# Patient Record
Sex: Male | Born: 1940 | Race: White | Hispanic: No | Marital: Married | Smoking: Former smoker
Health system: Southern US, Community
[De-identification: ages and names within clinical notes are randomized; demographics above are authoritative.]

## PROBLEM LIST (undated history)

## (undated) DIAGNOSIS — I350 Nonrheumatic aortic (valve) stenosis: Secondary | ICD-10-CM

## (undated) DIAGNOSIS — E785 Hyperlipidemia, unspecified: Secondary | ICD-10-CM

## (undated) DIAGNOSIS — Q249 Congenital malformation of heart, unspecified: Secondary | ICD-10-CM

## (undated) DIAGNOSIS — Z87442 Personal history of urinary calculi: Secondary | ICD-10-CM

## (undated) DIAGNOSIS — D039 Melanoma in situ, unspecified: Secondary | ICD-10-CM

## (undated) DIAGNOSIS — M1990 Unspecified osteoarthritis, unspecified site: Secondary | ICD-10-CM

## (undated) DIAGNOSIS — Z8546 Personal history of malignant neoplasm of prostate: Secondary | ICD-10-CM

## (undated) DIAGNOSIS — I639 Cerebral infarction, unspecified: Secondary | ICD-10-CM

## (undated) DIAGNOSIS — D229 Melanocytic nevi, unspecified: Secondary | ICD-10-CM

## (undated) DIAGNOSIS — C439 Malignant melanoma of skin, unspecified: Secondary | ICD-10-CM

## (undated) HISTORY — DX: Melanoma in situ, unspecified: D03.9

## (undated) HISTORY — DX: Hyperlipidemia, unspecified: E78.5

## (undated) HISTORY — DX: Malignant melanoma of skin, unspecified: C43.9

## (undated) HISTORY — PX: TONSILLECTOMY: SUR1361

## (undated) HISTORY — PX: JOINT REPLACEMENT: SHX530

## (undated) HISTORY — DX: Personal history of malignant neoplasm of prostate: Z85.46

## (undated) HISTORY — PX: COLONOSCOPY: SHX174

## (undated) HISTORY — DX: Melanocytic nevi, unspecified: D22.9

## (undated) HISTORY — PX: TOTAL KNEE ARTHROPLASTY: SHX125

---

## 1976-03-31 HISTORY — PX: VASECTOMY: SHX75

## 1999-04-01 DIAGNOSIS — C439 Malignant melanoma of skin, unspecified: Secondary | ICD-10-CM

## 1999-04-01 HISTORY — PX: OTHER SURGICAL HISTORY: SHX169

## 1999-04-01 HISTORY — DX: Malignant melanoma of skin, unspecified: C43.9

## 1999-04-23 DIAGNOSIS — D039 Melanoma in situ, unspecified: Secondary | ICD-10-CM

## 1999-04-23 HISTORY — DX: Melanoma in situ, unspecified: D03.9

## 1999-05-07 ENCOUNTER — Ambulatory Visit (HOSPITAL_COMMUNITY): Admission: RE | Admit: 1999-05-07 | Discharge: 1999-05-07 | Payer: Self-pay | Admitting: *Deleted

## 2000-06-09 ENCOUNTER — Ambulatory Visit (HOSPITAL_COMMUNITY): Admission: RE | Admit: 2000-06-09 | Discharge: 2000-06-09 | Payer: Self-pay | Admitting: *Deleted

## 2003-04-01 DIAGNOSIS — I639 Cerebral infarction, unspecified: Secondary | ICD-10-CM

## 2003-04-01 DIAGNOSIS — G459 Transient cerebral ischemic attack, unspecified: Secondary | ICD-10-CM

## 2003-04-01 HISTORY — DX: Cerebral infarction, unspecified: I63.9

## 2003-04-01 HISTORY — DX: Transient cerebral ischemic attack, unspecified: G45.9

## 2003-11-10 ENCOUNTER — Emergency Department (HOSPITAL_COMMUNITY): Admission: EM | Admit: 2003-11-10 | Discharge: 2003-11-11 | Payer: Self-pay | Admitting: Emergency Medicine

## 2004-01-01 ENCOUNTER — Inpatient Hospital Stay (HOSPITAL_COMMUNITY): Admission: EM | Admit: 2004-01-01 | Discharge: 2004-01-03 | Payer: Self-pay | Admitting: Emergency Medicine

## 2004-01-02 ENCOUNTER — Encounter (INDEPENDENT_AMBULATORY_CARE_PROVIDER_SITE_OTHER): Payer: Self-pay | Admitting: Cardiology

## 2004-01-30 ENCOUNTER — Ambulatory Visit (HOSPITAL_COMMUNITY): Admission: RE | Admit: 2004-01-30 | Discharge: 2004-01-30 | Payer: Self-pay | Admitting: Emergency Medicine

## 2004-02-15 ENCOUNTER — Ambulatory Visit (HOSPITAL_COMMUNITY): Admission: RE | Admit: 2004-02-15 | Discharge: 2004-02-15 | Payer: Self-pay | Admitting: Cardiovascular Disease

## 2004-07-12 ENCOUNTER — Ambulatory Visit (HOSPITAL_COMMUNITY): Admission: RE | Admit: 2004-07-12 | Discharge: 2004-07-12 | Payer: Self-pay | Admitting: Family Medicine

## 2004-07-23 ENCOUNTER — Encounter: Admission: RE | Admit: 2004-07-23 | Discharge: 2004-07-23 | Payer: Self-pay | Admitting: Family Medicine

## 2006-03-31 DIAGNOSIS — Z8546 Personal history of malignant neoplasm of prostate: Secondary | ICD-10-CM

## 2006-03-31 HISTORY — DX: Personal history of malignant neoplasm of prostate: Z85.46

## 2006-03-31 HISTORY — PX: PROSTATECTOMY: SHX69

## 2006-11-16 ENCOUNTER — Inpatient Hospital Stay (HOSPITAL_COMMUNITY): Admission: RE | Admit: 2006-11-16 | Discharge: 2006-11-17 | Payer: Self-pay | Admitting: Urology

## 2006-11-16 ENCOUNTER — Encounter: Payer: Self-pay | Admitting: Urology

## 2007-04-01 HISTORY — PX: KNEE ARTHROSCOPY: SUR90

## 2008-05-29 ENCOUNTER — Encounter: Admission: RE | Admit: 2008-05-29 | Discharge: 2008-05-29 | Payer: Self-pay | Admitting: Family Medicine

## 2010-03-31 HISTORY — PX: TOTAL KNEE ARTHROPLASTY: SHX125

## 2010-08-05 ENCOUNTER — Other Ambulatory Visit: Payer: Self-pay | Admitting: Orthopedic Surgery

## 2010-08-05 ENCOUNTER — Ambulatory Visit (HOSPITAL_COMMUNITY)
Admission: RE | Admit: 2010-08-05 | Discharge: 2010-08-05 | Disposition: A | Discharge status: Home / Self Care | Payer: Medicare Other | Source: Ambulatory Visit | Attending: Orthopedic Surgery | Admitting: Orthopedic Surgery

## 2010-08-05 ENCOUNTER — Encounter (HOSPITAL_COMMUNITY): Payer: Medicare Other

## 2010-08-05 ENCOUNTER — Other Ambulatory Visit (HOSPITAL_COMMUNITY): Payer: Self-pay | Admitting: Orthopedic Surgery

## 2010-08-05 DIAGNOSIS — Z8546 Personal history of malignant neoplasm of prostate: Secondary | ICD-10-CM | POA: Insufficient documentation

## 2010-08-05 DIAGNOSIS — M898X9 Other specified disorders of bone, unspecified site: Secondary | ICD-10-CM | POA: Insufficient documentation

## 2010-08-05 DIAGNOSIS — Z01812 Encounter for preprocedural laboratory examination: Secondary | ICD-10-CM | POA: Insufficient documentation

## 2010-08-05 DIAGNOSIS — Z87891 Personal history of nicotine dependence: Secondary | ICD-10-CM | POA: Insufficient documentation

## 2010-08-05 DIAGNOSIS — Z01818 Encounter for other preprocedural examination: Secondary | ICD-10-CM

## 2010-08-05 DIAGNOSIS — M1710 Unilateral primary osteoarthritis, unspecified knee: Secondary | ICD-10-CM | POA: Insufficient documentation

## 2010-08-05 LAB — URINALYSIS, ROUTINE W REFLEX MICROSCOPIC
Bilirubin Urine: NEGATIVE
Glucose, UA: NEGATIVE mg/dL
Hgb urine dipstick: NEGATIVE
Protein, ur: NEGATIVE mg/dL
Specific Gravity, Urine: 1.014 (ref 1.005–1.030)

## 2010-08-05 LAB — CBC
Hemoglobin: 14.3 g/dL (ref 13.0–17.0)
MCH: 34.6 pg — ABNORMAL HIGH (ref 26.0–34.0)
MCV: 100.7 fL — ABNORMAL HIGH (ref 78.0–100.0)

## 2010-08-05 LAB — COMPREHENSIVE METABOLIC PANEL
CO2: 30 mEq/L (ref 19–32)
Chloride: 102 mEq/L (ref 96–112)
GFR calc Af Amer: >60 mL/min (ref >60)
Potassium: 4.2 mEq/L (ref 3.5–5.1)
Total Bilirubin: 0.5 mg/dL (ref 0.3–1.2)

## 2010-08-05 LAB — SURGICAL PCR SCREEN: MRSA, PCR: NEGATIVE

## 2010-08-05 LAB — APTT: aPTT: 32 seconds (ref 24–37)

## 2010-08-12 ENCOUNTER — Inpatient Hospital Stay (HOSPITAL_COMMUNITY)
Admission: RE | Admit: 2010-08-12 | Discharge: 2010-08-15 | DRG: 470 | Disposition: A | Discharge status: Home Health | Payer: Medicare Other | Source: Ambulatory Visit | Attending: Orthopedic Surgery | Admitting: Orthopedic Surgery

## 2010-08-12 DIAGNOSIS — E785 Hyperlipidemia, unspecified: Secondary | ICD-10-CM | POA: Diagnosis present

## 2010-08-12 DIAGNOSIS — E871 Hypo-osmolality and hyponatremia: Secondary | ICD-10-CM | POA: Diagnosis not present

## 2010-08-12 DIAGNOSIS — Z85820 Personal history of malignant melanoma of skin: Secondary | ICD-10-CM

## 2010-08-12 DIAGNOSIS — Z01812 Encounter for preprocedural laboratory examination: Secondary | ICD-10-CM

## 2010-08-12 DIAGNOSIS — D649 Anemia, unspecified: Secondary | ICD-10-CM | POA: Diagnosis not present

## 2010-08-12 DIAGNOSIS — Z8673 Personal history of transient ischemic attack (TIA), and cerebral infarction without residual deficits: Secondary | ICD-10-CM

## 2010-08-12 DIAGNOSIS — Z8546 Personal history of malignant neoplasm of prostate: Secondary | ICD-10-CM

## 2010-08-12 DIAGNOSIS — M1710 Unilateral primary osteoarthritis, unspecified knee: Principal | ICD-10-CM | POA: Diagnosis present

## 2010-08-12 LAB — TYPE AND SCREEN: ABO/RH(D): O POS

## 2010-08-13 LAB — BASIC METABOLIC PANEL
BUN: 9 mg/dL (ref 6–23)
Calcium: 7.9 mg/dL — ABNORMAL LOW (ref 8.4–10.5)
Creatinine, Ser: 0.77 mg/dL (ref 0.4–1.5)
GFR calc Af Amer: >60 mL/min (ref >60)
GFR calc non Af Amer: >60 mL/min (ref >60)
Glucose, Bld: 133 mg/dL — ABNORMAL HIGH (ref 70–99)
Potassium: 3.8 mEq/L (ref 3.5–5.1)

## 2010-08-13 LAB — CBC
HCT: 31.4 % — ABNORMAL LOW (ref 39.0–52.0)
Hemoglobin: 10.7 g/dL — ABNORMAL LOW (ref 13.0–17.0)
MCH: 34.5 pg — ABNORMAL HIGH (ref 26.0–34.0)
MCHC: 34.1 g/dL (ref 30.0–36.0)
Platelets: 128 10*3/uL — ABNORMAL LOW (ref 150–400)
WBC: 10 10*3/uL (ref 4.0–10.5)

## 2010-08-13 NOTE — Op Note (Signed)
Jeffrey Ewing, Jeffrey Ewing               ACCOUNT NO.:  [id number]   MEDICAL RECORD NO.:  [id number]          PATIENT TYPE:  INP   LOCATION:  1430                         FACILITY:  [facility]   PHYSICIAN:  Excell Seltzer. Annabell Howells, M.D.    DATE OF BIRTH:  19-Jan-1941   DATE OF PROCEDURE:  11/16/2006  DATE OF DISCHARGE:                               OPERATIVE REPORT   PROCEDURE:  Robotic radical prostatectomy with bilateral pelvic lymph  node dissection and lysis of adhesions.   PREOPERATIVE DIAGNOSIS:  T1C Gleason 7 adenocarcinoma of prostate.   POSTOPERATIVE DIAGNOSIS:  T1C Gleason 7 adenocarcinoma of prostate.   SURGEON:  Dr. Bjorn Pippin.   ASSISTANT:  Dr. Crecencio Mc.   ANESTHESIA:  General.   SPECIMEN:  Prostate and seminal vesicle along with bilateral pelvic  lymph nodes.   DRAINS:  20-French Foley catheter and #10 Blake drain.   ESTIMATED BLOOD LOSS:  300 mL.   COMPLICATIONS:  None.   INDICATIONS:  Jeffrey Ewing is a 70 year old white male who was referred  for an elevated PSA.  He was found on biopsy and Gleason 7  adenocarcinoma of prostate involving the right and left base and the  left apex.  After discussion of the treatment options he elected radical  prostatectomy.   FINDINGS AND PROCEDURE:  The patient had undergone preoperative physical  therapy training as well as preoperative education via hospital.  He was  taken to operating room where he was given a gram of Ancef.  PAS hose  were placed.  He was placed on the operating table in the supine  position and general anesthetic was induced.  He was then moved into  lithotomy position with great care taken to pad all pressure points.  His abdomen was shaved and a red rubber rectal catheter was placed per  routine.  He was prepped with Betadine solution and then placed in steep  Trendelenburg per the routine positioning.  He was then draped in the  usual sterile fashion with an Ioban drape used to secure the remaining  drapes.   At  this point the periumbilical camera port was located by measuring 18  cm superior to the pubis just to the left side of the umbilicus a 2 cm  incision was made and the subcutaneous fat was spread with hemostats  following placement [employer] retractor to expose the anterior rectus  sheath.  This was opened with the Bovie.  The rectus muscle was parted  with a hemostat exposing the posterior sheath which was nicked with  scalpel.  A hemostat was then passed through the posterior sheath and  spread enlarging the opening in the peritoneal cavity.  A finger was  then placed to confirm adequate entry into the peritoneal cavity.  A 12  mm laparoscopy trocar was then placed and the incision was secured  around the trocar with a figure-of-eight 0 Vicryl.  The abdomen was then  insufflated at low volume fill, once insufflation was noted to proceed  well, the gas was increased to high flow.   At this point the three remaining robot port  sites and the 5 and 12 mm  working ports were then marked in the standard configuration.  After  initial placement of the camera prior to insertion of the ports  adhesions were noted in the left lower quadrant that were going to  impede placement of the left lateral robot port, so once the right ports  had been placed, the adhesions were taken down using sharp dissection.  The left ports were then inserted and at this point the robot was docked  in the routine fashion.   Once all ports were in position, and the robot was docked, the bladder  was filled with approximately 200 mL of sterile fluid and dissection was  initiated with division of the obliterated umbilical arteries and  incision in the anterior peritoneum.  The bladder was dissected off the  anterior abdominal wall entering the retropubic space.  The bladder was  then drained and the prostate was exposed. Initially the anterior  prostate was defatted.  We then turned our attention to the right   endopelvic fascia which was incised lateral to the prostate from the  base to apex.  The prostate was then dissected off the pelvic sidewall  out to the dorsal vein complex.  The puboprostatic ligaments were  divided using cautery and sharp dissection.  Once the right lateral  dissection was completed, we turned our attention to the left side of  where the endopelvic fascia was incised.  The prostate was dissected off  the lateral pelvic sidewall and the puboprostatic ligaments were  divided, allowing exposure of the dorsal vein complex.  Once appropriate  exposure of the dorsal vein complex had been achieved, the Endo-GIA was  used to divide the dorsal vein complex in a standard fashion.  Care was  taken to avoid urethral injury by manipulation of Foley throughout this  procedure.   Once the dorsal vein complex had been divided.  We turned our attention  to the bladder neck.  The Foley catheter balloon was used to aid  identification of the bladder neck which was then incised the using  cautery scissors.  Once the Foley catheter was exposed, it was brought  into the wound and used to provide anterior traction on the prostate.  The posterior bladder neck was then divided and dissection was carried  down to the ampulla of the vas with great care being taken to avoid  excessive thinness at the bladder neck posteriorly.  Once the vasa were  identified, the right vas was identified and dissected out and divided,  followed by the left vas.  These were then used to provide anterior  traction with the fourth arm.  The left seminal vesicle was dissected  out using cautery dissection.  This was followed by the light seminal  vesicles.  Once the seminal vesicles had been dissected out and grasped  with the fourth arm, Denonvilliers fascia was incised allowing  development of the space between the rectum and the prostate.  This  space was developed out to the apex and to the lateral aspects of the   prostate using the Prograsp.   Once this dissection then completed, we turned our attention to the  nerve-sparing component of the procedure.  The right nerve spare was  performed initially with incision of the periprostatic fascia allowing  development of plane between the neurovascular bundle and the prostate,  some large veins were encountered and one arterial bleeder was  controlled with bipolar but we were able to  successfully develop the  plane between the neurovascular bundle on the prostate from the apex  back to the base of the prostate.  This process was then repeated on the  left with a successful the neurovascular bundle dissection.   At this point the left prostatic pedicle was taken down using large Hem-  o-lok clips and residual apical attachments were divided down to the  urethra.  We then took down the right pedicle using Hem-o-lok clips with  care taken as on the left to avoid the neurovascular bundle.  Once again  the remaining apical attachments were taken down sharply to the apex of  the prostate.  The patient been given indigo carmine during the  procedure and blue urine was noted to efflux from the bladder neck area  as expected   At this point the residual dorsal vein complex was divided using the  cautery.  The urethra was divided anteriorly with sharp dissection, the  Foley was identified and withdrawn allowing division the posterior  urethra. Some residual rectourethralis attachments were taken down  apically and the prostate was freed from the pelvic fossa and moved out  of the field.  The pelvis was irrigated and the rectum was insufflated,  no evidence of rectal injury was identified.  Inspection of the pelvic  floor revealed no obvious significant bleeding.   At this point we turned our attention to the lymph node dissection.  The  left nodes were approached initially.  The fourth arm was used to divide  medial traction on the bladder while the  __________  dissector and  cautery scissors were used to perform the node dissection.  The iliac  vein was identified and the node package was developed off the iliac  vein out to the pelvic sidewall.  It was then swept medially allowing  identification of the obturator nerve.  Once the packet had been freed  from the sidewall, clips were placed on the proximal portion of the  lymphatic package and then dissection was carried down to the  bifurcation of the iliac artery where clips were used to control the  distal aspect of the packet.  The packet was then removed using a Super  grasper.  Inspection revealed no injury to the obturator nerve and no  active bleeding.   We then turned our attention to the right lymph node dissection.  The  peritoneum was incised more laterally allowing the more thorough  exposure of the obturator fossa.  The iliac vein was identified.  Once  again the packet was developed, the obturator nerve was identified, the  proximal portion of the packet was controlled with clips.  The  dissection was then carried down to the bifurcation of iliacs and clips  were placed on the distal portion of the packet.  The packet was then  removed once again with the Super grasper.  Inspection revealed an  intact obturator nerve and no active bleeding.   At this point we turned our attention to the urethral anastomosis.  Initially a 3-0 Vicryl was used to secure the rectourethralis muscle to  the cut edge of Denonvilliers fascia to relieve tension on the bladder  neck and reposition the proximal urethra back more superiorly within the  pelvis.  Once this stitch was secured, a second 3-0 Vicryl was placed  between the posterior urethra and the posterior bladder neck to provide  initial approximation.  Once this stitch was positioned, a 4-0 Monocryl  composed of both purple  and brown suture material was brought onto the  field.  The purple end was placed posteriorly using a double  throw to  secure the knot against the bladder neck tissue then a running  anastomosis was performed to complete the left side the anastomosis. The  remaining Monocryl strand was then run on the right side to complete the  anastomosis.  Once the stitches had been placed they were pulled up to  ensure a watertight anastomosis and a fresh 20-French coude Foley  catheter was inserted without difficulty into the bladder.  The suture  was then tied and not trimmed and the Foley balloon was filled.  Irrigation at this point revealed a watertight anastomosis.   At this point a #10 Blake drain was placed through the fourth right arm  port and port was removed.   The robot was undocked, an entrapment sac was placed through the camera  port while the camera was placed through the 12-mm working port and  grasping forceps was used to place the specimen within the entrapment  sac.  Specimen placed in the entrapment sac.  The camera port was then  reinserted alongside the entrapment sac string and the camera was  replaced in the camera port and the 12-mm working port was then removed  and closed using a suture passer and 2-0 Vicryl suture.  Once this was  secured, the remaining working ports removed under direct vision.  The  camera and camera port were then removed.  The camera port site was then  opened sufficiently to allow removal of the specimen and the anterior  rectus fascia was then closed using a running 0 Vicryl suture.  Once  this incision had been closed, all of the port sites were infiltrated  with lidocaine and closed with skin clips.  The drain was secured to the  skin using a 3-0 nylon suture.   At this point the Foley catheter was irrigated with return of clear  irrigant and no clots.  The catheter was placed to straight drainage.  The drapes were removed and dressings were applied to the port site.  The drain was placed to bulb suction.  The patient was taken down from  the  lithotomy position.  His anesthetic was reversed.  He was removed to  the recovery room in stable condition.  There were no complications  during the procedure.      Excell Seltzer. Annabell Howells, M.D.  Electronically Signed     JJW/MEDQ  D:  11/16/2006  T:  11/17/2006  Job:  [id number]

## 2010-08-13 NOTE — Op Note (Signed)
Jeffrey Ewing, Jeffrey Ewing               ACCOUNT NO.:  [id number]   MEDICAL RECORD NO.:  [id number]          PATIENT TYPE:  INP   LOCATION:  1430                         FACILITY:  [facility]   PHYSICIAN:  Excell Seltzer. Annabell Howells, M.D.    DATE OF BIRTH:  10/05/1940   DATE OF PROCEDURE:  11/16/2006  DATE OF DISCHARGE:                               OPERATIVE REPORT   PROCEDURE:  Robotic radical prostatectomy with bilateral pelvic lymph  node dissection and lysis of adhesions.   PREOPERATIVE DIAGNOSIS:  Prostate cancer.   POSTOPERATIVE DIAGNOSIS:  Prostate cancer.   SURGEON:  Dr. Bjorn Pippin.   ASSISTANT:  Dr. Crecencio Mc.   ANESTHESIA:  General.   SPECIMENS:  Prostate, seminal vesicles and bilateral pelvic lymph nodes.   DRAINS:  20-French coude Foley catheter and #10 Blake drain.   ESTIMATED BLOOD LOSS:  300 mL.   COMPLICATIONS:  Dictation ended at this point.      Excell Seltzer. Annabell Howells, M.D.     JJW/MEDQ  D:  11/16/2006  T:  11/17/2006  Job:  [id number]

## 2010-08-14 LAB — BASIC METABOLIC PANEL
BUN: 6 mg/dL (ref 6–23)
Calcium: 8 mg/dL — ABNORMAL LOW (ref 8.4–10.5)
Chloride: 101 mEq/L (ref 96–112)
Creatinine, Ser: 0.96 mg/dL (ref 0.4–1.5)
GFR calc Af Amer: >60 mL/min (ref >60)
GFR calc non Af Amer: >60 mL/min (ref >60)
Glucose, Bld: 118 mg/dL — ABNORMAL HIGH (ref 70–99)
Potassium: 3.9 mEq/L (ref 3.5–5.1)
Sodium: 134 mEq/L — ABNORMAL LOW (ref 135–145)

## 2010-08-14 LAB — CBC
Hemoglobin: 9.5 g/dL — ABNORMAL LOW (ref 13.0–17.0)
MCH: 34.9 pg — ABNORMAL HIGH (ref 26.0–34.0)
MCHC: 34.2 g/dL (ref 30.0–36.0)
MCV: 102.2 fL — ABNORMAL HIGH (ref 78.0–100.0)
RBC: 2.72 MIL/uL — ABNORMAL LOW (ref 4.22–5.81)

## 2010-08-14 NOTE — Op Note (Signed)
NAMEMOYSES, PAVEY               ACCOUNT NO.:  [id number]  MEDICAL RECORD NO.:  [id number]           PATIENT TYPE:  I  LOCATION:  0011                         FACILITY:  [facility]  PHYSICIAN:  Ollen Gross, M.D.    DATE OF BIRTH:  1941/01/20  DATE OF PROCEDURE:  08/12/2010 DATE OF DISCHARGE:                              OPERATIVE REPORT   PREOPERATIVE DIAGNOSIS:  Osteoarthritis, left knee.  POSTOPERATIVE DIAGNOSIS:  Osteoarthritis, left knee.  PROCEDURE:  Left total knee arthroplasty.  SURGEON:  Ollen Gross, M.D.  ASSISTANT:  Alexzandrew L. Perkins, P.A.C.  ANESTHESIA:  Spinal.  ESTIMATED BLOOD LOSS:  Minimal.  DRAIN:  Hemovac x1.  TOURNIQUET TIME:  39 minutes at 300 mmHg.  COMPLICATIONS:  None.  CONDITION:  Stable to recovery room.  BRIEF CLINICAL NOTE:  [name] is a 70 year old male with advanced end- stage arthritis of the left knee with progressively worsening pain and dysfunction.  He has failed nonoperative management and presents for total knee arthroplasty.  PROCEDURE IN DETAIL:  After successful administration of spinal anesthetic, a tourniquet was placed high on his left thigh and his left lower extremity was prepped and draped in the usual sterile fashion. Extremity was wrapped in Esmarch, knee flexed, tourniquet inflated to 300 mmHg.  Midline incision was made with a #10 blade through the subcutaneous tissue to the level of the extensor mechanism.  A fresh blade was used make a medial parapatellar arthrotomy.  Soft tissue on the proximal medial tibia subperiosteally elevated to the joint line with the knife and into the semimembranosus bursa with a Cobb elevator. Soft tissue laterally was elevated with attention being paid to avoid patellar tendon on tibial tubercle.  The patella was everted, knee flexed 90 degrees, and ACL and PCL removed.  Drill was used create a starting hole in the distal femur and the canal thoroughly irrigated.  A 5-degree  left valgus alignment guide was placed and distal femoral cutting block pinned to remove 11 mm off the distal femur.  Resection was made with an oscillating saw.  The tibia subluxed forward and the menisci were removed.  Extramedullary tibial alignment guide was placed referencing proximally at the medial aspect of the tibial tubercle and distally along the second metatarsal axis and tibial crest.  The block was pinned to remove 2 mm off the more deficient medial side.  Tibial resection was made with an oscillating saw.  Size 4 was the most appropriate tibial component.  The proximal tibia was prepared with a modular drill and keel punch for the size 4.  Femoral sizing guide was placed, size 4 was most appropriate on the femur.  Rotations was marked off the epicondylar axis and the rotation was confirmed by creating a rectangular flexion gap at 90 degrees.  The block was pinned in this rotation.  The anterior-posterior chamfer cuts were then made.  Intercondylar block was placed and that cut was made. The trial size 4 posterior stabilized femur was placed.  A 10-mm posterior stabilized rotating platform insert trial was placed.  There was a tiny bit of play with the 10, so went to  12.5 which allowed for full extension with excellent varus-valgus and anterior-posterior balance throughout full range of motion.  The patella was everted and thickness measured to 24 mm.  Freehand resection was taken to 14 mm, 38 template was placed, lug holes were drilled, trial patella was placed and it tracked normally.  Osteophytes were removed off the posterior femur with the trial in place.  All trials were removed and the cut bone surfaces prepared with pulsatile lavage.  Cement was mixed and once ready for implantation, size 4 mobile bearing tibial tray, size 4 posterior stabilized femur and 38 patella were cemented into place.  The patella was held with a clamp.  Trial 12.5-mm insert was placed,  knee held in full extension, all extruded cement removed.  When the cement had fully hardened, then the permanent 12.5-mm posterior stabilized rotating platform insert was placed in tibial tray.  Wound was copiously irrigated with saline solution and the arthrotomy closed over Hemovac drain with interrupted #1 PDS.  Flexion against gravity was 135 degrees, patella tracks normally.  Tourniquet was released after total time 39 minutes.  Subcu was closed with interrupted 2-0 Vicryl, subcuticular running 4-0 Monocryl.  Catheter for Marcaine pain pump was placed and pump initiated.  Incisions cleaned and dried and Steri-Strips and bulky sterile dressing were applied.  He was then placed into a knee immobilizer, awakened and transported to recovery in stable condition.     Ollen Gross, M.D.     FA/MEDQ  D:  08/12/2010  T:  08/12/2010  Job:  [id number]  Electronically Signed by Ollen Gross M.D. on 08/14/2010 10:13:46 AM

## 2010-08-14 NOTE — H&P (Addendum)
NAMEMARKEITH, [name]               ACCOUNT NO.:  [id number]  MEDICAL RECORD NO.:  [id number]           PATIENT TYPE:  I  LOCATION:  1621                         FACILITY:  [facility]  PHYSICIAN:  Ollen Gross, M.D.    DATE OF BIRTH:  [birth year]  DATE OF ADMISSION:  08/12/2010 DATE OF DISCHARGE:  04/19/2010                             HISTORY & PHYSICAL   CHIEF COMPLAINT:  Left knee pain.  BRIEF HISTORY:  Jeffrey Ewing came in to see Dr. Lequita Halt as a second opinion in February.  He states that he was then having worsening pain in the left knee for about 4 years.  He has had injections in the past including cortisone and Euflexxa and he actually has pain in both knees, the left is more symptomatic than the right.  At this point, he states the left knee is limiting what he is able to do and he now presents for a left total knee arthroplasty.  MEDICATION ALLERGIES:  No known drug allergies.  PRIMARY CARE PHYSICIAN:  Jeffrey Ewing, M.D.  UROLOGIST:  Jeffrey Ewing, M.D.  CURRENT MEDICATIONS: 1. Aggrenox which he will discontinue 5 days prior to surgery. 2. Crestor. 3. Ibuprofen. 4. Xanax.  PAST MEDICAL HISTORY: 1. End-stage arthritis of both knees, left worse than right. 2. History of TIAs in 2005. 3. Hyperlipidemia. 4. History of kidney stones in 2005 and again in 2011. 5. History of prostate cancer.  He had surgery in 2008. 6. History of melanoma. 7. Arthritis.  PAST SURGICAL HISTORY:  Prostate cancer surgery, July of 2008.  FAMILY HISTORY:  Father passed at age of 60, he had coronary occlusion. Mother passed at age of 80, she had Alzheimer's and then had complications from a fall.  SOCIAL HISTORY:  The patient is married.  He is retired.  He admits past use of tobacco products.  He does chew Nicorette gum regularly.  He has 3 children.  He lives at home with his wife.  He does plan to go home following his hospital stay and wife is lined up to be his caregiver.  REVIEW  OF SYSTEMS:  GENERAL:  Negative for fevers, chills or weight change.  HEENT/NEURO:  Negative for headache or blurred vision. DERMATOLOGIC:  Negative for rash or lesion.  RESPIRATORY:  Negative for shortness of breath at rest or with exertion.  CARDIOVASCULAR:  Negative for chest pain or palpitations.  GI:  Negative for nausea, vomiting or diarrhea.  GU:  Negative for hematuria, dysuria.  Does have history of kidney stones.  MUSCULOSKELETAL:  Positive for joint pain, muscular pain and muscle weakness.  The patient has been cleared for surgery by his primary care physician Dr. Elias Else.  PHYSICAL EXAMINATION:  VITAL SIGNS:  Pulse 80, respirations 18, blood pressure 120/78 in the left arm. GENERAL:  Jeffrey Ewing is alert and oriented x3.  He is well-developed, well-nourished, in no apparent distress.Marland Kitchen  He is a pleasant 70 year old male who is at a stated height of 5 feet 10 inches and stated weight of 196 pounds. HEENT:  Normocephalic, atraumatic.  Extraocular movements intact.  The patient is wearing  glasses. NECK:  Supple.  Full range of motion without lymphadenopathy. CHEST:  Lungs are clear to auscultation bilaterally without wheezes, rhonchi or rales. HEART:  Regular rate and rhythm without murmur. ABDOMEN:  Bowel sounds present in all 4 quadrants.  Abdomen is soft and nontender to palpation. EXTREMITIES:  Left knee negative for effusion, slight varus deformity, tender along the medial joint line.  Range is 5 to 125 degrees.  Marked crepitus is noted throughout the range of motion. SKIN:  Unremarkable. NEUROLOGIC:  Intact.  Peripheral vascular, carotid pulses 2+ bilaterally without bruit.  RADIOGRAPHS:  AP and lateral views of the left knee reveal bone-on-bone in medial compartment with patellofemoral changes, much worse in the left than the right knee.  IMPRESSION:  End-stage arthritis of both knees, worse on the left than the right.  PLAN:  Left total knee  arthroplasty.     Rozell Searing, PAC   ______________________________ Ollen Gross, M.D.    LD/MEDQ  D:  08/13/2010  T:  08/13/2010  Job:  [id number]  Electronically Signed by Ollen Gross M.D. on 08/14/2010 10:13:50 AM Electronically Signed by Rozell Searing  on 08/15/2010 10:43:48 AM

## 2010-08-15 LAB — CBC
MCHC: 34.1 g/dL (ref 30.0–36.0)
RBC: 2.77 MIL/uL — ABNORMAL LOW (ref 4.22–5.81)
RDW: 13.4 % (ref 11.5–15.5)
WBC: 12.3 10*3/uL — ABNORMAL HIGH (ref 4.0–10.5)

## 2010-08-15 LAB — BASIC METABOLIC PANEL
CO2: 27 mEq/L (ref 19–32)
Calcium: 8.1 mg/dL — ABNORMAL LOW (ref 8.4–10.5)
Chloride: 99 mEq/L (ref 96–112)
GFR calc Af Amer: >60 mL/min (ref >60)
Glucose, Bld: 125 mg/dL — ABNORMAL HIGH (ref 70–99)
Potassium: 3.7 mEq/L (ref 3.5–5.1)
Sodium: 131 mEq/L — ABNORMAL LOW (ref 135–145)

## 2010-08-16 NOTE — H&P (Signed)
[name], [name]               ACCOUNT NO.:  [id number]   MEDICAL RECORD NO.:  [id number]          PATIENT TYPE:  INP   LOCATION:  1823                         FACILITY:  [facility]   PHYSICIAN:  Pramod P. Pearlean Brownie, MD    DATE OF BIRTH:  11-17-1940   DATE OF ADMISSION:  01/01/2004  DATE OF DISCHARGE:                                HISTORY & PHYSICAL   REFERRING PHYSICIAN:  Lorre Ewing, M.D.   REASON FOR ADMISSION:  TIA.   HISTORY OF PRESENT ILLNESS:  [name] is a 70 year old Caucasian male who  woke up this morning not feeling right, and weak.  The patient himself  denies specific focal weakness.  However, his wife, who was available at the  bedside, states she noticed that he could not make a grip with his left hand  and when he was getting dressed he was unable to move his left foot to dress  himself.  He was also off-balance and needed some help to walk.  His  symptoms resolved quickly.  By the time she got her car out and brought the  patient to the hospital, he was able to walk all right without assistance  and move his left hand as well.  The patient states he has had some physical  exertion in the last few weeks as they have recently moved into a new house  and has often woke up not feeling well and feeling weak, but today was  different.  He denies any slurred speech, headache, blurred vision, vertigo,  or numbness.  He has a previous history of ocular infarct 5 years ago and he  developed some visual loss in the right eye.  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  He had some mild right eye scotoma at the 11 o'clock  position as a residue from that.  He was placed on aspirin, as well as  started on Foltx for stroke prevention.  He had been taking them regularly  but had recently stopped the Foltx because of the move.   PAST MEDICAL HISTORY:  Fairly unremarkable except for hyperlipidemia and  smoking for 30+ years.   MEDICATION ALLERGIES:  None.   HOME MEDICATIONS:  1.  Aspirin 325 mg a day.  2.  Lipitor 20 mg a day.  3.  Foltx once a day, but recently discontinued.   REVIEW OF SYSTEMS:  Not significant for any recent chest pain, shortness of  breath, cough, diarrhea, weight loss, weight gain, decreased appetite.  His  family physician is Dr. Nicholos Johns with [facility].   SOCIAL HISTORY:  The patient is married, lives with his wife in [place].  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  He smokes three packs per week for 30+ years, does not drink.   PHYSICAL EXAMINATION:  GENERAL:  Reveals a pleasant Caucasian male who is  not in distress.  VITAL SIGNS:  He is afebrile, pulse rate 78 per minute, regular respirations  rate.  Distal  pulses well felt, blood pressure 130/98, and no extremity  vascular deformity noted.  HEENT:  Head is nontraumatic.  Neck is supple without bruit.  ENT exam  unremarkable.  CARDIAC:  No murmur or gallop.  LUNGS:  Clear to auscultation.  NEUROLOGIC:  The patient is awake, alert, oriented x3, with normal speech  and language function.  There is no aphasia, apraxia, or dysarthria.  Pupils  are equal, reactive to light and accommodation.  Face is symmetric, palate  elevates normally, tongue is midline.  The patient has only a small visual  scotoma in the right eye at the 11 o'clock position but has good visual  acuity in fields.  Motor system exam reveals symmetric strength, tone,  reflexes, coordination, sensation.  Finger-to-nose coordination is accurate.  Gait was not tested.   DATA REVIEW:  Noncontrast CAT scan of the head done today is normal without  any acute abnormality.  EKG reveals normal sinus rhythm with any acute  ischemic findings.  Admission labs are pending.   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   PLAN:  The patient will be admitted to the stroke service for stroke risk  stratification evaluation.  We will discontinue aspirin and change him to  Aggrenox one capsule daily for the first 2 weeks, to be increased to twice a  day.  Use Tylenol p.r.n. for headache if necessary.  Obtain MRI scan of the  brain with MRA of the brain.  The patient is claustrophobic and hence will  sedate him with Ativan prior to the MRI.  Check carotid ultrasound,  transcranial Doppler studies, a 2-D echocardiogram, fasting lipid profile,  hemoglobin A1c, homocysteine levels.  Continue Lipitor for his  hyperlipidemia.  I had a long discussion with the patient and his wife  regarding the nature of his symptoms.  Discussed with them the plan for  evaluation, treatment, and answered questions.       PPS/MEDQ  D:  01/01/2004  T:  01/01/2004  Job:  [id number]   cc:   Molly Maduro A. Nicholos Johns, M.D.  [address]  [place]  [place] [address]  Fax: [phone]

## 2010-08-16 NOTE — Discharge Summary (Signed)
Jeffrey Ewing, Jeffrey Ewing               ACCOUNT NO.:  [id number]   MEDICAL RECORD NO.:  [id number]          PATIENT TYPE:  INP   LOCATION:  3019                         FACILITY:  [facility]   PHYSICIAN:  Annie Main, N.P.      DATE OF BIRTH:  1941-03-11   DATE OF ADMISSION:  01/01/2004  DATE OF DISCHARGE:  01/03/2004                                 DISCHARGE SUMMARY   DISCHARGE DIAGNOSES:  1.  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  2.  Smoker.  3.  Dyslipidemia.  4.  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  5.  Mild atrioseptal aneurysm.   MEDICATIONS ON DISCHARGE:  1.  Aggrenox one p.o. daily x14 days then increase to b.i.d.  2.  Lipitor 20 mg daily.  3.  Foltx one daily.   STUDIES PERFORMED:  1.  CT of the head on admission shows no acute abnormality.  2.  EKG reveals normal sinus rhythm without any acute ischemic findings.  3.  MRI shows scattered foci of acute to subacute infarction in the right      occipital region and right posterior parietal region consistent with      embolic disease.  4.  MRA of the brain is negative.  5.  Transthoracic echocardiogram shows EF of 55 to 65%.  Cannot clearly      identify any left ventricular regional wall motion abnormalities.      Possibility of PFO cannot be excluded.  No vegetation noted.  In the      parasternal short axis view, the mitral leaflet appeared somewhat      thickened, but there was not a typical vegetation.  TEE recommended if      clinically relevant.  6.  TEE performed by Dr. Dionicio Stall revealed mild atrioseptal aneurysm      positive, negative jet pad, positive bubble study, likely intracardiac      shunt, likely PFO.  MRI of the heart was recommended to further      delineate anatomy.  7.  Carotid Doppler was normal.  8.  Transcranial Doppler was performed; results pending at time of      discharge.   LABORATORY STUDIES:  Homocystine 8.04, cholesterol 184, triglycerides 144,  HDL 43, LDL 112.   Urine drug screen negative.  CBC normal, differential  normal.  Chemistry normal except for elevated glucose at 126.  Liver  functions normal.  Hemoglobin A1c normal at 6.   HISTORY OF PRESENT ILLNESS:  Mr. Servellon is a 70 year old, right-handed,  white male with a history of dyslipidemia and smoking, who woke the morning  of admission not feeling quite right and weak.  The patient denies specific  focal weakness; however, the wife, who was a nurse and at the bedside,  noticed he could not make a grip with his left hand while he was getting  dressed, and he was unable to move his left foot to dress himself.  He was  off balance and needed some help to walk.  His symptoms resolved quickly.  She put him in the car and  brought him to the hospital, and he was able to  walk in without assistance and move his left hand well.  The patient felt  that he had had some physical exertion in the past few weeks and that that  was what his symptoms were related to.  He does have a history of an ocular  infarct five years and developed some visual loss in the right eye.  He saw  a neurologist at that time and had extensive workup.  He had a mild right  eye scotoma at the 11 o'clock position as a residue from that.  He was  placed on aspirin and started on Foltx.  He has been taking those regularly  along with Lipitor until the past two weeks where he has not taken his Foltx  or Lipitor.  The patient was admitted to the hospital for further stroke  evaluation.   HOSPITAL COURSE:  MRI did reveal two acute infarcts in the right parietal  and right occipital regions.  It looked embolic.  A 2-D echocardiogram was  essentially unrevealing, and a TEE did reveal a questionable PFO with a  positive bubble study and a mild atrioseptal aneurysm.  This was performed  by Dr. Dorethea Clan, who recommended a followup cardiac MRI.  At this time, Dr.  Pearlean Brownie does not prefer to follow with an MRI, but may consider this as an   outpatient.  He will discuss closure with the patient in the future after  doing a followup TCD bubble study in his office.   Other risk factors identified were dyslipidemia for which the patient has  been on Lipitor long term.  He was recently just increased to a 20 mg dose,  but has also skipped Lipitor for the past two weeks without clear-cut  reasoning.  We will go ahead and leave Lipitor at 20 mg a day, follow up in  six to eight weeks, and if remains elevated will increase to 40, though LDL  is less than 100.  The patient is also a smoker, and smoking cessation  counseling was given.  Diet and exercise were also discussed with the  patient.  Current stroke is felt to be secondary to small vessel disease,  not embolic source, per Dr. Pearlean Brownie.   CONDITION AT DISCHARGE:  Patient alert and oriented x3.  Speech clear.  No  aphasia.  Visual fields full.  No arm drift.  Strength is normal.  He has  mild decreased left upper extremity finger tap.  Grip has improved.  He has  no satelliting.   DISCHARGE PLAN:  1.  Discharge home with wife.  No therapies needed.  2.  Aggrenox for secondary stroke prevention.  3.  Lipitor 20 mg daily.  Follow up in six to eight weeks.  If LDL not less      than 100, increase to 40 mg daily.  4.  Transcranial Doppler with bubble study scheduled for October 17th with      Dr. Delia Heady.  5.  Follow up with Dr. Pearlean Brownie in two months.  Need to discuss patent foramen      ovale, question cardiac MRI, question closure, question Coumadin.       SB/MEDQ  D:  01/04/2004  T:  01/04/2004  Job:  [id number]   cc:   Pramod P. Pearlean Brownie, MD  Fax: [phone]   Vida Roller, M.D.  Fax: [phone]   Elana Alm. Nicholos Johns, M.D.  [address]  [place] [address]  Fax: [phone]

## 2010-09-11 NOTE — Discharge Summary (Signed)
NAMEGRACIANO, Jeffrey Ewing               ACCOUNT NO.:  [id number]  MEDICAL RECORD NO.:  [id number]           PATIENT TYPE:  I  LOCATION:  1621                         FACILITY:  [facility]  PHYSICIAN:  Ollen Gross, M.D.    DATE OF BIRTH:  1940-05-30  DATE OF ADMISSION:  08/12/2010 DATE OF DISCHARGE:  08/15/2010                              DISCHARGE SUMMARY   ADMITTING DIAGNOSES: 1. End-stage osteoarthritis, bilateral knees, left worse than right. 2. History of transient ischemic attack. 3. Hyperlipidemia. 4. History of renal calculi. 5. History of prostate cancer. 6. History of melanoma. 7. Osteoarthritis.  DISCHARGE DIAGNOSES: 1. Osteoarthritis, left knee, status post left total knee replacement     arthroplasty. 2. Mild postoperative acute blood loss anemia. 3. Mild postoperative hyponatremia. 4. History of transient ischemic attack. 5. Hyperlipidemia. 6. History of renal calculi. 7. History of prostate cancer. 8. History of melanoma. 9. Osteoarthritis.  PROCEDURE:  Aug 12, 2010, left total knee.  Surgeon, Dr. Lequita Halt. Assistant, Alexzandrew L. Perkins, P.A.C.  Spinal anesthesia.  TOURNIQUET TIME:  39 minutes.  CONSULTS:  None.  BRIEF HISTORY:  The patient is a 70 year old male with advanced arthritis, end-stage, of left knee; progressive worsening pain and dysfunction; failed nonoperative management; now presents for total knee arthroplasty.  LABORATORY DATA:  Preop CBC was not scanned into the chart.  Serial hemoglobins were followed.  Hemoglobin was 10.1 on day #1, down to 9.5 where it stabilized.  Last noted H and H of 9.5 and 27.9.  Chem panel not found in the chart.  Serial BMETs were followed.  Sodium did drop from 135 to 131.  Remaining electrolytes remained within normal limits.  CONSULTS:  None.  HOSPITAL COURSE:  The patient was admitted to [facility], taken to OR, underwent above-stated procedure without complication.  The patient tolerated the  procedure well, later transferred to the recovery room on orthopedic floor, started on p.o. and IV analgesic pain control following surgery.  The Aggrenox for TIAs was on hold.  The patient was placed on Xarelto for DVT prophylaxis, started back on home meds.  Blood pressure was a little soft following surgery, so gave the patient fluids to help out with the pressures.  Pulse was good.  Hemovac drain placed on the surgery was pulled.  Started getting up out of bed, weightbearing as tolerated.  Noted on day #2, the patient had progressed, sodium was down a little bit, hemoglobin was down to 9.5 but asymptomatic.  Had a little bit of temp, encouraged incentive spirometer and pulmonary toilet.  Dressing changed.  Incision looked good.  Hemoglobin was 9.5, put him on iron supplementation.  Continued to slowly progress with therapy and by day #3, hemoglobin was stable at 9.5.  Sodium was down a little bit to 131, would recheck on outpatient basis.  Incision was healing well, progressing with therapy and discharged home. DISCHARGE/PLAN: 1. The patient was discharged home on Aug 15, 2010. 2. Discharge diagnoses please see above. 3. Discharge medications:  Nu-Iron, Robaxin, OxyIR, and Xarelto.     Continue  home meds, Crestor.  DIET:  Heart healthy diet.  ACTIVITY:  Weightbearing as tolerated.  Total knee protocol.  FOLLOWUP:  Two weeks.  DISPOSITION:  Home.  CONDITION ON DISCHARGE:  Improved.     Alexzandrew L. Julien Girt, P.A.C.   ______________________________ Ollen Gross, M.D.    ALP/MEDQ  D:  08/29/2010  T:  08/29/2010  Job:  [id number]  Electronically Signed by Patrica Duel P.A.C. on 09/05/2010 10:42:47 AM Electronically Signed by Ollen Gross M.D. on 09/11/2010 03:23:17 PM

## 2011-01-10 LAB — ABO/RH: ABO/RH(D): O POS

## 2011-01-13 LAB — BASIC METABOLIC PANEL
BUN: 12
CO2: 27
Calcium: 9.9
Chloride: 103
Creatinine, Ser: 1.08
Glucose, Bld: 99
Potassium: 3.9
Sodium: 141

## 2011-08-18 ENCOUNTER — Encounter: Payer: Self-pay | Admitting: Internal Medicine

## 2011-08-29 ENCOUNTER — Encounter: Payer: Self-pay | Admitting: Internal Medicine

## 2011-11-05 ENCOUNTER — Encounter: Payer: Self-pay | Admitting: Internal Medicine

## 2011-11-05 ENCOUNTER — Ambulatory Visit (AMBULATORY_SURGERY_CENTER): Payer: Medicare Other | Admitting: *Deleted

## 2011-11-05 VITALS — Ht 69.5 in | Wt 202.8 lb

## 2011-11-05 DIAGNOSIS — Z1211 Encounter for screening for malignant neoplasm of colon: Secondary | ICD-10-CM

## 2011-11-05 MED ORDER — MOVIPREP 100 G PO SOLR: ORAL | Status: DC

## 2011-11-19 ENCOUNTER — Ambulatory Visit (AMBULATORY_SURGERY_CENTER): Payer: Medicare Other | Admitting: Internal Medicine

## 2011-11-19 ENCOUNTER — Encounter: Payer: Self-pay | Admitting: Internal Medicine

## 2011-11-19 VITALS — BP 138/86 | HR 95 | Temp 98.9°F | Resp 19 | Ht 69.5 in | Wt 202.0 lb

## 2011-11-19 DIAGNOSIS — D126 Benign neoplasm of colon, unspecified: Secondary | ICD-10-CM

## 2011-11-19 DIAGNOSIS — Z1211 Encounter for screening for malignant neoplasm of colon: Secondary | ICD-10-CM

## 2011-11-19 MED ORDER — SODIUM CHLORIDE 0.9 % IV SOLN: 500.0000 mL | INTRAVENOUS | Status: DC

## 2011-11-19 NOTE — Progress Notes (Signed)
Patient did not experience any of the following events: a burn prior to discharge; a fall within the facility; wrong site/side/patient/procedure/implant event; or a hospital transfer or hospital admission upon discharge from the facility. (G8907)Patient did not have preoperative order for IV antibiotic SSI prophylaxis. (G 

## 2011-11-19 NOTE — Op Note (Signed)
[facility] [address] [place], [address]   COLONOSCOPY PROCEDURE REPORT  PATIENT: Jeffrey Ewing, Jeffrey Ewing  MR#: [id number] BIRTHDATE: [birth year] , 71  yrs. old GENDER: Male ENDOSCOPIST: Hart Carwin, MD REFERRED BY:  Elias Else, M.D. PROCEDURE DATE:  11/19/2011 PROCEDURE:   Colonoscopy with snare polypectomy and Colonoscopy with cold biopsy polypectomy ASA CLASS:   Class II INDICATIONS:average risk patient for colon cancer and last colon 2003 was normal. MEDICATIONS: MAC sedation, administered by CRNA and Propofol (Diprivan) 300 mg IV  DESCRIPTION OF PROCEDURE:   After the risks and benefits and of the procedure were explained, informed consent was obtained.  A digital rectal exam revealed no abnormalities of the rectum.    The LB CF-[id number]  endoscope was introduced through the anus and advanced to the cecum, which was identified by both the appendix and ileocecal valve .  The quality of the prep was good, using MoviPrep .  The instrument was then slowly withdrawn as the colon was fully examined.     COLON FINDINGS: One sessile polyps measuring 15 mm in size were found at the cecum.  A polypectomy was performed using snare cautery.  polyp removed in 2 pieces  , second polyp 5 mm removed with biopsies, at 50 cm  . Moderately severe diverticulosis of the entire colon, predominantely in the left colon, First grade hemorrhoids present  Retroflexed views revealed no abnormalities. The scope was then withdrawn from the patient and the procedure completed.  COMPLICATIONS: There were no complications. ENDOSCOPIC IMPRESSION: Two sessile polyps measuring 15 mm in size were found at the cecum; polypectomy was performed using snare cautery Moderately severe diverticulosis of the left and the right colon First grade hemorrhoids  RECOMMENDATIONS: 1.  Await pathology results 2.  High fiber diet.   REPEAT EXAM: In 5 year(s)  for  Colonoscopy.  cc:  _______________________________ eSignedHart Carwin, MD 11/19/2011 9:16 AM     PATIENT NAME:  [name], [name] MR#: [id number]

## 2011-11-19 NOTE — Patient Instructions (Addendum)

## 2011-11-20 ENCOUNTER — Telehealth: Payer: Self-pay | Admitting: *Deleted

## 2011-11-20 NOTE — Telephone Encounter (Signed)
  Follow up Call-  Call back number 11/19/2011  Post procedure Call Back phone  # [phone] hm  Permission to leave phone message Yes     Patient questions:  Do you have a fever, pain , or abdominal swelling? no Pain Score  0 *  Have you tolerated food without any problems? yes  Have you been able to return to your normal activities? yes  Do you have any questions about your discharge instructions: Diet   no Medications  no Follow up visit  no  Do you have questions or concerns about your Care? no  Actions: * If pain score is 4 or above: No action needed, pain <4.

## 2011-12-01 ENCOUNTER — Encounter: Payer: Self-pay | Admitting: Internal Medicine

## 2011-12-30 HISTORY — PX: COLONOSCOPY W/ POLYPECTOMY: SHX1380

## 2012-12-07 ENCOUNTER — Other Ambulatory Visit: Payer: Self-pay | Admitting: Orthopedic Surgery

## 2012-12-07 NOTE — Progress Notes (Signed)
Preoperative surgical orders have been place into the Epic hospital system for Jeffrey Ewing on 12/07/2012, 5:06 PM  by Patrica Duel for surgery on 12/20/2012.  Preop Total Knee orders including Experal, PO Tylenol, and IV Decadron as long as there are no contraindications to the above medications. Avel Peace, PA-C

## 2012-12-10 ENCOUNTER — Encounter (HOSPITAL_COMMUNITY): Payer: Self-pay | Admitting: Pharmacy Technician

## 2012-12-14 ENCOUNTER — Encounter (HOSPITAL_COMMUNITY): Payer: Self-pay

## 2012-12-14 ENCOUNTER — Ambulatory Visit (HOSPITAL_COMMUNITY)
Admission: RE | Admit: 2012-12-14 | Discharge: 2012-12-14 | Disposition: A | Discharge status: Home / Self Care | Payer: Medicare Other | Source: Ambulatory Visit | Attending: Orthopedic Surgery | Admitting: Orthopedic Surgery

## 2012-12-14 ENCOUNTER — Encounter (HOSPITAL_COMMUNITY)
Admission: RE | Admit: 2012-12-14 | Discharge: 2012-12-14 | Disposition: A | Discharge status: Home / Self Care | Payer: Medicare Other | Source: Ambulatory Visit | Attending: Orthopedic Surgery | Admitting: Orthopedic Surgery

## 2012-12-14 DIAGNOSIS — M1710 Unilateral primary osteoarthritis, unspecified knee: Secondary | ICD-10-CM | POA: Insufficient documentation

## 2012-12-14 DIAGNOSIS — Z01810 Encounter for preprocedural cardiovascular examination: Secondary | ICD-10-CM | POA: Insufficient documentation

## 2012-12-14 DIAGNOSIS — Z01818 Encounter for other preprocedural examination: Secondary | ICD-10-CM | POA: Insufficient documentation

## 2012-12-14 DIAGNOSIS — M8448XA Pathological fracture, other site, initial encounter for fracture: Secondary | ICD-10-CM | POA: Insufficient documentation

## 2012-12-14 DIAGNOSIS — IMO0002 Reserved for concepts with insufficient information to code with codable children: Secondary | ICD-10-CM | POA: Insufficient documentation

## 2012-12-14 DIAGNOSIS — Z01812 Encounter for preprocedural laboratory examination: Secondary | ICD-10-CM | POA: Insufficient documentation

## 2012-12-14 HISTORY — DX: Cerebral infarction, unspecified: I63.9

## 2012-12-14 HISTORY — DX: Personal history of urinary calculi: Z87.442

## 2012-12-14 HISTORY — DX: Unspecified osteoarthritis, unspecified site: M19.90

## 2012-12-14 HISTORY — DX: Congenital malformation of heart, unspecified: Q24.9

## 2012-12-14 LAB — URINALYSIS, ROUTINE W REFLEX MICROSCOPIC
Nitrite: NEGATIVE
Protein, ur: NEGATIVE mg/dL
Specific Gravity, Urine: 1.019 (ref 1.005–1.030)
Urobilinogen, UA: 0.2 mg/dL (ref 0.0–1.0)

## 2012-12-14 LAB — COMPREHENSIVE METABOLIC PANEL
Albumin: 4.1 g/dL (ref 3.5–5.2)
BUN: 20 mg/dL (ref 6–23)
Calcium: 9.6 mg/dL (ref 8.4–10.5)
Creatinine, Ser: 0.97 mg/dL (ref 0.50–1.35)
GFR calc Af Amer: >90 mL/min (ref >90)
Glucose, Bld: 102 mg/dL — ABNORMAL HIGH (ref 70–99)
Total Protein: 6.8 g/dL (ref 6.0–8.3)

## 2012-12-14 LAB — SURGICAL PCR SCREEN: MRSA, PCR: NEGATIVE

## 2012-12-14 LAB — CBC
HCT: 42.6 % (ref 39.0–52.0)
Hemoglobin: 14.3 g/dL (ref 13.0–17.0)
MCH: 34.7 pg — ABNORMAL HIGH (ref 26.0–34.0)
MCHC: 33.6 g/dL (ref 30.0–36.0)
MCV: 103.4 fL — ABNORMAL HIGH (ref 78.0–100.0)
RDW: 14 % (ref 11.5–15.5)

## 2012-12-14 LAB — PROTIME-INR
INR: 0.99 (ref 0.00–1.49)
Prothrombin Time: 12.9 seconds (ref 11.6–15.2)

## 2012-12-14 LAB — APTT: aPTT: 30 seconds (ref 24–37)

## 2012-12-14 NOTE — Progress Notes (Signed)
Surgery clearance note Dr. Nicholos Johns 08/05/12 on chart

## 2012-12-14 NOTE — Patient Instructions (Addendum)
[address] [facility]  12/14/2012   Your procedure is scheduled on: 12/20/12  Report to [facility] at 9:40 AM.  Call this number if you have problems the morning of surgery [phone]: [phone]   Remember:   Do not eat food or drink liquids After Midnight.     Take these medicines the morning of surgery with A SIP OF WATER: crestor   Do not wear jewelry, make-up or nail polish.  Do not wear lotions, powders, or perfumes. You may wear deodorant.  Do not shave 48 hours prior to surgery. Men may shave face and neck.  Do not bring valuables to the hospital.  Contacts, dentures or bridgework may not be worn into surgery.  Leave suitcase in the car. After surgery it may be brought to your room.  For patients admitted to the hospital, checkout time is 11:00 AM the day of discharge.    Please read over the following fact sheets that you were given: MRSA Information, incentive spirometry fact sheet, blood fact sheet Birdie Sons, RN  pre op nurse call if needed [phone]    FAILURE TO FOLLOW THESE INSTRUCTIONS MAY RESULT IN CANCELLATION OF YOUR SURGERY   Patient Signature: ___________________________________________

## 2012-12-19 ENCOUNTER — Other Ambulatory Visit: Payer: Self-pay | Admitting: Orthopedic Surgery

## 2012-12-19 NOTE — H&P (Signed)
Jeffrey Ewing  DOB: 09-19-1940 Married / Language: English / Race: White Male  Date of Admission:  12/20/2012  Chief Complaint:  Right Knee Pain  History of Present Illness The patient is a 72 year old male who comes in for a preoperative History and Physical. The patient is scheduled for a right total knee arthroplasty to be performed by Dr. Gus Rankin. Aluisio, MD at [facility] on 12/20/2012. Jeffrey Ewing is a 72 year old male in for check of his right knee and discussion of knee replacement surgery. He is about two years out from his left total knee and doing great with that. He is not having any problems at all with the left knee. He was seen here in July for his right knee. He was having considerable pain and dysfunction and also had bone on bone change. Discussed treatment options and he wanted to proceed with total knee arthroplasty. He is scheduled in September and here today to discuss it. The knee is hurting all the time. It is limiting what he can and cannot do. He has a lot of problems with functional activities, even sometimes with activities of daily living now. He can do things, but it is getting harder to do it and more painful to do it. He is ready to get the knee fixed. They have been treated conservatively in the past for the above stated problem and despite conservative measures, they continue to have progressive pain and severe functional limitations and dysfunction. They have failed non-operative management including home exercise, medications. It is felt that they would benefit from undergoing total joint replacement. Risks and benefits of the procedure have been discussed with the patient and they elect to proceed with surgery. There are no active contraindications to surgery such as ongoing infection or rapidly progressive neurological disease.   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 01/28/2011   Family History Hypertension. father Heart Disease. father   Social History Marital status. married Living situation. live with spouse Pain Contract. no Number of flights of stairs before winded. 2-3 Drug/Alcohol Rehab (Previously). no Drug/Alcohol Rehab (Currently). no Illicit drug use. no Exercise. Exercises weekly; does running / walking and gym / weights Tobacco use. Never smoker. smoke(d) 1 pack(s) per day former smoker; smoke(d) 3/4 pack(s) per day Tobacco / smoke exposure. no Current work status. retired Copywriter, advertising. 3 Alcohol use. Occasional alcohol use. current drinker; drinks beer and wine; only occasionally per week   Medication History Aggrenox (25-200MG  Capsule ER 12HR, Oral) Active. Crestor (20MG  Tablet, Oral) Active. Ibuprofen (200MG  Capsule, 1 (one) Oral) Active.   Past Surgical History Vasectomy Total Knee Replacement. left Arthroscopy of Knee. left Colon Polyp Removal - Colonoscopy Prostatectomy; Abdominal. Date: 2008.   Medical History Cerebrovascular Accident. 2005 Hypercholesterolemia Kidney Stone Prostate Disease Prostate Cancer Skin Cancer Tear, medial meniscus, knee, current (836.0). 07/03/1998 Osteoarthrosis NOS, lower leg (715.96)    Review of Systems General:Not Present- Chills, Fever, Night Sweats, Fatigue, Weight Gain, Weight Loss and Memory Loss. Skin:Not Present- Hives, Itching, Rash, Eczema and Lesions. HEENT:Not Present- Tinnitus, Headache, Double Vision, Visual Loss, Hearing Loss and Dentures. Respiratory:Not Present- Shortness of breath with exertion, Shortness of breath at rest, Allergies, Coughing up blood and Chronic Cough. Cardiovascular:Not Present- Chest Pain, Racing/skipping heartbeats, Difficulty Breathing Lying Down, Murmur, Swelling and Palpitations. Gastrointestinal:Not Present- Bloody Stool, Heartburn, Abdominal Pain,  Vomiting, Nausea, Constipation, Diarrhea, Difficulty Swallowing, Jaundice and Loss  of appetitie. Musculoskeletal:Present- Muscle Weakness and Joint Pain. Not Present- Muscle Pain, Joint Swelling, Back Pain, Morning Stiffness and Spasms. Neurological:Not Present- Tremor, Dizziness, Blackout spells, Paralysis, Difficulty with balance and Weakness. Psychiatric:Not Present- Insomnia.   Vitals Weight: 187 lb Height: 70 in Weight was reported by patient. Height was reported by patient. Body Surface Area: 2.05 m Body Mass Index: 26.83 kg/m Pulse: 64 (Regular) Resp.: 14 (Unlabored) BP: 128/72 (Sitting, Right Arm, Standard)    Physical Exam The physical exam findings are as follows:   General Mental Status - Alert, cooperative and good historian. General Appearance- pleasant. Not in acute distress. Orientation- Oriented X3. Build & Nutrition- Well nourished and Well developed.   Head and Neck Head- normocephalic, atraumatic . Neck Global Assessment- supple. no bruit auscultated on the right and no bruit auscultated on the left.   Eye Pupil- Bilateral- Regular and Round. Motion- Bilateral- EOMI.   Chest and Lung Exam Auscultation: Breath sounds:- clear at anterior chest wall and - clear at posterior chest wall. Adventitious sounds:- No Adventitious sounds.   Cardiovascular Auscultation:Rhythm- Regular rate and rhythm. Heart Sounds- S1 WNL and S2 WNL. Murmurs & Other Heart Sounds:Auscultation of the heart reveals - No Murmurs.   Abdomen Palpation/Percussion:Tenderness- Abdomen is non-tender to palpation. Rigidity (guarding)- Abdomen is soft. Auscultation:Auscultation of the abdomen reveals - Bowel sounds normal.   Musculoskeletal On exam well developed male, alert, and oriented in no apparent distress. His left knee looks great. There is no swelling. Range about 5 to 125 with no instability. Right knee no effusion. Marked  crepitus on range of motion. Range about 5 to 125. Tender medial greater than lateral with no instability.  RADIOGRAPHS: The left knee replacement is in excellent position with no abnormalities. The right knee is bone on bone medial and patellofemoral.  Assessment & Plan Primary osteoarthritis of one knee (715.16) Impression: Right Knee  Note: Plan is for a Right Total Knee Replacement by Dr. Lequita Halt.  Plan is to go home.  PCP - Dr. Nicholos Johns - Patient has been seen preoperatively and felt to be stable for surgery.  The patient will not receive TXA (tranexamic acid) due to: Stroke  Signed electronically by Lauraine Rinne, III PA-C

## 2012-12-20 ENCOUNTER — Inpatient Hospital Stay (HOSPITAL_COMMUNITY)
Admission: RE | Admit: 2012-12-20 | Discharge: 2012-12-22 | DRG: 470 | Disposition: A | Discharge status: Home Health | Payer: Medicare Other | Source: Ambulatory Visit | Attending: Orthopedic Surgery | Admitting: Orthopedic Surgery

## 2012-12-20 ENCOUNTER — Encounter (HOSPITAL_COMMUNITY)
Admission: RE | Disposition: A | Discharge status: Home Health | Payer: Self-pay | Source: Ambulatory Visit | Attending: Orthopedic Surgery

## 2012-12-20 ENCOUNTER — Inpatient Hospital Stay (HOSPITAL_COMMUNITY): Payer: Medicare Other | Admitting: Anesthesiology

## 2012-12-20 ENCOUNTER — Encounter (HOSPITAL_COMMUNITY): Payer: Self-pay | Admitting: Anesthesiology

## 2012-12-20 ENCOUNTER — Encounter (HOSPITAL_COMMUNITY): Payer: Self-pay | Admitting: *Deleted

## 2012-12-20 DIAGNOSIS — Z87442 Personal history of urinary calculi: Secondary | ICD-10-CM

## 2012-12-20 DIAGNOSIS — Z8673 Personal history of transient ischemic attack (TIA), and cerebral infarction without residual deficits: Secondary | ICD-10-CM

## 2012-12-20 DIAGNOSIS — Z8546 Personal history of malignant neoplasm of prostate: Secondary | ICD-10-CM

## 2012-12-20 DIAGNOSIS — Z79899 Other long term (current) drug therapy: Secondary | ICD-10-CM

## 2012-12-20 DIAGNOSIS — E785 Hyperlipidemia, unspecified: Secondary | ICD-10-CM | POA: Diagnosis present

## 2012-12-20 DIAGNOSIS — D62 Acute posthemorrhagic anemia: Secondary | ICD-10-CM | POA: Diagnosis not present

## 2012-12-20 DIAGNOSIS — Z791 Long term (current) use of non-steroidal anti-inflammatories (NSAID): Secondary | ICD-10-CM

## 2012-12-20 DIAGNOSIS — Z96651 Presence of right artificial knee joint: Secondary | ICD-10-CM

## 2012-12-20 DIAGNOSIS — M179 Osteoarthritis of knee, unspecified: Secondary | ICD-10-CM | POA: Diagnosis present

## 2012-12-20 DIAGNOSIS — M1710 Unilateral primary osteoarthritis, unspecified knee: Principal | ICD-10-CM | POA: Diagnosis present

## 2012-12-20 DIAGNOSIS — E7800 Pure hypercholesterolemia, unspecified: Secondary | ICD-10-CM | POA: Diagnosis present

## 2012-12-20 DIAGNOSIS — Z85820 Personal history of malignant melanoma of skin: Secondary | ICD-10-CM

## 2012-12-20 HISTORY — PX: TOTAL KNEE ARTHROPLASTY: SHX125

## 2012-12-20 LAB — TYPE AND SCREEN
ABO/RH(D): O POS
Antibody Screen: NEGATIVE

## 2012-12-20 SURGERY — ARTHROPLASTY, KNEE, TOTAL
Anesthesia: Spinal | Site: Knee | Laterality: Right | Wound class: Clean

## 2012-12-20 MED ORDER — LACTATED RINGERS IV SOLN
INTRAVENOUS | Status: DC
  Administered 2012-12-20: 1000 mL via INTRAVENOUS

## 2012-12-20 MED ORDER — CEFAZOLIN SODIUM-DEXTROSE 2-3 GM-% IV SOLR
2.0000 g | INTRAVENOUS | Status: AC
  Administered 2012-12-20: 2 g via INTRAVENOUS

## 2012-12-20 MED ORDER — BISACODYL 10 MG RE SUPP: 10.0000 mg | Freq: Every day | RECTAL | Status: DC | PRN

## 2012-12-20 MED ORDER — ACETAMINOPHEN 500 MG PO TABS
1000.0000 mg | ORAL_TABLET | Freq: Four times a day (QID) | ORAL | Status: AC
  Administered 2012-12-20 – 2012-12-21 (×3): 1000 mg via ORAL
  Filled 2012-12-20 (×4): qty 2

## 2012-12-20 MED ORDER — OXYCODONE HCL 5 MG PO TABS
5.0000 mg | ORAL_TABLET | ORAL | Status: DC | PRN
  Administered 2012-12-20 – 2012-12-22 (×11): 10 mg via ORAL
  Administered 2012-12-22: 5 mg via ORAL
  Administered 2012-12-22: 10 mg via ORAL
  Filled 2012-12-20 (×13): qty 2

## 2012-12-20 MED ORDER — BUPIVACAINE LIPOSOME 1.3 % IJ SUSP
20.0000 mL | Freq: Once | INTRAMUSCULAR | Status: DC
  Filled 2012-12-20: qty 20

## 2012-12-20 MED ORDER — METHOCARBAMOL 500 MG PO TABS
500.0000 mg | ORAL_TABLET | Freq: Four times a day (QID) | ORAL | Status: DC | PRN
  Administered 2012-12-20 – 2012-12-22 (×5): 500 mg via ORAL
  Filled 2012-12-20 (×5): qty 1

## 2012-12-20 MED ORDER — ASPIRIN-DIPYRIDAMOLE ER 25-200 MG PO CP12
1.0000 | ORAL_CAPSULE | Freq: Two times a day (BID) | ORAL | Status: DC
  Administered 2012-12-21 – 2012-12-22 (×3): 1 via ORAL
  Filled 2012-12-20 (×4): qty 1

## 2012-12-20 MED ORDER — DEXTROSE-NACL 5-0.9 % IV SOLN
INTRAVENOUS | Status: DC
  Administered 2012-12-20: 16:00:00 via INTRAVENOUS
  Administered 2012-12-21: 20 mL/h via INTRAVENOUS

## 2012-12-20 MED ORDER — POLYETHYLENE GLYCOL 3350 17 G PO PACK: 17.0000 g | PACK | Freq: Every day | ORAL | Status: DC | PRN

## 2012-12-20 MED ORDER — LIDOCAINE HCL (CARDIAC) 20 MG/ML IV SOLN
INTRAVENOUS | Status: DC | PRN
  Administered 2012-12-20: 100 mg via INTRAVENOUS

## 2012-12-20 MED ORDER — ONDANSETRON HCL 4 MG/2ML IJ SOLN
INTRAMUSCULAR | Status: DC | PRN
  Administered 2012-12-20: 4 mg via INTRAVENOUS

## 2012-12-20 MED ORDER — SODIUM CHLORIDE 0.9 % IV SOLN: INTRAVENOUS | Status: DC

## 2012-12-20 MED ORDER — RIVAROXABAN 10 MG PO TABS
10.0000 mg | ORAL_TABLET | Freq: Every day | ORAL | Status: DC
  Administered 2012-12-21 – 2012-12-22 (×2): 10 mg via ORAL
  Filled 2012-12-20 (×3): qty 1

## 2012-12-20 MED ORDER — PHENOL 1.4 % MT LIQD
1.0000 | OROMUCOSAL | Status: DC | PRN
  Filled 2012-12-20: qty 177

## 2012-12-20 MED ORDER — BUPIVACAINE IN DEXTROSE 0.75-8.25 % IT SOLN
INTRATHECAL | Status: DC | PRN
  Administered 2012-12-20: 1.8 mL via INTRATHECAL

## 2012-12-20 MED ORDER — SODIUM CHLORIDE 0.9 % IR SOLN
Status: DC | PRN
  Administered 2012-12-20: 1000 mL

## 2012-12-20 MED ORDER — FENTANYL CITRATE 0.05 MG/ML IJ SOLN: 25.0000 ug | INTRAMUSCULAR | Status: DC | PRN

## 2012-12-20 MED ORDER — CEFAZOLIN SODIUM-DEXTROSE 2-3 GM-% IV SOLR
INTRAVENOUS | Status: AC
  Filled 2012-12-20: qty 50

## 2012-12-20 MED ORDER — DIPHENHYDRAMINE HCL 12.5 MG/5ML PO ELIX: 12.5000 mg | ORAL_SOLUTION | ORAL | Status: DC | PRN

## 2012-12-20 MED ORDER — SODIUM CHLORIDE 0.9 % IJ SOLN
INTRAMUSCULAR | Status: AC
  Filled 2012-12-20: qty 50

## 2012-12-20 MED ORDER — DEXAMETHASONE SODIUM PHOSPHATE 10 MG/ML IJ SOLN
10.0000 mg | Freq: Every day | INTRAMUSCULAR | Status: AC
  Filled 2012-12-20: qty 1

## 2012-12-20 MED ORDER — PHENYLEPHRINE HCL 10 MG/ML IJ SOLN
INTRAMUSCULAR | Status: DC | PRN
  Administered 2012-12-20: 80 ug via INTRAVENOUS

## 2012-12-20 MED ORDER — ONDANSETRON HCL 4 MG/2ML IJ SOLN: 4.0000 mg | Freq: Four times a day (QID) | INTRAMUSCULAR | Status: DC | PRN

## 2012-12-20 MED ORDER — FENTANYL CITRATE 0.05 MG/ML IJ SOLN
INTRAMUSCULAR | Status: DC | PRN
  Administered 2012-12-20: 100 ug via INTRAVENOUS

## 2012-12-20 MED ORDER — STERILE WATER FOR IRRIGATION IR SOLN
Status: DC | PRN
  Administered 2012-12-20: 1500 mL

## 2012-12-20 MED ORDER — PROPOFOL INFUSION 10 MG/ML OPTIME
INTRAVENOUS | Status: DC | PRN
  Administered 2012-12-20: 160 ug/kg/min via INTRAVENOUS

## 2012-12-20 MED ORDER — ONDANSETRON HCL 4 MG PO TABS
4.0000 mg | ORAL_TABLET | Freq: Four times a day (QID) | ORAL | Status: DC | PRN
  Administered 2012-12-21: 14:00:00 4 mg via ORAL
  Filled 2012-12-20: qty 1

## 2012-12-20 MED ORDER — DEXAMETHASONE 6 MG PO TABS
10.0000 mg | ORAL_TABLET | Freq: Every day | ORAL | Status: AC
  Administered 2012-12-21: 10 mg via ORAL
  Filled 2012-12-20: qty 1

## 2012-12-20 MED ORDER — SODIUM CHLORIDE 0.9 % IJ SOLN
INTRAMUSCULAR | Status: DC | PRN
  Administered 2012-12-20: 30 mL

## 2012-12-20 MED ORDER — DEXTROSE 5 % IV SOLN
500.0000 mg | Freq: Four times a day (QID) | INTRAVENOUS | Status: DC | PRN
  Administered 2012-12-20: 500 mg via INTRAVENOUS
  Filled 2012-12-20 (×2): qty 5

## 2012-12-20 MED ORDER — MENTHOL 3 MG MT LOZG
1.0000 | LOZENGE | OROMUCOSAL | Status: DC | PRN
  Filled 2012-12-20: qty 9

## 2012-12-20 MED ORDER — CEFAZOLIN SODIUM 1-5 GM-% IV SOLN
1.0000 g | Freq: Four times a day (QID) | INTRAVENOUS | Status: AC
  Administered 2012-12-20 – 2012-12-21 (×2): 1 g via INTRAVENOUS
  Filled 2012-12-20 (×2): qty 50

## 2012-12-20 MED ORDER — BUPIVACAINE LIPOSOME 1.3 % IJ SUSP
INTRAMUSCULAR | Status: DC | PRN
  Administered 2012-12-20 (×2): 20 mL

## 2012-12-20 MED ORDER — DEXAMETHASONE SODIUM PHOSPHATE 10 MG/ML IJ SOLN
10.0000 mg | Freq: Once | INTRAMUSCULAR | Status: AC
  Administered 2012-12-20: 10 mg via INTRAVENOUS

## 2012-12-20 MED ORDER — METOCLOPRAMIDE HCL 5 MG PO TABS
5.0000 mg | ORAL_TABLET | Freq: Three times a day (TID) | ORAL | Status: DC | PRN
  Filled 2012-12-20: qty 2

## 2012-12-20 MED ORDER — MIDAZOLAM HCL 5 MG/5ML IJ SOLN
INTRAMUSCULAR | Status: DC | PRN
  Administered 2012-12-20: 2 mg via INTRAVENOUS

## 2012-12-20 MED ORDER — TRAMADOL HCL 50 MG PO TABS
50.0000 mg | ORAL_TABLET | Freq: Four times a day (QID) | ORAL | Status: DC | PRN
  Administered 2012-12-21: 100 mg via ORAL
  Filled 2012-12-20: qty 2

## 2012-12-20 MED ORDER — PROMETHAZINE HCL 25 MG/ML IJ SOLN: 6.2500 mg | INTRAMUSCULAR | Status: DC | PRN

## 2012-12-20 MED ORDER — METOCLOPRAMIDE HCL 5 MG/ML IJ SOLN: 5.0000 mg | Freq: Three times a day (TID) | INTRAMUSCULAR | Status: DC | PRN

## 2012-12-20 MED ORDER — EPHEDRINE SULFATE 50 MG/ML IJ SOLN
INTRAMUSCULAR | Status: DC | PRN
  Administered 2012-12-20: 5 mg via INTRAVENOUS

## 2012-12-20 MED ORDER — BUPIVACAINE HCL 0.25 % IJ SOLN
INTRAMUSCULAR | Status: DC | PRN
  Administered 2012-12-20: 20 mL

## 2012-12-20 MED ORDER — ACETAMINOPHEN 500 MG PO TABS
1000.0000 mg | ORAL_TABLET | Freq: Once | ORAL | Status: AC
  Administered 2012-12-20: 1000 mg via ORAL
  Filled 2012-12-20: qty 2

## 2012-12-20 MED ORDER — FLEET ENEMA 7-19 GM/118ML RE ENEM: 1.0000 | ENEMA | Freq: Once | RECTAL | Status: AC | PRN

## 2012-12-20 MED ORDER — MORPHINE SULFATE 10 MG/ML IJ SOLN: 1.0000 mg | INTRAMUSCULAR | Status: DC | PRN

## 2012-12-20 MED ORDER — 0.9 % SODIUM CHLORIDE (POUR BTL) OPTIME
TOPICAL | Status: DC | PRN
  Administered 2012-12-20: 1000 mL

## 2012-12-20 MED ORDER — BUPIVACAINE HCL (PF) 0.25 % IJ SOLN
INTRAMUSCULAR | Status: AC
  Filled 2012-12-20: qty 30

## 2012-12-20 MED ORDER — DOCUSATE SODIUM 100 MG PO CAPS
100.0000 mg | ORAL_CAPSULE | Freq: Two times a day (BID) | ORAL | Status: DC
  Administered 2012-12-20 – 2012-12-22 (×5): 100 mg via ORAL

## 2012-12-20 SURGICAL SUPPLY — 57 items
BAG SPEC THK2 15X12 ZIP CLS (MISCELLANEOUS) ×1
BAG ZIPLOCK 12X15 (MISCELLANEOUS) ×2 IMPLANT
BANDAGE ELASTIC 6 VELCRO ST LF (GAUZE/BANDAGES/DRESSINGS) ×2 IMPLANT
BANDAGE ESMARK 6X9 LF (GAUZE/BANDAGES/DRESSINGS) ×1 IMPLANT
BLADE SAG 18X100X1.27 (BLADE) ×2 IMPLANT
BLADE SAW SGTL 11.0X1.19X90.0M (BLADE) ×2 IMPLANT
BNDG CMPR 9X6 STRL LF SNTH (GAUZE/BANDAGES/DRESSINGS) ×1
BNDG ESMARK 6X9 LF (GAUZE/BANDAGES/DRESSINGS) ×2
BOWL SMART MIX CTS (DISPOSABLE) ×2 IMPLANT
CAPT RP KNEE ×1 IMPLANT
CEMENT HV SMART SET (Cement) ×4 IMPLANT
CLOTH BEACON ORANGE TIMEOUT ST (SAFETY) ×2 IMPLANT
CUFF TOURN SGL QUICK 34 (TOURNIQUET CUFF) ×2
CUFF TRNQT CYL 34X4X40X1 (TOURNIQUET CUFF) ×1 IMPLANT
DECANTER SPIKE VIAL GLASS SM (MISCELLANEOUS) ×2 IMPLANT
DRAPE EXTREMITY T 121X128X90 (DRAPE) ×2 IMPLANT
DRAPE POUCH INSTRU U-SHP 10X18 (DRAPES) ×2 IMPLANT
DRAPE U-SHAPE 47X51 STRL (DRAPES) ×2 IMPLANT
DRSG ADAPTIC 3X8 NADH LF (GAUZE/BANDAGES/DRESSINGS) ×2 IMPLANT
DRSG PAD ABDOMINAL 8X10 ST (GAUZE/BANDAGES/DRESSINGS) ×2 IMPLANT
DURAPREP 26ML APPLICATOR (WOUND CARE) ×2 IMPLANT
ELECT REM PT RETURN 9FT ADLT (ELECTROSURGICAL) ×2
ELECTRODE REM PT RTRN 9FT ADLT (ELECTROSURGICAL) ×1 IMPLANT
EVACUATOR 1/8 PVC DRAIN (DRAIN) ×2 IMPLANT
FACESHIELD LNG OPTICON STERILE (SAFETY) ×10 IMPLANT
GLOVE BIO SURGEON STRL SZ7.5 (GLOVE) IMPLANT
GLOVE BIO SURGEON STRL SZ8 (GLOVE) ×2 IMPLANT
GLOVE BIOGEL PI IND STRL 8 (GLOVE) ×2 IMPLANT
GLOVE BIOGEL PI INDICATOR 8 (GLOVE) ×2
GLOVE SURG SS PI 6.5 STRL IVOR (GLOVE) IMPLANT
GOWN PREVENTION PLUS LG XLONG (DISPOSABLE) ×2 IMPLANT
GOWN STRL REIN XL XLG (GOWN DISPOSABLE) IMPLANT
HANDPIECE INTERPULSE COAX TIP (DISPOSABLE) ×2
IMMOBILIZER KNEE 20 (SOFTGOODS) ×2
IMMOBILIZER KNEE 20 THIGH 36 (SOFTGOODS) ×1 IMPLANT
KIT BASIN OR (CUSTOM PROCEDURE TRAY) ×2 IMPLANT
MANIFOLD NEPTUNE II (INSTRUMENTS) ×2 IMPLANT
NDL SAFETY ECLIPSE 18X1.5 (NEEDLE) ×2 IMPLANT
NEEDLE HYPO 18GX1.5 SHARP (NEEDLE) ×4
NS IRRIG 1000ML POUR BTL (IV SOLUTION) ×2 IMPLANT
PACK TOTAL JOINT (CUSTOM PROCEDURE TRAY) ×2 IMPLANT
PADDING CAST COTTON 6X4 STRL (CAST SUPPLIES) ×5 IMPLANT
POSITIONER SURGICAL ARM (MISCELLANEOUS) ×2 IMPLANT
SET HNDPC FAN SPRY TIP SCT (DISPOSABLE) ×1 IMPLANT
SPONGE GAUZE 4X4 12PLY (GAUZE/BANDAGES/DRESSINGS) ×2 IMPLANT
STRIP CLOSURE SKIN 1/2X4 (GAUZE/BANDAGES/DRESSINGS) ×4 IMPLANT
SUCTION FRAZIER 12FR DISP (SUCTIONS) ×2 IMPLANT
SUT MNCRL AB 4-0 PS2 18 (SUTURE) ×2 IMPLANT
SUT VIC AB 2-0 CT1 27 (SUTURE) ×6
SUT VIC AB 2-0 CT1 TAPERPNT 27 (SUTURE) ×3 IMPLANT
SUT VLOC 180 0 24IN GS25 (SUTURE) ×2 IMPLANT
SYR 20CC LL (SYRINGE) ×2 IMPLANT
SYR 50ML LL SCALE MARK (SYRINGE) ×2 IMPLANT
TOWEL OR 17X26 10 PK STRL BLUE (TOWEL DISPOSABLE) ×4 IMPLANT
TRAY FOLEY CATH 14FRSI W/METER (CATHETERS) ×2 IMPLANT
WATER STERILE IRR 1500ML POUR (IV SOLUTION) ×2 IMPLANT
WRAP KNEE MAXI GEL POST OP (GAUZE/BANDAGES/DRESSINGS) ×2 IMPLANT

## 2012-12-20 NOTE — Anesthesia Preprocedure Evaluation (Addendum)
Anesthesia Evaluation  Patient identified by MRN, date of birth, ID band Patient awake    Reviewed: Allergy & Precautions, H&P , NPO status , Patient's Chart, lab work & pertinent test results  Airway Mallampati: II TM Distance: >3 FB Neck ROM: Full    Dental no notable dental hx.    Pulmonary neg pulmonary ROS,  breath sounds clear to auscultation  Pulmonary exam normal       Cardiovascular negative cardio ROS  Rhythm:Regular Rate:Normal     Neuro/Psych TIAnegative psych ROS   GI/Hepatic negative GI ROS, Neg liver ROS,   Endo/Other  negative endocrine ROS  Renal/GU negative Renal ROS  negative genitourinary   Musculoskeletal negative musculoskeletal ROS (+)   Abdominal   Peds negative pediatric ROS (+)  Hematology negative hematology ROS (+)   Anesthesia Other Findings   Reproductive/Obstetrics negative OB ROS                           Anesthesia Physical Anesthesia Plan  ASA: II  Anesthesia Plan: Spinal   Post-op Pain Management:    Induction: Intravenous  Airway Management Planned: Simple Face Mask  Additional Equipment:   Intra-op Plan:   Post-operative Plan:   Informed Consent: I have reviewed the patients History and Physical, chart, labs and discussed the procedure including the risks, benefits and alternatives for the proposed anesthesia with the patient or authorized representative who has indicated his/her understanding and acceptance.     Plan Discussed with: CRNA and Surgeon  Anesthesia Plan Comments:         Anesthesia Quick Evaluation

## 2012-12-20 NOTE — H&P (View-Only) (Signed)
Jeffrey Ewing  DOB: 07/27/1940 Married / Language: English / Race: White Male  Date of Admission:  12/20/2012  Chief Complaint:  Right Knee Pain  History of Present Illness The patient is a 72 year old male who comes in for a preoperative History and Physical. The patient is scheduled for a right total knee arthroplasty to be performed by Dr. Frank V. Aluisio, MD at [facility] on 12/20/2012. Jeffrey Ewing is a 72 year old male in for check of his right knee and discussion of knee replacement surgery. He is about two years out from his left total knee and doing great with that. He is not having any problems at all with the left knee. He was seen here in July for his right knee. He was having considerable pain and dysfunction and also had bone on bone change. Discussed treatment options and he wanted to proceed with total knee arthroplasty. He is scheduled in September and here today to discuss it. The knee is hurting all the time. It is limiting what he can and cannot do. He has a lot of problems with functional activities, even sometimes with activities of daily living now. He can do things, but it is getting harder to do it and more painful to do it. He is ready to get the knee fixed. They have been treated conservatively in the past for the above stated problem and despite conservative measures, they continue to have progressive pain and severe functional limitations and dysfunction. They have failed non-operative management including home exercise, medications. It is felt that they would benefit from undergoing total joint replacement. Risks and benefits of the procedure have been discussed with the patient and they elect to proceed with surgery. There are no active contraindications to surgery such as ongoing infection or rapidly progressive neurological disease.   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 01/28/2011   Family History Hypertension. father Heart Disease. father   Social History Marital status. married Living situation. live with spouse Pain Contract. no Number of flights of stairs before winded. 2-3 Drug/Alcohol Rehab (Previously). no Drug/Alcohol Rehab (Currently). no Illicit drug use. no Exercise. Exercises weekly; does running / walking and gym / weights Tobacco use. Never smoker. smoke(d) 1 pack(s) per day former smoker; smoke(d) 3/4 pack(s) per day Tobacco / smoke exposure. no Current work status. retired Children. 3 Alcohol use. Occasional alcohol use. current drinker; drinks beer and wine; only occasionally per week   Medication History Aggrenox (25-200MG Capsule ER 12HR, Oral) Active. Crestor (20MG Tablet, Oral) Active. Ibuprofen (200MG Capsule, 1 (one) Oral) Active.   Past Surgical History Vasectomy Total Knee Replacement. left Arthroscopy of Knee. left Colon Polyp Removal - Colonoscopy Prostatectomy; Abdominal. Date: 2008.   Medical History Cerebrovascular Accident. 2005 Hypercholesterolemia Kidney Stone Prostate Disease Prostate Cancer Skin Cancer Tear, medial meniscus, knee, current (836.0). 07/03/1998 Osteoarthrosis NOS, lower leg (715.96)    Review of Systems General:Not Present- Chills, Fever, Night Sweats, Fatigue, Weight Gain, Weight Loss and Memory Loss. Skin:Not Present- Hives, Itching, Rash, Eczema and Lesions. HEENT:Not Present- Tinnitus, Headache, Double Vision, Visual Loss, Hearing Loss and Dentures. Respiratory:Not Present- Shortness of breath with exertion, Shortness of breath at rest, Allergies, Coughing up blood and Chronic Cough. Cardiovascular:Not Present- Chest Pain, Racing/skipping heartbeats, Difficulty Breathing Lying Down, Murmur, Swelling and Palpitations. Gastrointestinal:Not Present- Bloody Stool, Heartburn, Abdominal Pain,  Vomiting, Nausea, Constipation, Diarrhea, Difficulty Swallowing, Jaundice and Loss   of appetitie. Musculoskeletal:Present- Muscle Weakness and Joint Pain. Not Present- Muscle Pain, Joint Swelling, Back Pain, Morning Stiffness and Spasms. Neurological:Not Present- Tremor, Dizziness, Blackout spells, Paralysis, Difficulty with balance and Weakness. Psychiatric:Not Present- Insomnia.   Vitals Weight: 187 lb Height: 70 in Weight was reported by patient. Height was reported by patient. Body Surface Area: 2.05 m Body Mass Index: 26.83 kg/m Pulse: 64 (Regular) Resp.: 14 (Unlabored) BP: 128/72 (Sitting, Right Arm, Standard)    Physical Exam The physical exam findings are as follows:   General Mental Status - Alert, cooperative and good historian. General Appearance- pleasant. Not in acute distress. Orientation- Oriented X3. Build & Nutrition- Well nourished and Well developed.   Head and Neck Head- normocephalic, atraumatic . Neck Global Assessment- supple. no bruit auscultated on the right and no bruit auscultated on the left.   Eye Pupil- Bilateral- Regular and Round. Motion- Bilateral- EOMI.   Chest and Lung Exam Auscultation: Breath sounds:- clear at anterior chest wall and - clear at posterior chest wall. Adventitious sounds:- No Adventitious sounds.   Cardiovascular Auscultation:Rhythm- Regular rate and rhythm. Heart Sounds- S1 WNL and S2 WNL. Murmurs & Other Heart Sounds:Auscultation of the heart reveals - No Murmurs.   Abdomen Palpation/Percussion:Tenderness- Abdomen is non-tender to palpation. Rigidity (guarding)- Abdomen is soft. Auscultation:Auscultation of the abdomen reveals - Bowel sounds normal.   Musculoskeletal On exam well developed male, alert, and oriented in no apparent distress. His left knee looks great. There is no swelling. Range about 5 to 125 with no instability. Right knee no effusion. Marked  crepitus on range of motion. Range about 5 to 125. Tender medial greater than lateral with no instability.  RADIOGRAPHS: The left knee replacement is in excellent position with no abnormalities. The right knee is bone on bone medial and patellofemoral.  Assessment & Plan Primary osteoarthritis of one knee (715.16) Impression: Right Knee  Note: Plan is for a Right Total Knee Replacement by Dr. Aluisio.  Plan is to go home.  PCP - Dr. Reade - Patient has been seen preoperatively and felt to be stable for surgery.  The patient will not receive TXA (tranexamic acid) due to: Stroke  Signed electronically by Alexzandrew L Perkins, III PA-C 

## 2012-12-20 NOTE — Transfer of Care (Signed)
Immediate Anesthesia Transfer of Care Note  Patient: Jeffrey Ewing  Procedure(s) Performed: Procedure(s) (LRB): RIGHT TOTAL KNEE ARTHROPLASTY (Right)  Patient Location: PACU  Anesthesia Type: Spinal  Level of Consciousness: sedated, patient cooperative and responds to stimulation  Airway & Oxygen Therapy: Patient Spontanous Breathing and Patient connected to face mask oxgen  Post-op Assessment: Report given to PACU RN and Post -op Vital signs reviewed and stable  Post vital signs: Reviewed and stable  Complications: No apparent anesthesia complications

## 2012-12-20 NOTE — Progress Notes (Signed)
UR COMPLETED  

## 2012-12-20 NOTE — Interval H&P Note (Signed)
History and Physical Interval Note:  12/20/2012 11:51 AM  Jeffrey Ewing  has presented today for surgery, with the diagnosis of oa right knee   The various methods of treatment have been discussed with the patient and family. After consideration of risks, benefits and other options for treatment, the patient has consented to  Procedure(s): RIGHT TOTAL KNEE ARTHROPLASTY (Right) as a surgical intervention .  The patient's history has been reviewed, patient examined, no change in status, stable for surgery.  I have reviewed the patient's chart and labs.  Questions were answered to the patient's satisfaction.     Loanne Drilling

## 2012-12-20 NOTE — Op Note (Signed)
Pre-operative diagnosis- Osteoarthritis  Right knee(s)  Post-operative diagnosis- Osteoarthritis Right knee(s)  Procedure-  Right  Total Knee Arthroplasty  Surgeon- Jeffrey Rankin. Orabelle Rylee, MD  Assistant- Avel Peace, PA-C   Anesthesia-  Spinal EBL-* No blood loss amount entered *  Drains Hemovac  Tourniquet time-  Total Tourniquet Time Documented: Thigh (Right) - 39 minutes Total: Thigh (Right) - 39 minutes    Complications- None  Condition-PACU - hemodynamically stable.   Brief Clinical Note  Jeffrey Ewing is a 72 y.o. year old male with end stage OA of his right knee with progressively worsening pain and dysfunction. He has constant pain, with activity and at rest and significant functional deficits with difficulties even with ADLs. He has had extensive non-op management including analgesics, injections of cortisone, and home exercise program, but remains in significant pain with significant dysfunction. Radiographs show medial and patellofemoral bone on bone with varus deformity.He presents now for right Total Knee Arthroplasty.    Procedure in detail---   The patient is brought into the operating room and positioned supine on the operating table. After successful administration of  Spinal,   a tourniquet is placed high on the  Right thigh(s) and the lower extremity is prepped and draped in the usual sterile fashion. Time out is performed by the operating team and then the  Right lower extremity is wrapped in Esmarch, knee flexed and the tourniquet inflated to 300 mmHg.       A midline incision is made with a ten blade through the subcutaneous tissue to the level of the extensor mechanism. A fresh blade is used to make a medial parapatellar arthrotomy. Soft tissue over the proximal medial tibia is subperiosteally elevated to the joint line with a knife and into the semimembranosus bursa with a Cobb elevator. Soft tissue over the proximal lateral tibia is elevated with attention being paid  to avoiding the patellar tendon on the tibial tubercle. The patella is everted, knee flexed 90 degrees and the ACL and PCL are removed. Findings are bone on bone medial and patellofemoral with large medial osteophytes.        The drill is used to create a starting hole in the distal femur and the canal is thoroughly irrigated with sterile saline to remove the fatty contents. The 5 degree Right  valgus alignment guide is placed into the femoral canal and the distal femoral cutting block is pinned to remove 10 mm off the distal femur. Resection is made with an oscillating saw.      The tibia is subluxed forward and the menisci are removed. The extramedullary alignment guide is placed referencing proximally at the medial aspect of the tibial tubercle and distally along the second metatarsal axis and tibial crest. The block is pinned to remove 2mm off the more deficient medial  side. Resection is made with an oscillating saw. Size 3is the most appropriate size for the tibia and the proximal tibia is prepared with the modular drill and keel punch for that size.      The femoral sizing guide is placed and size 4 is most appropriate. Rotation is marked off the epicondylar axis and confirmed by creating a rectangular flexion gap at 90 degrees. The size 4 cutting block is pinned in this rotation and the anterior, posterior and chamfer cuts are made with the oscillating saw. The intercondylar block is then placed and that cut is made.      Trial size 3 tibial component, trial size 4 posterior  stabilized femur and a 12.5  mm posterior stabilized rotating platform insert trial is placed. Full extension is achieved with excellent varus/valgus and anterior/posterior balance throughout full range of motion. The patella is everted and thickness measured to be 24  mm. Free hand resection is taken to 14 mm, a 38 template is placed, lug holes are drilled, trial patella is placed, and it tracks normally. Osteophytes are removed off  the posterior femur with the trial in place. All trials are removed and the cut bone surfaces prepared with pulsatile lavage. Cement is mixed and once ready for implantation, the size 3 tibial implant, size  4 posterior stabilized femoral component, and the size 38 patella are cemented in place and the patella is held with the clamp. The trial insert is placed and the knee held in full extension. The Exparel (20 ml mixed with 30 ml saline) and .25% Bupivicaine, are injected into the extensor mechanism, posterior capsule, medial and lateral gutters and subcutaneous tissues.  All extruded cement is removed and once the cement is hard the permanent 12.5 mm posterior stabilized rotating platform insert is placed into the tibial tray.      The wound is copiously irrigated with saline solution and the extensor mechanism closed over a hemovac drain with #1 PDS suture. The tourniquet is released for a total tourniquet time of 39  minutes. Flexion against gravity is 140 degrees and the patella tracks normally. Subcutaneous tissue is closed with 2.0 vicryl and subcuticular with running 4.0 Monocryl. The incision is cleaned and dried and steri-strips and a bulky sterile dressing are applied. The limb is placed into a knee immobilizer and the patient is awakened and transported to recovery in stable condition.      Please note that a surgical assistant was a medical necessity for this procedure in order to perform it in a safe and expeditious manner. Surgical assistant was necessary to retract the ligaments and vital neurovascular structures to prevent injury to them and also necessary for proper positioning of the limb to allow for anatomic placement of the prosthesis.   Jeffrey Rankin Perris Conwell, MD    12/20/2012, 1:49 PM

## 2012-12-20 NOTE — Anesthesia Procedure Notes (Signed)

## 2012-12-20 NOTE — Progress Notes (Signed)
12/20/12 1600  PT Visit Information  Last PT Received On 12/20/12  Reason Eval/Treat Not Completed Medical issues which prohibited therapy (per RN, wait until 9/23 )

## 2012-12-20 NOTE — Preoperative (Signed)
Beta Blockers   Reason not to administer Beta Blockers:Not Applicable 

## 2012-12-21 ENCOUNTER — Encounter (HOSPITAL_COMMUNITY): Payer: Self-pay | Admitting: Orthopedic Surgery

## 2012-12-21 DIAGNOSIS — D62 Acute posthemorrhagic anemia: Secondary | ICD-10-CM | POA: Diagnosis not present

## 2012-12-21 LAB — CBC
Hemoglobin: 11.7 g/dL — ABNORMAL LOW (ref 13.0–17.0)
MCH: 35 pg — ABNORMAL HIGH (ref 26.0–34.0)
MCV: 104.5 fL — ABNORMAL HIGH (ref 78.0–100.0)
Platelets: 123 10*3/uL — ABNORMAL LOW (ref 150–400)
RBC: 3.34 MIL/uL — ABNORMAL LOW (ref 4.22–5.81)
RDW: 14.2 % (ref 11.5–15.5)
WBC: 18 10*3/uL — ABNORMAL HIGH (ref 4.0–10.5)

## 2012-12-21 LAB — BASIC METABOLIC PANEL
BUN: 13 mg/dL (ref 6–23)
CO2: 28 mEq/L (ref 19–32)
Calcium: 8.7 mg/dL (ref 8.4–10.5)
Chloride: 103 mEq/L (ref 96–112)
Glucose, Bld: 191 mg/dL — ABNORMAL HIGH (ref 70–99)
Sodium: 137 mEq/L (ref 135–145)

## 2012-12-21 NOTE — Progress Notes (Signed)
OT Cancellation Note  Patient Details Name: Jeffrey Ewing MRN: [id number] DOB: Jul 16, 1940   Cancelled Treatment:    Reason Eval/Treat Not Completed: OT screened, no needs identified, will sign off. Pt states he has all DME and is familiar with self care tasks from previous surgery. Wife present and verbalizes no concerns. Will sign off per pt request.  Lennox Laity [phone] 12/21/2012, 11:38 AM

## 2012-12-21 NOTE — Evaluation (Signed)
Physical Therapy Evaluation Patient Details Name: Jeffrey Ewing MRN: [id number] DOB: [birth year] Today's Date: 12/21/2012 Time: 7829-5621 PT Time Calculation (min): 27 min  PT Assessment / Plan / Recommendation History of Present Illness  72 y.o. male admitted for R TKA. h/o L TKA 2 years ago.  Clinical Impression  *Pt is s/p TKA resulting in the deficits listed below (see PT Problem List).  Pt will benefit from skilled PT to increase their independence and safety with mobility to allow discharge to the venue listed below.  **    PT Assessment  Patient needs continued PT services    Follow Up Recommendations  Home health PT    Does the patient have the potential to tolerate intense rehabilitation      Barriers to Discharge        Equipment Recommendations  None recommended by PT    Recommendations for Other Services     Frequency 7X/week    Precautions / Restrictions Precautions Precautions: Knee Restrictions Weight Bearing Restrictions: No Other Position/Activity Restrictions: WBAT   Pertinent Vitals/Pain *4/10 R knee Premedicated, ice applied**      Mobility  Bed Mobility Bed Mobility: Supine to Sit Supine to Sit: 6: Modified independent (Device/Increase time);HOB elevated;With rails Transfers Transfers: Sit to Stand;Stand to Sit Sit to Stand: 5: Supervision;From bed Stand to Sit: 5: Supervision;To chair/3-in-1 Details for Transfer Assistance: VCs hand placement Ambulation/Gait Ambulation/Gait Assistance: 4: Min guard Ambulation Distance (Feet): 60 Feet Assistive device: Rolling walker Gait Pattern: Step-to pattern;Antalgic General Gait Details: VCs sequencing    Exercises Total Joint Exercises Ankle Circles/Pumps: AROM;Both;10 reps Quad Sets: AROM;Both;5 reps Heel Slides: AAROM;Right;10 reps   PT Diagnosis: Difficulty walking;Acute pain  PT Problem List: Decreased strength;Decreased range of motion;Decreased mobility;Decreased activity  tolerance;Pain PT Treatment Interventions: Gait training;DME instruction;Stair training;Therapeutic exercise;Therapeutic activities     PT Goals(Current goals can be found in the care plan section) Acute Rehab PT Goals Patient Stated Goal: to ride his bike PT Goal Formulation: With patient/family Time For Goal Achievement: 01/04/13 Potential to Achieve Goals: Good  Visit Information  Last PT Received On: 12/21/12 Assistance Needed: +1 History of Present Illness: 72 y.o. male admitted for R TKA. h/o L TKA 2 years ago.       Prior Functioning  Home Living Family/patient expects to be discharged to:: Private residence Living Arrangements: Spouse/significant other Available Help at Discharge: Family;Available 24 hours/day Type of Home: House Home Access: Stairs to enter [employer] of Steps: 3 Entrance Stairs-Rails: None Home Layout: Able to live on main level with bedroom/bathroom Home Equipment: Walker - 2 wheels;Shower seat;Bedside commode Prior Function Level of Independence: Independent Communication Communication: No difficulties Dominant Hand: Right    Cognition  Cognition Arousal/Alertness: Awake/alert Behavior During Therapy: WFL for tasks assessed/performed Overall Cognitive Status: Within Functional Limits for tasks assessed    Extremity/Trunk Assessment Upper Extremity Assessment Upper Extremity Assessment: Overall WFL for tasks assessed Lower Extremity Assessment Lower Extremity Assessment: RLE deficits/detail RLE Deficits / Details: knee flexion 45* AAROM, ankle WNL, SLR 3/5 Cervical / Trunk Assessment Cervical / Trunk Assessment: Normal   Balance    End of Session PT - End of Session Equipment Utilized During Treatment: Gait belt Activity Tolerance: Patient tolerated treatment well Patient left: in chair;with call bell/phone within reach;with family/visitor present Nurse Communication: Mobility status CPM Right Knee CPM Right Knee: Off   GP     Ralene Bathe Kistler 12/21/2012, 9:48 AM [phone]

## 2012-12-21 NOTE — Progress Notes (Signed)
Physical Therapy Treatment Patient Details Name: Jeffrey Ewing MRN: [id number] DOB: [birth year] Today's Date: 12/21/2012 Time: 4098-1191 PT Time Calculation (min): 15 min  PT Assessment / Plan / Recommendation  History of Present Illness 72 y.o. male admitted for R TKA. h/o L TKA 2 years ago.   PT Comments   *Pt is progressing well with mobility, increased gait distance this afternoon.**  Follow Up Recommendations  Home health PT     Does the patient have the potential to tolerate intense rehabilitation     Barriers to Discharge        Equipment Recommendations  None recommended by PT    Recommendations for Other Services    Frequency 7X/week   Progress towards PT Goals Progress towards PT goals: Progressing toward goals  Plan Current plan remains appropriate    Precautions / Restrictions Precautions Precautions: Knee Restrictions Weight Bearing Restrictions: No Other Position/Activity Restrictions: WBAT   Pertinent Vitals/Pain *7/10 R thigh "where the tourniquet was" Premedicated, ice applied**    Mobility  Bed Mobility Bed Mobility: Supine to Sit;Sit to Supine Supine to Sit: 6: Modified independent (Device/Increase time);HOB elevated;With rails Sit to Supine: 6: Modified independent (Device/Increase time) Details for Bed Mobility Assistance: pt self assisted RLE with LLE Transfers Transfers: Sit to Stand;Stand to Sit Sit to Stand: 5: Supervision;From bed Stand to Sit: 5: Supervision;To bed Details for Transfer Assistance: VCs hand placement Ambulation/Gait Ambulation/Gait Assistance: 5: Supervision Ambulation Distance (Feet): 110 Feet Assistive device: Rolling walker Gait Pattern: Step-to pattern;Antalgic;Step-through pattern General Gait Details: VCs sequencing    Exercises Total Joint Exercises Ankle Circles/Pumps: AROM;Both;10 reps Quad Sets: AROM;Both;5 reps Heel Slides: AAROM;Right;10 reps Long Arc Quad: AAROM;Right;5 reps;Seated Knee Flexion:  AAROM;Right;Seated;5 reps Goniometric ROM: R knee flexion AAROM 50*   PT Diagnosis: Difficulty walking;Acute pain  PT Problem List: Decreased strength;Decreased range of motion;Decreased mobility;Decreased activity tolerance;Pain PT Treatment Interventions: Gait training;DME instruction;Stair training;Therapeutic exercise;Therapeutic activities   PT Goals (current goals can now be found in the care plan section) Acute Rehab PT Goals Patient Stated Goal: to ride his bike PT Goal Formulation: With patient/family Time For Goal Achievement: 01/04/13 Potential to Achieve Goals: Good  Visit Information  Last PT Received On: 12/21/12 Assistance Needed: +1 History of Present Illness: 72 y.o. male admitted for R TKA. h/o L TKA 2 years ago.    Subjective Data  Patient Stated Goal: to ride his bike   Cognition  Cognition Arousal/Alertness: Awake/alert Behavior During Therapy: WFL for tasks assessed/performed Overall Cognitive Status: Within Functional Limits for tasks assessed    Balance     End of Session PT - End of Session Equipment Utilized During Treatment: Gait belt Activity Tolerance: Patient tolerated treatment well Patient left: with call bell/phone within reach;with family/visitor present;in bed Nurse Communication: Mobility status   GP     Ralene Bathe Kistler 12/21/2012, 12:50 PM [phone]

## 2012-12-21 NOTE — Care Management Note (Signed)
    Page 1 of 2   12/22/2012     3:34:17 PM   CARE MANAGEMENT NOTE 12/22/2012  Patient:  Jeffrey Ewing, Jeffrey Ewing   Account Number:  [id number]  Date Initiated:  12/21/2012  Documentation initiated by:  Colleen Can  Subjective/Objective Assessment:   DX: OA rt knee; total knee replacemnt    Referral to [facility] per MD office.     Action/Plan:   CM spoke with patient and spouse. Plans are for patient to return to his home in [place], [place] where spouse will be caregiver. He already has RW, commode seat and shower chair. Wants [facility] for [facility] pt services.   Anticipated DC Date:  12/21/2012   Anticipated DC Plan:  HOME W HOME HEALTH SERVICES      DC Planning Services  CM consult      [facility] Choice  HOME HEALTH   Choice offered to / List presented to:  C-1 Patient        HH arranged  HH-2 PT      [facility] agency  [facility]   Status of service:  Completed, signed off Medicare Important Message given?  NA - LOS <3 / Initial given by admissions (If response is "NO", the following Medicare IM given date fields will be blank) Date Medicare IM given:   Date Additional Medicare IM given:    Discharge Disposition:  HOME W HOME HEALTH SERVICES  Per UR Regulation:    If discussed at Long Length of Stay Meetings, dates discussed:    Comments:  12/22/2012 Colleen Can BSN RN CCM [phone] Pt is requesting that hhpt visits start after noon when he is discharged to home. [facility] notified & states they can arrange to come afternoon and that they will be calling him to set up appointment. Pt advised of the above. Pt discharging to home today.   12/21/2012 Colleen Can BSN RN CCM [phone] [facility] will provide [facility] services day after patient is discharged.

## 2012-12-21 NOTE — Progress Notes (Signed)
   Subjective: 1 Day Post-Op Procedure(s) (LRB): RIGHT TOTAL KNEE ARTHROPLASTY (Right) Patient reports pain as mild.   Patient seen in rounds with Dr. Lequita Halt.  He is doing well this morning. Patient is well, and has had no acute complaints or problems We will start therapy today.  Plan is to go Home after hospital stay.  Objective: Vital signs in last 24 hours: Temp:  [97.1 F (36.2 C)-98.6 F (37 C)] 97.6 F (36.4 C) (09/23 0548) Pulse Rate:  [65-88] 88 (09/23 0548) Resp:  [9-20] 18 (09/23 0723) BP: (104-132)/(57-79) 123/65 mmHg (09/23 0548) SpO2:  [95 %-100 %] 95 % (09/23 0723) Weight:  [86.183 kg (190 lb)] 86.183 kg (190 lb) (09/22 1530)  Intake/Output from previous day:  Intake/Output Summary (Last 24 hours) at 12/21/12 0743 Last data filed at 12/21/12 0600  Gross per 24 hour  Intake 3926.66 ml  Output   2045 ml  Net 1881.66 ml    Intake/Output this shift:    Labs:  Recent Labs  12/21/12 0353  HGB 11.7*    Recent Labs  12/21/12 0353  WBC 18.0*  RBC 3.34*  HCT 34.9*  PLT 123*    Recent Labs  12/21/12 0353  NA 137  K 4.2  CL 103  CO2 28  BUN 13  CREATININE 0.79  GLUCOSE 191*  CALCIUM 8.7   No results found for this basename: LABPT, INR,  in the last 72 hours  EXAM General - Patient is Alert, Appropriate and Oriented Extremity - Neurovascular intact Sensation intact distally Dorsiflexion/Plantar flexion intact Dressing - dressing C/D/I Motor Function - intact, moving foot and toes well on exam.  Hemovac pulled without difficulty.  Past Medical History  Diagnosis Date  . History of prostate cancer 2008  . Hyperlipidemia   . Osteoarthritis   . 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 History of kidney stones   . 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   Weight as of this encounter: 86.183 kg (190 lb). Advance diet Up with therapy Plan for discharge tomorrow Discharge home with home health  DVT Prophylaxis - Xarelto Weight-Bearing as tolerated to right leg No vaccines. D/C O2 and Pulse OX and try on Room [address]  Patrica Duel 12/21/2012, 7:43 AM

## 2012-12-22 LAB — CBC
HCT: 30.9 % — ABNORMAL LOW (ref 39.0–52.0)
MCH: 34.8 pg — ABNORMAL HIGH (ref 26.0–34.0)
Platelets: 112 10*3/uL — ABNORMAL LOW (ref 150–400)
RDW: 14.4 % (ref 11.5–15.5)
WBC: 20.7 10*3/uL — ABNORMAL HIGH (ref 4.0–10.5)

## 2012-12-22 LAB — BASIC METABOLIC PANEL
Chloride: 101 mEq/L (ref 96–112)
GFR calc Af Amer: >90 mL/min (ref >90)
Glucose, Bld: 157 mg/dL — ABNORMAL HIGH (ref 70–99)
Potassium: 4.8 mEq/L (ref 3.5–5.1)
Sodium: 134 mEq/L — ABNORMAL LOW (ref 135–145)

## 2012-12-22 MED ORDER — TRAMADOL HCL 50 MG PO TABS: 50.0000 mg | ORAL_TABLET | Freq: Four times a day (QID) | ORAL | Status: DC | PRN

## 2012-12-22 MED ORDER — RIVAROXABAN 10 MG PO TABS: 10.0000 mg | ORAL_TABLET | Freq: Every day | ORAL | Status: DC

## 2012-12-22 MED ORDER — METHOCARBAMOL 500 MG PO TABS: 500.0000 mg | ORAL_TABLET | Freq: Four times a day (QID) | ORAL | Status: DC | PRN

## 2012-12-22 MED ORDER — OXYCODONE HCL 5 MG PO TABS: 5.0000 mg | ORAL_TABLET | ORAL | Status: DC | PRN

## 2012-12-22 NOTE — Progress Notes (Signed)
   Subjective: 2 Days Post-Op Procedure(s) (LRB): RIGHT TOTAL KNEE ARTHROPLASTY (Right) Patient reports pain as mild.   Patient seen in rounds for Dr. Lequita Halt. Patient is well, and has had no acute complaints or problems Patient is ready to go home  Objective: Vital signs in last 24 hours: Temp:  [97.6 F (36.4 C)-98 F (36.7 C)] 98 F (36.7 C) (09/24 0526) Pulse Rate:  [70-84] 84 (09/24 0526) Resp:  [16-18] 16 (09/24 0526) BP: (105-119)/(62-72) 119/71 mmHg (09/24 0526) SpO2:  [94 %-97 %] 94 % (09/24 0526)  Intake/Output from previous day:  Intake/Output Summary (Last 24 hours) at 12/22/12 0954 Last data filed at 12/22/12 0000  Gross per 24 hour  Intake 819.33 ml  Output   1875 ml  Net -1055.67 ml    Intake/Output this shift:    Labs:  Recent Labs  12/21/12 0353 12/22/12 0408  HGB 11.7* 10.4*    Recent Labs  12/21/12 0353 12/22/12 0408  WBC 18.0* 20.7*  RBC 3.34* 2.99*  HCT 34.9* 30.9*  PLT 123* 112*    Recent Labs  12/21/12 0353 12/22/12 0408  NA 137 134*  K 4.2 4.8  CL 103 101  CO2 28 28  BUN 13 13  CREATININE 0.79 0.88  GLUCOSE 191* 157*  CALCIUM 8.7 9.0   No results found for this basename: LABPT, INR,  in the last 72 hours  EXAM: General - Patient is Alert, Appropriate and Oriented Extremity - Neurovascular intact Sensation intact distally Dorsiflexion/Plantar flexion intact No cellulitis present Incision - clean, dry, no drainage, healing Motor Function - intact, moving foot and toes well on exam.   Assessment/Plan: 2 Days Post-Op Procedure(s) (LRB): RIGHT TOTAL KNEE ARTHROPLASTY (Right) Procedure(s) (LRB): RIGHT TOTAL KNEE ARTHROPLASTY (Right) Past Medical History  Diagnosis Date  . History of prostate cancer 2008  . Hyperlipidemia   . Osteoarthritis   . 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 History of kidney stones   . 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   Weight as of this encounter: 86.183 kg (190 lb). Up with therapy Discharge home with home health Diet - Cardiac diet Follow up - in 2 weeks Activity - WBAT Disposition - Home Condition Upon Discharge - Good D/C Meds - See DC Summary DVT Prophylaxis - Xarelto  PERKINS, ALEXZANDREW 12/22/2012, 9:54 AM

## 2012-12-22 NOTE — Progress Notes (Signed)
Physical Therapy Treatment Patient Details Name: Jeffrey Ewing MRN: [id number] DOB: 1941-01-24 Today's Date: 12/22/2012 Time: 4098-1191 PT Time Calculation (min): 31 min  PT Assessment / Plan / Recommendation  History of Present Illness     PT Comments     Follow Up Recommendations  Home health PT     Does the patient have the potential to tolerate intense rehabilitation     Barriers to Discharge        Equipment Recommendations  None recommended by PT    Recommendations for Other Services OT consult  Frequency 7X/week   Progress towards PT Goals Progress towards PT goals: Progressing toward goals  Plan Current plan remains appropriate    Precautions / Restrictions Precautions Precautions: Knee Required Braces or Orthoses: Knee Immobilizer - Right Knee Immobilizer - Right: Discontinue once straight leg raise with < 10 degree lag Restrictions Weight Bearing Restrictions: No Other Position/Activity Restrictions: WBAT   Pertinent Vitals/Pain 7/10 with activity; RN aware, premed, ice packs provided    Mobility  Bed Mobility Bed Mobility: Supine to Sit Supine to Sit: 5: Supervision;HOB flat Details for Bed Mobility Assistance: pt self assisted RLE with LLE Transfers Transfers: Sit to Stand;Stand to Sit Sit to Stand: 5: Supervision;From bed Stand to Sit: 5: Supervision;To chair/3-in-1;With armrests Details for Transfer Assistance: VCs hand placement Ambulation/Gait Ambulation/Gait Assistance: 5: Supervision Ambulation Distance (Feet): 159 Feet Assistive device: Rolling walker Ambulation/Gait Assistance Details: cues for posture and position from RW Gait Pattern: Step-to pattern;Step-through pattern;Antalgic;Trunk flexed    Exercises Total Joint Exercises Ankle Circles/Pumps: AROM;Both;10 reps Quad Sets: AROM;Both;10 reps Heel Slides: AAROM;Right;10 reps Hip ABduction/ADduction: Right;10 reps;Supine;AAROM Straight Leg Raises: AAROM;Right;10 reps;Supine   PT  Diagnosis:    PT Problem List:   PT Treatment Interventions:     PT Goals (current goals can now be found in the care plan section) Acute Rehab PT Goals Patient Stated Goal: to ride his bike PT Goal Formulation: With patient/family Time For Goal Achievement: 01/04/13 Potential to Achieve Goals: Good  Visit Information  Last PT Received On: 12/22/12 Assistance Needed: +1    Subjective Data  Subjective: I might be going home today Patient Stated Goal: to ride his bike   Cognition  Cognition Arousal/Alertness: Awake/alert Behavior During Therapy: WFL for tasks assessed/performed Overall Cognitive Status: Within Functional Limits for tasks assessed    Balance     End of Session PT - End of Session Equipment Utilized During Treatment: Gait belt Activity Tolerance: Patient tolerated treatment well Patient left: with call bell/phone within reach;with family/visitor present;in bed Nurse Communication: Mobility status CPM Right Knee CPM Right Knee: Off   GP     Jeffrey Ewing 12/22/2012, 10:15 AM

## 2012-12-22 NOTE — Discharge Summary (Signed)
Physician Discharge Summary   Patient ID: Jeffrey Ewing MRN: [id number] DOB/AGE: 06/01/1940 72 y.o.  Admit date: 12/20/2012 Discharge date: 12/22/2012  Primary Diagnosis:  Osteoarthritis Right knee  Admission Diagnoses:  Past Medical History  Diagnosis Date  . History of prostate cancer 2008  . Hyperlipidemia   . Osteoarthritis   . 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 History of kidney stones   . Melanoma 2001    left cheek   Discharge Diagnoses:   Principal Problem:   OA (osteoarthritis) of knee Active Problems:   Postoperative anemia due to acute blood loss  Estimated body mass index is 27.26 kg/(m^2) as calculated from the following:   Height as of this encounter: 5\' 10"  (1.778 m).   Weight as of this encounter: 86.183 kg (190 lb).  Procedure:  Procedure(s) (LRB): RIGHT TOTAL KNEE ARTHROPLASTY (Right)   Consults: None  HPI: Jeffrey Ewing is a 72 y.o. year old male with end stage OA of his right knee with progressively worsening pain and dysfunction. He has constant pain, with activity and at rest and significant functional deficits with difficulties even with ADLs. He has had extensive non-op management including analgesics, injections of cortisone, and home exercise program, but remains in significant pain with significant dysfunction. Radiographs show medial and patellofemoral bone on bone with varus deformity.He presents now for right Total Knee Arthroplasty.   Laboratory Data: Admission on 12/20/2012  Component Date Value Range Status  . WBC 12/21/2012 18.0* 4.0 - 10.5 K/uL Final  . RBC 12/21/2012 3.34* 4.22 - 5.81 MIL/uL Final  . Hemoglobin 12/21/2012 11.7* 13.0 - 17.0 g/dL Final  . HCT 40/98/1191 34.9* 39.0 - 52.0 % Final  . MCV 12/21/2012 104.5* 78.0 - 100.0 fL Final  . MCH 12/21/2012 35.0* 26.0 - 34.0 pg Final  . MCHC 12/21/2012 33.5  30.0 - 36.0 g/dL Final  . RDW 47/82/9562 14.2  11.5 - 15.5 % Final  .  Platelets 12/21/2012 123* 150 - 400 K/uL Final  . Sodium 12/21/2012 137  135 - 145 mEq/L Final  . Potassium 12/21/2012 4.2  3.5 - 5.1 mEq/L Final  . Chloride 12/21/2012 103  96 - 112 mEq/L Final  . CO2 12/21/2012 28  19 - 32 mEq/L Final  . Glucose, Bld 12/21/2012 191* 70 - 99 mg/dL Final  . BUN 13/10/6576 13  6 - 23 mg/dL Final  . Creatinine, Ser 12/21/2012 0.79  0.50 - 1.35 mg/dL Final  . Calcium 46/96/2952 8.7  8.4 - 10.5 mg/dL Final  . GFR calc non Af Amer 12/21/2012 88* >90 mL/min Final  . GFR calc Af Amer 12/21/2012 >90  >90 mL/min Final   Comment: (NOTE)                          The eGFR has been calculated using the CKD EPI equation.                          This calculation has not been validated in all clinical situations.                          eGFR's persistently <90 mL/min signify possible Chronic Kidney  Disease.  . WBC 12/22/2012 20.7* 4.0 - 10.5 K/uL Final  . RBC 12/22/2012 2.99* 4.22 - 5.81 MIL/uL Final  . Hemoglobin 12/22/2012 10.4* 13.0 - 17.0 g/dL Final  . HCT 16/12/9602 30.9* 39.0 - 52.0 % Final  . MCV 12/22/2012 103.3* 78.0 - 100.0 fL Final  . MCH 12/22/2012 34.8* 26.0 - 34.0 pg Final  . MCHC 12/22/2012 33.7  30.0 - 36.0 g/dL Final  . RDW 54/11/8117 14.4  11.5 - 15.5 % Final  . Platelets 12/22/2012 112* 150 - 400 K/uL Final   CONSISTENT WITH PREVIOUS RESULT  . Sodium 12/22/2012 134* 135 - 145 mEq/L Final  . Potassium 12/22/2012 4.8  3.5 - 5.1 mEq/L Final  . Chloride 12/22/2012 101  96 - 112 mEq/L Final  . CO2 12/22/2012 28  19 - 32 mEq/L Final  . Glucose, Bld 12/22/2012 157* 70 - 99 mg/dL Final  . BUN 14/78/2956 13  6 - 23 mg/dL Final  . Creatinine, Ser 12/22/2012 0.88  0.50 - 1.35 mg/dL Final  . Calcium 21/30/8657 9.0  8.4 - 10.5 mg/dL Final  . GFR calc non Af Amer 12/22/2012 84* >90 mL/min Final  . GFR calc Af Amer 12/22/2012 >90  >90 mL/min Final   Comment: (NOTE)                          The eGFR has been calculated using the CKD  EPI equation.                          This calculation has not been validated in all clinical situations.                          eGFR's persistently <90 mL/min signify possible Chronic Kidney                          Disease.  Hospital Outpatient Visit on 12/14/2012  Component Date Value Range Status  . MRSA, PCR 12/14/2012 NEGATIVE  NEGATIVE Final  . Staphylococcus aureus 12/14/2012 NEGATIVE  NEGATIVE Final   Comment:                                 The Xpert SA Assay (FDA                          approved for NASAL specimens                          in patients over 27 years of age),                          is one component of                          a comprehensive surveillance                          program.  Test performance has                          been validated by [employer]  [facility] for patients greater                          than or equal to 47 year old.                          It is not intended                          to diagnose infection nor to                          guide or monitor treatment.  Marland Kitchen aPTT 12/14/2012 30  24 - 37 seconds Final  . WBC 12/14/2012 11.9* 4.0 - 10.5 K/uL Final  . RBC 12/14/2012 4.12* 4.22 - 5.81 MIL/uL Final  . Hemoglobin 12/14/2012 14.3  13.0 - 17.0 g/dL Final  . HCT 16/12/9602 42.6  39.0 - 52.0 % Final  . MCV 12/14/2012 103.4* 78.0 - 100.0 fL Final  . MCH 12/14/2012 34.7* 26.0 - 34.0 pg Final  . MCHC 12/14/2012 33.6  30.0 - 36.0 g/dL Final  . RDW 54/11/8117 14.0  11.5 - 15.5 % Final  . Platelets 12/14/2012 159  150 - 400 K/uL Final  . Sodium 12/14/2012 139  135 - 145 mEq/L Final  . Potassium 12/14/2012 4.5  3.5 - 5.1 mEq/L Final  . Chloride 12/14/2012 103  96 - 112 mEq/L Final  . CO2 12/14/2012 27  19 - 32 mEq/L Final  . Glucose, Bld 12/14/2012 102* 70 - 99 mg/dL Final  . BUN 14/78/2956 20  6 - 23 mg/dL Final  . Creatinine, Ser 12/14/2012 0.97  0.50 - 1.35 mg/dL Final  . Calcium 21/30/8657 9.6  8.4 - 10.5  mg/dL Final  . Total Protein 12/14/2012 6.8  6.0 - 8.3 g/dL Final  . Albumin 84/69/6295 4.1  3.5 - 5.2 g/dL Final  . AST 28/41/3244 19  0 - 37 U/L Final  . ALT 12/14/2012 17  0 - 53 U/L Final  . Alkaline Phosphatase 12/14/2012 85  39 - 117 U/L Final  . Total Bilirubin 12/14/2012 0.6  0.3 - 1.2 mg/dL Final  . GFR calc non Af Amer 12/14/2012 81* >90 mL/min Final  . GFR calc Af Amer 12/14/2012 >90  >90 mL/min Final   Comment: (NOTE)                          The eGFR has been calculated using the CKD EPI equation.                          This calculation has not been validated in all clinical situations.                          eGFR's persistently <90 mL/min signify possible Chronic Kidney                          Disease.  Marland Kitchen Prothrombin Time 12/14/2012 12.9  11.6 - 15.2 seconds Final  . INR 12/14/2012 0.99  0.00 - 1.49 Final  . Color, Urine 12/14/2012 YELLOW  YELLOW Final  . APPearance 12/14/2012 CLEAR  CLEAR Final  . Specific Gravity, Urine 12/14/2012 1.019  1.005 - 1.030 Final  . pH  12/14/2012 6.0  5.0 - 8.0 Final  . Glucose, UA 12/14/2012 NEGATIVE  NEGATIVE mg/dL Final  . Hgb urine dipstick 12/14/2012 NEGATIVE  NEGATIVE Final  . Bilirubin Urine 12/14/2012 NEGATIVE  NEGATIVE Final  . Ketones, ur 12/14/2012 NEGATIVE  NEGATIVE mg/dL Final  . Protein, ur 47/42/5956 NEGATIVE  NEGATIVE mg/dL Final  . Urobilinogen, UA 12/14/2012 0.2  0.0 - 1.0 mg/dL Final  . Nitrite 38/75/6433 NEGATIVE  NEGATIVE Final  . Leukocytes, UA 12/14/2012 NEGATIVE  NEGATIVE Final   MICROSCOPIC NOT DONE ON URINES WITH NEGATIVE PROTEIN, BLOOD, LEUKOCYTES, NITRITE, OR GLUCOSE <1000 mg/dL.  . ABO/RH(D) 12/14/2012 O POS   Final  . Antibody Screen 12/14/2012 NEG   Final  . Sample Expiration 12/14/2012 12/23/2012   Final     X-Rays:Dg Chest 2 View  12/14/2012   CLINICAL DATA:  preop knee surgery  EXAM: CHEST  2 VIEW  COMPARISON:  08/05/2010  FINDINGS: Cardiac and mediastinal contours are normal. The lungs are clear  without infiltrate or effusion. Negative for heart failure mass.  Compression fracture approximately L1 is unchanged from the prior study. Thoracic disc degeneration and spurring also unchanged.  IMPRESSION: No active cardiopulmonary disease.   Electronically Signed   By: Marlan Palau M.D.   On: 12/14/2012 14:39    EKG: Orders placed during the hospital encounter of 12/14/12  . EKG 12-LEAD  . EKG 12-LEAD     Hospital Course: Jeffrey Ewing is a 72 y.o. who was admitted to [facility]. They were brought to the operating room on 12/20/2012 and underwent Procedure(s): RIGHT TOTAL KNEE ARTHROPLASTY.  Patient tolerated the procedure well and was later transferred to the recovery room and then to the orthopaedic floor for postoperative care.  They were given PO and IV analgesics for pain control following their surgery.  They were given 24 hours of postoperative antibiotics of  Anti-infectives   Start     Dose/Rate Route Frequency Ordered Stop   12/20/12 1900  ceFAZolin (ANCEF) IVPB 1 g/50 mL premix     1 g 100 mL/hr over 30 Minutes Intravenous Every 6 hours 12/20/12 1539 12/21/12 0135   12/20/12 0945  ceFAZolin (ANCEF) IVPB 2 g/50 mL premix     2 g 100 mL/hr over 30 Minutes Intravenous On call to O.R. 12/20/12 2951 12/20/12 1240     and started on DVT prophylaxis in the form of Xarelto.   PT and OT were ordered for total joint protocol.  Discharge planning consulted to help with postop disposition and equipment needs.  Patient had a decent night on the evening of surgery.  They started to get up OOB with therapy on day one. Hemovac drain was pulled without difficulty.  Continued to work with therapy into day two.  Dressing was changed on day two and the incision was healing well. Patient was seen in rounds and was ready to go home later on day two.   Discharge Medications: Prior to Admission medications   Medication Sig Start Date End Date Taking? Authorizing Provider  CRESTOR 20 MG  tablet Take 20 mg by mouth every morning.  10/03/11  Yes Historical Provider, MD  acetaminophen (TYLENOL) 500 MG tablet Take 500 mg by mouth every 6 (six) hours as needed for pain.     Historical Provider, MD  dipyridamole-aspirin (AGGRENOX) 200-25 MG per 12 hr capsule Take 1 capsule by mouth 2 (two) times daily.    Historical Provider, MD  methocarbamol (ROBAXIN) 500 MG tablet Take 1 tablet (  500 mg total) by mouth every 6 (six) hours as needed. 12/22/12   Alexzandrew Perkins, PA-C  oxyCODONE (OXY IR/ROXICODONE) 5 MG immediate release tablet Take 1-2 tablets (5-10 mg total) by mouth every 3 (three) hours as needed. 12/22/12   Alexzandrew Julien Girt, PA-C  rivaroxaban (XARELTO) 10 MG TABS tablet Take 1 tablet (10 mg total) by mouth daily with breakfast. Take Xarelto for two and a half more weeks, then discontinue Xarelto. 12/22/12   Alexzandrew Perkins, PA-C  traMADol (ULTRAM) 50 MG tablet Take 1-2 tablets (50-100 mg total) by mouth every 6 (six) hours as needed (mild pain). 12/22/12   Alexzandrew Julien Girt, PA-C    Diet: Cardiac diet Activity:WBAT Follow-up:in 2 weeks Disposition - Home Discharged Condition: good   Discharge Orders   Future Orders Complete By Expires   Call MD / Call 911  As directed    Comments:     If you experience chest pain or shortness of breath, CALL 911 and be transported to the hospital emergency room.  If you develope a fever above 101 F, pus (white drainage) or increased drainage or redness at the wound, or calf pain, call your surgeon's office.   Change dressing  As directed    Comments:     Change dressing daily with sterile 4 x 4 inch gauze dressing and apply TED hose. Do not submerge the incision under water.   Constipation Prevention  As directed    Comments:     Drink plenty of fluids.  Prune juice may be helpful.  You may use a stool softener, such as Colace (over the counter) 100 mg twice a day.  Use MiraLax (over the counter) for constipation as needed.   Diet -  low sodium heart healthy  As directed    Discharge instructions  As directed    Comments:     Pick up stool softner and laxative for home. Do not submerge incision under water. May shower. Continue to use ice for pain and swelling from surgery. Take Xarelto for two and a half more weeks, then discontinue Xarelto.   Do not put a pillow under the knee. Place it under the heel.  As directed    Do not sit on low chairs, stoools or toilet seats, as it may be difficult to get up from low surfaces  As directed    Driving restrictions  As directed    Comments:     No driving until released by the physician.   Increase activity slowly as tolerated  As directed    Lifting restrictions  As directed    Comments:     No lifting until released by the physician.   Patient may shower  As directed    Comments:     You may shower without a dressing once there is no drainage.  Do not wash over the wound.  If drainage remains, do not shower until drainage stops.   TED hose  As directed    Comments:     Use stockings (TED hose) for 3 weeks on both leg(s).  You may remove them at night for sleeping.   Weight bearing as tolerated  As directed        Medication List    STOP taking these medications       ibuprofen 200 MG tablet  Commonly known as:  ADVIL,MOTRIN     naproxen sodium 220 MG tablet  Commonly known as:  ANAPROX      TAKE these medications  acetaminophen 500 MG tablet  Commonly known as:  TYLENOL  Take 500 mg by mouth every 6 (six) hours as needed for pain.     CRESTOR 20 MG tablet  Generic drug:  rosuvastatin  Take 20 mg by mouth every morning.     dipyridamole-aspirin 200-25 MG per 12 hr capsule  Commonly known as:  AGGRENOX  Take 1 capsule by mouth 2 (two) times daily.     methocarbamol 500 MG tablet  Commonly known as:  ROBAXIN  Take 1 tablet (500 mg total) by mouth every 6 (six) hours as needed.     oxyCODONE 5 MG immediate release tablet  Commonly known as:  Oxy  IR/ROXICODONE  Take 1-2 tablets (5-10 mg total) by mouth every 3 (three) hours as needed.     rivaroxaban 10 MG Tabs tablet  Commonly known as:  XARELTO  Take 1 tablet (10 mg total) by mouth daily with breakfast. Take Xarelto for two and a half more weeks, then discontinue Xarelto.     traMADol 50 MG tablet  Commonly known as:  ULTRAM  Take 1-2 tablets (50-100 mg total) by mouth every 6 (six) hours as needed (mild pain).           Follow-up Information   Follow up with Loanne Drilling, MD. Schedule an appointment as soon as possible for a visit in 2 weeks.   Specialty:  Orthopedic Surgery   Contact information:   [address] [place] [address] [phone]       Signed: Patrica Duel 12/22/2012, 9:58 AM

## 2012-12-22 NOTE — Progress Notes (Signed)
Physical Therapy Treatment Patient Details Name: PEREGRINE NOLT MRN: [id number] DOB: [birth year] Today's Date: 12/22/2012 Time: 4098-1191 PT Time Calculation (min): 24 min  PT Assessment / Plan / Recommendation  History of Present Illness     PT Comments   Pt and spouse educated on don/doff KI and stairs with hand out provided  Follow Up Recommendations  Home health PT     Does the patient have the potential to tolerate intense rehabilitation     Barriers to Discharge        Equipment Recommendations  None recommended by PT    Recommendations for Other Services OT consult  Frequency 7X/week   Progress towards PT Goals Progress towards PT goals: Progressing toward goals  Plan Current plan remains appropriate    Precautions / Restrictions Precautions Precautions: Knee Required Braces or Orthoses: Knee Immobilizer - Right Knee Immobilizer - Right: Discontinue once straight leg raise with < 10 degree lag Restrictions Weight Bearing Restrictions: No Other Position/Activity Restrictions: WBAT   Pertinent Vitals/Pain 4/10; premed, ice packs provided    Mobility  Bed Mobility Bed Mobility: Supine to Sit;Sit to Supine Supine to Sit: HOB flat;6: Modified independent (Device/Increase time) Sit to Supine: 6: Modified independent (Device/Increase time) Details for Bed Mobility Assistance: pt self assisted RLE with LLE Transfers Transfers: Sit to Stand;Stand to Sit Sit to Stand: 5: Supervision;From bed Stand to Sit: 5: Supervision;To bed Details for Transfer Assistance: VCs hand placement Ambulation/Gait Ambulation/Gait Assistance: 5: Supervision Ambulation Distance (Feet): 200 Feet Assistive device: Rolling walker Ambulation/Gait Assistance Details: cues for posture, stride length and position from RW Gait Pattern: Step-to pattern;Step-through pattern;Antalgic;Trunk flexed Stairs: Yes Stairs Assistance: 4: Min assist Stairs Assistance Details (indicate cue type and  reason): cues for sequence and foot/RW placement Stair Management Technique: No rails;Backwards;With walker Number of Stairs: 4    Exercises     PT Diagnosis:    PT Problem List:   PT Treatment Interventions:     PT Goals (current goals can now be found in the care plan section) Acute Rehab PT Goals Patient Stated Goal: to ride his bike PT Goal Formulation: With patient/family Time For Goal Achievement: 01/04/13 Potential to Achieve Goals: Good  Visit Information  Last PT Received On: 12/22/12 Assistance Needed: +1    Subjective Data  Patient Stated Goal: to ride his bike   Cognition  Cognition Arousal/Alertness: Awake/alert Behavior During Therapy: WFL for tasks assessed/performed Overall Cognitive Status: Within Functional Limits for tasks assessed    Balance     End of Session PT - End of Session Equipment Utilized During Treatment: Gait belt Activity Tolerance: Patient tolerated treatment well Patient left: with call bell/phone within reach;with family/visitor present;in bed Nurse Communication: Mobility status   GP     Mahkayla Preece 12/22/2012, 2:37 PM

## 2012-12-31 NOTE — Anesthesia Postprocedure Evaluation (Signed)
  Anesthesia Post-op Note  Patient: Jeffrey Ewing  Procedure(s) Performed: Procedure(s) (LRB): RIGHT TOTAL KNEE ARTHROPLASTY (Right)  Patient Location: PACU  Anesthesia Type: Spinal  Level of Consciousness: awake and alert   Airway and Oxygen Therapy: Patient Spontanous Breathing  Post-op Pain: mild  Post-op Assessment: Post-op Vital signs reviewed, Patient's Cardiovascular Status Stable, Respiratory Function Stable, Patent Airway and No signs of Nausea or vomiting  Last Vitals:  Filed Vitals:   12/22/12 0959  BP: 135/59  Pulse: 74  Temp: 36.6 C  Resp: 16    Post-op Vital Signs: stable   Complications: No apparent anesthesia complications

## 2013-05-04 ENCOUNTER — Other Ambulatory Visit: Payer: Self-pay | Admitting: Physician Assistant

## 2013-05-04 DIAGNOSIS — D229 Melanocytic nevi, unspecified: Secondary | ICD-10-CM

## 2013-05-04 HISTORY — DX: Melanocytic nevi, unspecified: D22.9

## 2013-05-19 ENCOUNTER — Other Ambulatory Visit: Payer: Self-pay | Admitting: Physician Assistant

## 2014-01-31 ENCOUNTER — Other Ambulatory Visit: Payer: Self-pay | Admitting: Family Medicine

## 2014-01-31 DIAGNOSIS — Z87891 Personal history of nicotine dependence: Secondary | ICD-10-CM

## 2014-02-07 ENCOUNTER — Other Ambulatory Visit: Payer: Self-pay | Admitting: Family Medicine

## 2014-02-07 ENCOUNTER — Ambulatory Visit
Admission: RE | Admit: 2014-02-07 | Discharge: 2014-02-07 | Disposition: A | Discharge status: Home / Self Care | Payer: Medicare Other | Source: Ambulatory Visit | Attending: Family Medicine | Admitting: Family Medicine

## 2014-02-07 DIAGNOSIS — Z87891 Personal history of nicotine dependence: Secondary | ICD-10-CM

## 2016-02-27 DIAGNOSIS — M6740 Ganglion, unspecified site: Secondary | ICD-10-CM | POA: Insufficient documentation

## 2016-02-27 DIAGNOSIS — M19041 Primary osteoarthritis, right hand: Secondary | ICD-10-CM | POA: Insufficient documentation

## 2016-02-28 ENCOUNTER — Other Ambulatory Visit: Payer: Self-pay | Admitting: Orthopedic Surgery

## 2016-03-28 ENCOUNTER — Encounter (HOSPITAL_BASED_OUTPATIENT_CLINIC_OR_DEPARTMENT_OTHER): Payer: Self-pay | Admitting: *Deleted

## 2016-03-28 NOTE — Progress Notes (Signed)
Surgical clearance note from Dr Maury Dus on chart, to continue aggrenox.

## 2016-04-03 ENCOUNTER — Ambulatory Visit (HOSPITAL_BASED_OUTPATIENT_CLINIC_OR_DEPARTMENT_OTHER): Payer: Medicare Other | Admitting: Anesthesiology

## 2016-04-03 ENCOUNTER — Ambulatory Visit (HOSPITAL_BASED_OUTPATIENT_CLINIC_OR_DEPARTMENT_OTHER)
Admission: RE | Admit: 2016-04-03 | Discharge: 2016-04-03 | Disposition: A | Discharge status: Home / Self Care | Payer: Medicare Other | Source: Ambulatory Visit | Attending: Orthopedic Surgery | Admitting: Orthopedic Surgery

## 2016-04-03 ENCOUNTER — Encounter (HOSPITAL_BASED_OUTPATIENT_CLINIC_OR_DEPARTMENT_OTHER)
Admission: RE | Disposition: A | Discharge status: Home / Self Care | Payer: Self-pay | Source: Ambulatory Visit | Attending: Orthopedic Surgery

## 2016-04-03 ENCOUNTER — Encounter (HOSPITAL_BASED_OUTPATIENT_CLINIC_OR_DEPARTMENT_OTHER): Payer: Self-pay | Admitting: Anesthesiology

## 2016-04-03 DIAGNOSIS — D367 Benign neoplasm of other specified sites: Secondary | ICD-10-CM | POA: Diagnosis present

## 2016-04-03 DIAGNOSIS — Z85820 Personal history of malignant melanoma of skin: Secondary | ICD-10-CM | POA: Diagnosis not present

## 2016-04-03 DIAGNOSIS — E785 Hyperlipidemia, unspecified: Secondary | ICD-10-CM | POA: Diagnosis not present

## 2016-04-03 DIAGNOSIS — Z8673 Personal history of transient ischemic attack (TIA), and cerebral infarction without residual deficits: Secondary | ICD-10-CM | POA: Insufficient documentation

## 2016-04-03 DIAGNOSIS — Z87891 Personal history of nicotine dependence: Secondary | ICD-10-CM | POA: Insufficient documentation

## 2016-04-03 DIAGNOSIS — M67441 Ganglion, right hand: Secondary | ICD-10-CM | POA: Diagnosis not present

## 2016-04-03 DIAGNOSIS — Z96651 Presence of right artificial knee joint: Secondary | ICD-10-CM | POA: Insufficient documentation

## 2016-04-03 DIAGNOSIS — Z8546 Personal history of malignant neoplasm of prostate: Secondary | ICD-10-CM | POA: Diagnosis not present

## 2016-04-03 DIAGNOSIS — M1990 Unspecified osteoarthritis, unspecified site: Secondary | ICD-10-CM | POA: Diagnosis not present

## 2016-04-03 HISTORY — PX: MASS EXCISION: SHX2000

## 2016-04-03 SURGERY — EXCISION MASS
Anesthesia: Monitor Anesthesia Care | Site: Finger | Laterality: Right

## 2016-04-03 MED ORDER — FENTANYL CITRATE (PF) 100 MCG/2ML IJ SOLN
INTRAMUSCULAR | Status: DC | PRN
  Administered 2016-04-03 (×2): 50 ug via INTRAVENOUS

## 2016-04-03 MED ORDER — FENTANYL CITRATE (PF) 100 MCG/2ML IJ SOLN
INTRAMUSCULAR | Status: AC
  Filled 2016-04-03: qty 2

## 2016-04-03 MED ORDER — PROPOFOL 10 MG/ML IV BOLUS
INTRAVENOUS | Status: AC
  Filled 2016-04-03: qty 20

## 2016-04-03 MED ORDER — BUPIVACAINE HCL (PF) 0.25 % IJ SOLN
INTRAMUSCULAR | Status: DC | PRN
  Administered 2016-04-03: 10 mL

## 2016-04-03 MED ORDER — EPHEDRINE 5 MG/ML INJ
INTRAVENOUS | Status: AC
  Filled 2016-04-03: qty 10

## 2016-04-03 MED ORDER — PROMETHAZINE HCL 25 MG/ML IJ SOLN: 6.2500 mg | INTRAMUSCULAR | Status: DC | PRN

## 2016-04-03 MED ORDER — CEFAZOLIN SODIUM-DEXTROSE 2-4 GM/100ML-% IV SOLN
INTRAVENOUS | Status: AC
  Filled 2016-04-03: qty 100

## 2016-04-03 MED ORDER — MIDAZOLAM HCL 2 MG/2ML IJ SOLN: 1.0000 mg | INTRAMUSCULAR | Status: DC | PRN

## 2016-04-03 MED ORDER — HYDROMORPHONE HCL 1 MG/ML IJ SOLN: 0.2500 mg | INTRAMUSCULAR | Status: DC | PRN

## 2016-04-03 MED ORDER — HYDROCODONE-ACETAMINOPHEN 5-325 MG PO TABS: 1.0000 | ORAL_TABLET | Freq: Four times a day (QID) | ORAL | 0 refills | Status: DC | PRN

## 2016-04-03 MED ORDER — LIDOCAINE 2% (20 MG/ML) 5 ML SYRINGE
INTRAMUSCULAR | Status: AC
  Filled 2016-04-03: qty 5

## 2016-04-03 MED ORDER — PROPOFOL 500 MG/50ML IV EMUL
INTRAVENOUS | Status: AC
  Filled 2016-04-03: qty 50

## 2016-04-03 MED ORDER — LACTATED RINGERS IV SOLN
INTRAVENOUS | Status: DC
  Administered 2016-04-03: 10:00:00 via INTRAVENOUS

## 2016-04-03 MED ORDER — ONDANSETRON HCL 4 MG/2ML IJ SOLN
INTRAMUSCULAR | Status: DC | PRN
  Administered 2016-04-03: 4 mg via INTRAVENOUS

## 2016-04-03 MED ORDER — CHLORHEXIDINE GLUCONATE 4 % EX LIQD: 60.0000 mL | Freq: Once | CUTANEOUS | Status: DC

## 2016-04-03 MED ORDER — FENTANYL CITRATE (PF) 100 MCG/2ML IJ SOLN: 50.0000 ug | INTRAMUSCULAR | Status: DC | PRN

## 2016-04-03 MED ORDER — SCOPOLAMINE 1 MG/3DAYS TD PT72: 1.0000 | MEDICATED_PATCH | Freq: Once | TRANSDERMAL | Status: DC | PRN

## 2016-04-03 MED ORDER — ONDANSETRON HCL 4 MG/2ML IJ SOLN
INTRAMUSCULAR | Status: AC
  Filled 2016-04-03: qty 2

## 2016-04-03 MED ORDER — LIDOCAINE HCL (PF) 0.5 % IJ SOLN
INTRAMUSCULAR | Status: DC | PRN
  Administered 2016-04-03: 30 mL via INTRAVENOUS

## 2016-04-03 MED ORDER — CEFAZOLIN SODIUM-DEXTROSE 2-4 GM/100ML-% IV SOLN
2.0000 g | INTRAVENOUS | Status: AC
  Administered 2016-04-03: 2 g via INTRAVENOUS

## 2016-04-03 SURGICAL SUPPLY — 50 items
BANDAGE COBAN STERILE 2 (GAUZE/BANDAGES/DRESSINGS) IMPLANT
BLADE SURG 15 STRL LF DISP TIS (BLADE) ×1 IMPLANT
BLADE SURG 15 STRL SS (BLADE) ×3
BNDG CMPR 9X4 STRL LF SNTH (GAUZE/BANDAGES/DRESSINGS) ×1
BNDG COHESIVE 1X5 TAN STRL LF (GAUZE/BANDAGES/DRESSINGS) IMPLANT
BNDG COHESIVE 3X5 TAN STRL LF (GAUZE/BANDAGES/DRESSINGS) IMPLANT
BNDG ESMARK 4X9 LF (GAUZE/BANDAGES/DRESSINGS) ×2 IMPLANT
BNDG GAUZE ELAST 4 BULKY (GAUZE/BANDAGES/DRESSINGS) ×2 IMPLANT
CHLORAPREP W/TINT 26ML (MISCELLANEOUS) ×3 IMPLANT
CORDS BIPOLAR (ELECTRODE) ×3 IMPLANT
COVER BACK TABLE 60X90IN (DRAPES) ×3 IMPLANT
COVER MAYO STAND STRL (DRAPES) ×3 IMPLANT
CUFF TOURNIQUET SINGLE 18IN (TOURNIQUET CUFF) ×2 IMPLANT
DECANTER SPIKE VIAL GLASS SM (MISCELLANEOUS) IMPLANT
DRAIN PENROSE 1/2X12 LTX STRL (WOUND CARE) IMPLANT
DRAPE EXTREMITY T 121X128X90 (DRAPE) ×3 IMPLANT
DRAPE SURG 17X23 STRL (DRAPES) ×3 IMPLANT
GAUZE SPONGE 4X4 12PLY STRL (GAUZE/BANDAGES/DRESSINGS) ×3 IMPLANT
GAUZE XEROFORM 1X8 LF (GAUZE/BANDAGES/DRESSINGS) ×3 IMPLANT
GLOVE BIOGEL PI IND STRL 7.0 (GLOVE) IMPLANT
GLOVE BIOGEL PI IND STRL 8.5 (GLOVE) ×1 IMPLANT
GLOVE BIOGEL PI INDICATOR 7.0 (GLOVE) ×2
GLOVE BIOGEL PI INDICATOR 8.5 (GLOVE) ×2
GLOVE SURG ORTHO 8.0 STRL STRW (GLOVE) ×3 IMPLANT
GLOVE SURG SYN 7.5  E (GLOVE) ×2
GLOVE SURG SYN 7.5 E (GLOVE) ×1 IMPLANT
GLOVE SURG SYN 7.5 PF PI (GLOVE) IMPLANT
GOWN STRL REUS W/ TWL LRG LVL3 (GOWN DISPOSABLE) ×1 IMPLANT
GOWN STRL REUS W/TWL LRG LVL3 (GOWN DISPOSABLE) ×3
GOWN STRL REUS W/TWL XL LVL3 (GOWN DISPOSABLE) ×3 IMPLANT
NDL BLD DRAW 23GX1  MC LAB (NEEDLE) IMPLANT
NDL PRECISIONGLIDE 27X1.5 (NEEDLE) ×1 IMPLANT
NDL SAFETY ECLIPSE 18X1.5 (NEEDLE) IMPLANT
NEEDLE BLD DRAW 23GX1   MC LAB (NEEDLE) ×2
NEEDLE BLD DRAW 23GX1  MC LAB (NEEDLE) ×1 IMPLANT
NEEDLE HYPO 18GX1.5 SHARP (NEEDLE)
NEEDLE PRECISIONGLIDE 27X1.5 (NEEDLE) ×3 IMPLANT
NS IRRIG 1000ML POUR BTL (IV SOLUTION) ×3 IMPLANT
PACK BASIN DAY SURGERY FS (CUSTOM PROCEDURE TRAY) ×3 IMPLANT
PAD CAST 3X4 CTTN HI CHSV (CAST SUPPLIES) IMPLANT
PADDING CAST COTTON 3X4 STRL (CAST SUPPLIES)
SPLINT PLASTER CAST XFAST 3X15 (CAST SUPPLIES) IMPLANT
SPLINT PLASTER XTRA FASTSET 3X (CAST SUPPLIES)
STOCKINETTE 4X48 STRL (DRAPES) ×3 IMPLANT
SUT ETHILON 4 0 PS 2 18 (SUTURE) ×3 IMPLANT
SUT VIC AB 4-0 P2 18 (SUTURE) IMPLANT
SYR BULB 3OZ (MISCELLANEOUS) ×3 IMPLANT
SYR CONTROL 10ML LL (SYRINGE) ×3 IMPLANT
TOWEL OR 17X24 6PK STRL BLUE (TOWEL DISPOSABLE) ×3 IMPLANT
UNDERPAD 30X30 (UNDERPADS AND DIAPERS) ×3 IMPLANT

## 2016-04-03 NOTE — H&P (Signed)
Jeffrey Ewing is an 76 y.o. male.   Chief Complaint: mass right index finger HPI: Jeffrey Ewing is a 76 year old right-hand-dominant male who is seen for a mass on the PIP joint of his right index finger. States this been present for several months. He complains of mild discomfort with a VAS score 3/10 if he hits the area only. He has no history of injury. He complains of a sharp pain when it occurs. He takes Motrin for arthritis elsewhere. He is on Aggrenox for anticoagulated this is prescribed by Dr. Mariea Clonts. He has no history diabetes thyroid problems or gout. Family history is negative for diabetes thyroid problems arthritis and gout. He has a history of mini stroke melanoma. History of prostate cancer.      Past Medical History:  Diagnosis Date  . 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 History of kidney stones   . History of prostate cancer 2008  . Hyperlipidemia   . Melanoma ([place]) 2001   left cheek  . Osteoarthritis    shoulders, knees  . Stroke [facility]) 2005   "TIA from hole in heart", no deficits    Past Surgical History:  Procedure Laterality Date  . COLONOSCOPY W/ POLYPECTOMY  Oct. 2013  . JOINT REPLACEMENT Bilateral   . KNEE ARTHROSCOPY  2009   left  . PROSTATECTOMY  2008  . removal melanoma  2001   left cheek  . TONSILLECTOMY  as child  . TOTAL KNEE ARTHROPLASTY  2012   left  . TOTAL KNEE ARTHROPLASTY Right 12/20/2012   Procedure: RIGHT TOTAL KNEE ARTHROPLASTY;  Surgeon: Gearlean Alf, MD;  Location: [facility] ORS;  Service: Orthopedics;  Laterality: Right;  Marland Kitchen VASECTOMY  1978    Family History  Problem Relation Age of Onset  . Alzheimer's disease Mother   . Heart attack Father   . Colon cancer Neg Hx   . Stomach cancer Neg Hx   . Esophageal cancer Neg Hx   . Rectal cancer Neg Hx    Social History:  reports that he quit smoking about 12 years ago. His smoking use included Cigarettes. He has a 20.00 pack-year smoking history. He has never used smokeless tobacco. He reports that  he does not drink alcohol or use drugs.  Allergies: No Known Allergies  No prescriptions prior to admission.    No results found for this or any previous visit (from the past 48 hour(s)).  No results found.   Pertinent items are noted in HPI.  Height 5\' 10"  (1.778 m), weight 88.5 kg (195 lb).  General appearance: alert, cooperative and appears stated age Head: Normocephalic, without obvious abnormality Neck: no JVD Resp: clear to auscultation bilaterally Cardio: regular rate and rhythm, S1, S2 normal, no murmur, click, rub or gallop GI: soft, non-tender; bowel sounds normal; no masses,  no organomegaly Extremities: mass right index finger Pulses: 2+ and symmetric Skin: Skin color, texture, turgor normal. No rashes or lesions Neurologic: Grossly normal Incision/Wound: na  Assessment/Plan Assessment:  1. Osteoarthritis of finger of right hand 2. Mucoid cyst, joint Plan: He would like to have this removed. Pre-peri-and postoperative course are discussed along with risks and complications. He is aware that there is no guarantee to the surgery the possibility of infection recurrence injury to arteries nerves tendons incomplete relief of symptoms and dystrophy. Scheduled for excision cyst with debridement of the PIP joint. He is aware that there is a possibility of stiffness in the PIP joint greater than what he  has. Questions are encouraged and answered to his satisfaction. He is scheduled for excision cyst with debridement PIP joint right index finger as an outpatient under regional anesthesia.      Palmer Fahrner R 04/03/2016, 4:42 AM

## 2016-04-03 NOTE — Op Note (Signed)
NAMEKAIPO, BEHNING NO.:  [id number]  MEDICAL RECORD NO.:  [id number]  LOCATION:                                 FACILITY:  PHYSICIAN:  Daryll Brod, M.D.            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  OPERATION:  Excision of mucoid tumor with debridement of osteophytes and dorsal synovectomy of proximal interphalangeal joint, right index finger.  SURGEON:  Daryll Brod, MD.  ANESTHESIA:  Forearm-based IV regional with metacarpal block.  PLACE OF SURGERY:  [facility].  ANESTHESIOLOGIST:  Nelda Severe. Tobias Alexander, M.D.  HISTORY:  The patient is a 76 year old male with a history of a mass over the PIP joint of his right index finger which does transilluminate. X-rays revealed degenerative changes in both PIP and DIP joints.  He is desirous of having this removed.  Pre, peri, and postoperative course have been discussed along with risks and complications.  He is aware that there is no guarantee to the surgery; the possibility of infection; recurrence of injury to arteries, nerves, tendons; incomplete relief of symptoms and dystrophy.  Preoperative area, the patient is seen, the extremity marked by both patient and surgeon.  Antibiotic given.  PROCEDURE IN DETAIL:  The patient was brought to the operating room, where a forearm-based IV regional anesthetic was carried out without difficulty under the direction of the Anesthesia Department.  He was prepped using ChloraPrep in a supine position with the right arm free. A 3-minute dry time was allowed and a time-out taken, confirming the patient and procedure.  A metacarpal block was given  using 0.25% bupivacaine without epinephrine, approximately 9 mL was used.  A curvilinear incision was made over the proximal interphalangeal joint, dorsal radial aspect directly over the area of cystic change.  Bleeders were electrocauterized.  The dissection was carried down to the wall of the cystic mass which was excised in total.  The joint was then opened making an incision with a Beaver blade, through the intersection between the lateral band and central slip.  Degenerative arthritis was immediately apparent to the joint.  A dorsal synovectomy was performed along with removal of osteophytes.  This was done with a hemostatic rongeur.  The specimen was sent to Pathology.  The wound was copiously irrigated with saline.  The incision in the extensor tendon was then repaired with figure-of-eight 5-0 M[place]ilene sutures.  The skin was closed with interrupted 4-0 nylon sutures.  This was done after irrigation of the wound copiously with saline.  A sterile compressive dressing, dorsal splint to the PIP joint was applied.  On deflation of the tourniquet, all fingers immediately pinked.  He was taken to the recovery room for observation in satisfactory condition. He will be discharged to home to return to the [place] in 1 week on Norco.  ______________________________ Daryll Brod, M.D.     GK/MEDQ  D:  04/03/2016  T:  04/03/2016  Job:  [id number]

## 2016-04-03 NOTE — Anesthesia Procedure Notes (Signed)
Procedure Name: MAC Date/Time: 04/03/2016 10:05 AM Performed by: Marrianne Mood Pre-anesthesia Checklist: Patient identified, Timeout performed, Emergency Drugs available, Suction available and Patient being monitored Patient Re-evaluated:Patient Re-evaluated prior to inductionOxygen Delivery Method: Simple face mask

## 2016-04-03 NOTE — Brief Op Note (Signed)
04/03/2016  10:34 AM  PATIENT:  Jeffrey Ewing  77 y.o. 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   POST-OPERATIVE DIAGNOSIS:  mucoid cyst proximal interphalangeal right index finge  PROCEDURE:  Procedure(s) with comments: EXCISION cyst right index finger abd debridement proximal interphalangeal (Right) - FAB  SURGEON:  Surgeon(s) and Role:    * Daryll Brod, MD - Primary  PHYSICIAN ASSISTANT:   ASSISTANTS: none   ANESTHESIA:   local and regional  EBL:  Total I/O In: -  Out: 1 [Blood:1]  BLOOD ADMINISTERED:none  DRAINS: none   LOCAL MEDICATIONS USED:  BUPIVICAINE   SPECIMEN:  Excision  DISPOSITION OF SPECIMEN:  PATHOLOGY  COUNTS:  YES  TOURNIQUET:   Total Tourniquet Time Documented: Upper Arm (Right) - 21 minutes Total: Upper Arm (Right) - 21 minutes   DICTATION: .Other Dictation: Dictation Number [id number]  PLAN OF CARE: Discharge to home after PACU  PATIENT DISPOSITION:  PACU - hemodynamically stable.

## 2016-04-03 NOTE — Anesthesia Preprocedure Evaluation (Addendum)
Anesthesia Evaluation  Patient identified by MRN, date of birth, ID band Patient awake    Reviewed: Allergy & Precautions, H&P , NPO status , Patient's Chart, lab work & pertinent test results  Airway Mallampati: II  TM Distance: >3 FB Neck ROM: Full    Dental no notable dental hx.    Pulmonary neg pulmonary ROS, former smoker,    Pulmonary exam normal breath sounds clear to auscultation       Cardiovascular Normal cardiovascular exam Rhythm:Regular Rate:Normal  Hole in heart?   Neuro/Psych TIAnegative psych ROS   GI/Hepatic negative GI ROS, Neg liver ROS,   Endo/Other  negative endocrine ROS  Renal/GU negative Renal ROS  negative genitourinary   Musculoskeletal negative musculoskeletal ROS (+)   Abdominal   Peds negative pediatric ROS (+)  Hematology negative hematology ROS (+)   Anesthesia Other Findings   Reproductive/Obstetrics negative OB ROS                             Anesthesia Physical  Anesthesia Plan  ASA: II  Anesthesia Plan: MAC and Bier Block   Post-op Pain Management:    Induction: Intravenous  Airway Management Planned: Simple Face Mask and Natural Airway  Additional Equipment:   Intra-op Plan:   Post-operative Plan:   Informed Consent: I have reviewed the patients History and Physical, chart, labs and discussed the procedure including the risks, benefits and alternatives for the proposed anesthesia with the patient or authorized representative who has indicated his/her understanding and acceptance.   Dental advisory given  Plan Discussed with: CRNA and Surgeon  Anesthesia Plan Comments:        Anesthesia Quick Evaluation

## 2016-04-03 NOTE — Transfer of Care (Signed)
Immediate Anesthesia Transfer of Care Note  Patient: Jeffrey Ewing  Procedure(s) Performed: Procedure(s) with comments: EXCISION cyst right index finger abd debridement proximal interphalangeal (Right) - FAB  Patient Location: PACU  Anesthesia Type:Bier block  Level of Consciousness: awake and patient cooperative  Airway & Oxygen Therapy: Patient Spontanous Breathing  Post-op Assessment: Report given to RN and Post -op Vital signs reviewed and stable  Post vital signs: Reviewed and stable  Last Vitals:  Vitals:   04/03/16 0922 04/03/16 1037  BP: (!) 113/58 139/75  Pulse: 76 71  Resp: 18 18  Temp: 37 C     Last Pain:  Vitals:   04/03/16 0922  TempSrc: Oral         Complications: No apparent anesthesia complications

## 2016-04-03 NOTE — Anesthesia Postprocedure Evaluation (Addendum)
Anesthesia Post Note  Patient: Jeffrey Ewing  Procedure(s) Performed: Procedure(s) (LRB): EXCISION cyst right index finger abd debridement proximal interphalangeal (Right)  Patient location during evaluation: PACU Anesthesia Type: MAC Level of consciousness: awake and alert Pain management: pain level controlled Vital Signs Assessment: post-procedure vital signs reviewed and stable Respiratory status: spontaneous breathing and respiratory function stable Cardiovascular status: stable Anesthetic complications: no       Last Vitals:  Vitals:   04/03/16 1059 04/03/16 1130  BP: 118/65 118/63  Pulse: 66 68  Resp: 15 16  Temp: 36.3 C 36.4 C    Last Pain:  Vitals:   04/03/16 1130  TempSrc:   PainSc: 0-No pain                 Jaquell Seddon DANIEL

## 2016-04-03 NOTE — Discharge Instructions (Addendum)
[facility] Instructions °Hand Surgery ° °Wound Care: °Keep your hand elevated above the level of your heart.  Do not allow it to dangle by your side.  Keep the dressing dry and do not remove it unless your doctor advises you to do so.  He will usually change it at the time of your post-op visit.  Moving your fingers is advised to stimulate circulation but will depend on the site of your surgery.  If you have a splint applied, your doctor will advise you regarding movement. ° °Activity: °Do not drive or operate machinery today.  Rest today and then you may return to your normal activity and work as indicated by your physician. ° °Diet:  °Drink liquids today or eat a light diet.  You may resume a regular diet tomorrow.   ° °General expectations: °Pain for two to three days. °Fingers may become slightly swollen. ° °Call your doctor if any of the following occur: °Severe pain not relieved by pain medication. °Elevated temperature. °Dressing soaked with blood. °Inability to move fingers. °White or bluish color to fingers. ° °Regional Anesthesia Blocks ° °1. Numbness or the inability to move the "blocked" extremity may last from 3-48 hours after placement. The length of time depends on the medication injected and your individual response to the medication. If the numbness is not going away after 48 hours, call your surgeon. ° °2. The extremity that is blocked will need to be protected until the numbness is gone and the  Strength has returned. Because you cannot feel it, you will need to take extra care to avoid injury. Because it may be weak, you may have difficulty moving it or using it. You may not know what position it is in without looking at it while the block is in effect. ° °3. For blocks in the legs and feet, returning to weight bearing and walking needs to be done carefully. You will need to wait until the numbness is entirely gone and the strength has returned. You should be able to move your leg and foot  normally before you try and bear weight or walk. You will need someone to be with you when you first try to ensure you do not fall and possibly risk injury. ° °4. Bruising and tenderness at the needle site are common side effects and will resolve in a few days. ° °5. Persistent numbness or new problems with movement should be communicated to the surgeon or the [facility] ([phone])/ [facility] ([phone]). ° °

## 2016-04-03 NOTE — Op Note (Signed)
Dictation Number [id number]

## 2016-04-04 ENCOUNTER — Encounter (HOSPITAL_BASED_OUTPATIENT_CLINIC_OR_DEPARTMENT_OTHER): Payer: Self-pay | Admitting: Orthopedic Surgery

## 2016-08-30 ENCOUNTER — Encounter (HOSPITAL_BASED_OUTPATIENT_CLINIC_OR_DEPARTMENT_OTHER): Payer: Self-pay | Admitting: Orthopedic Surgery

## 2016-08-30 NOTE — Addendum Note (Signed)
Addendum  created 08/30/16 1003 by Duane Boston, MD   Sign clinical note

## 2016-08-30 NOTE — Addendum Note (Signed)
Addendum  created 08/30/16 0752 by Duane Boston, MD   Sign clinical note

## 2016-09-18 ENCOUNTER — Encounter: Payer: Self-pay | Admitting: Gastroenterology

## 2016-10-23 ENCOUNTER — Other Ambulatory Visit: Payer: Self-pay | Admitting: Family Medicine

## 2016-10-23 DIAGNOSIS — R1032 Left lower quadrant pain: Secondary | ICD-10-CM

## 2016-10-25 ENCOUNTER — Ambulatory Visit
Admission: RE | Admit: 2016-10-25 | Discharge: 2016-10-25 | Disposition: A | Discharge status: Home / Self Care | Payer: Medicare Other | Source: Ambulatory Visit | Attending: Family Medicine | Admitting: Family Medicine

## 2016-10-25 DIAGNOSIS — R1032 Left lower quadrant pain: Secondary | ICD-10-CM

## 2017-02-13 ENCOUNTER — Encounter: Payer: Self-pay | Admitting: Gastroenterology

## 2017-03-26 ENCOUNTER — Other Ambulatory Visit: Payer: Self-pay | Admitting: Acute Care

## 2017-03-26 DIAGNOSIS — Z122 Encounter for screening for malignant neoplasm of respiratory organs: Secondary | ICD-10-CM

## 2017-03-26 DIAGNOSIS — Z87891 Personal history of nicotine dependence: Secondary | ICD-10-CM

## 2017-03-26 NOTE — Progress Notes (Signed)
Chest  

## 2017-04-08 ENCOUNTER — Encounter: Payer: Self-pay | Admitting: Acute Care

## 2017-04-08 ENCOUNTER — Ambulatory Visit (INDEPENDENT_AMBULATORY_CARE_PROVIDER_SITE_OTHER): Payer: Medicare Other | Admitting: Acute Care

## 2017-04-08 ENCOUNTER — Ambulatory Visit (INDEPENDENT_AMBULATORY_CARE_PROVIDER_SITE_OTHER)
Admission: RE | Admit: 2017-04-08 | Discharge: 2017-04-08 | Disposition: A | Discharge status: Home / Self Care | Payer: Medicare Other | Source: Ambulatory Visit | Attending: Acute Care | Admitting: Acute Care

## 2017-04-08 DIAGNOSIS — Z87891 Personal history of nicotine dependence: Secondary | ICD-10-CM

## 2017-04-08 DIAGNOSIS — Z122 Encounter for screening for malignant neoplasm of respiratory organs: Secondary | ICD-10-CM

## 2017-04-08 NOTE — Progress Notes (Signed)
Shared Decision Making Visit Lung Cancer Screening Program [phone])   Eligibility:  Age 77 y.o.  Pack Years Smoking History Calculation 41 pack year smoking history (# packs/per year x # years smoked)  Recent History of coughing up blood  no  Unexplained weight loss? no ( >Than 15 pounds within the last 6 months )  Prior History Lung / other cancer no (Diagnosis within the last 5 years already requiring surveillance chest CT Scans).  Smoking Status Former Smoker  Former Smokers: Years since quit: 14 years  Quit Date: 11/05/2003  Visit Components:  Discussion included one or more decision making aids. yes  Discussion included risk/benefits of screening. yes  Discussion included potential follow up diagnostic testing for abnormal scans. yes  Discussion included meaning and risk of over diagnosis. yes  Discussion included meaning and risk of False Positives. yes  Discussion included meaning of total radiation exposure. yes  Counseling Included:  Importance of adherence to annual lung cancer LDCT screening. yes  Impact of comorbidities on ability to participate in the program. yes  Ability and willingness to under diagnostic treatment. yes  Smoking Cessation Counseling:  Current Smokers:   Discussed importance of smoking cessation.NA  Information about tobacco cessation classes and interventions provided to patient. yes  Patient provided with "ticket" for LDCT Scan. yes  Symptomatic Patient. no  Counseling  Diagnosis Code: Tobacco Use Z72.0  Asymptomatic Patient yes  Counseling (Intermediate counseling: > three minutes counseling) X9147  Former Smokers:   Discussed the importance of maintaining cigarette abstinence. yes  Diagnosis Code: Personal History of Nicotine Dependence. W29.562  Information about tobacco cessation classes and interventions provided to patient. Yes  Patient provided with "ticket" for LDCT Scan. yes  Written Order for Lung  Cancer Screening with LDCT placed in Epic. Yes (CT Chest Lung Cancer Screening Low Dose W/O CM) [id number] Z12.2-Screening of respiratory organs Z87.891-Personal history of nicotine dependence  I spent 25 minutes of face to face time with Jeffrey Ewing discussing the risks and benefits of lung cancer screening. We viewed a power point together that explained in detail the above noted topics. We took the time to pause the power point at intervals to allow for questions to be asked and answered to ensure understanding. We discussed that he had taken the single most powerful action possible to decrease his risk of developing lung cancer when he quit smoking. I counseled him to remain smoke free, and to contact me if he ever had the desire to smoke again so that I can provide resources and tools to help support the effort to remain smoke free. We discussed the time and location of the scan, and that either  [name] RN or I will call with the results within  24-48 hours of receiving them. He  has my card and contact information in the event he needs to speak with me, in addition to a copy of the power point we reviewed as a resource. He verbalized understanding of all of the above and had no further questions upon leaving the office.     I explained to the patient that there has been a high incidence of coronary artery disease noted on these exams. I explained that this is a non-gated exam therefore degree or severity cannot be determined. This patient is currently on statin therapy. I have asked the patient to follow-up with their PCP regarding any incidental finding of coronary artery disease and management with diet or medication as they feel is clinically  indicated. The patient verbalized understanding of the above and had no further questions.     Magdalen Spatz, NP 04/08/2017

## 2017-04-10 ENCOUNTER — Ambulatory Visit: Payer: Medicare Other | Admitting: Gastroenterology

## 2017-04-10 ENCOUNTER — Encounter: Payer: Self-pay | Admitting: Gastroenterology

## 2017-04-10 VITALS — BP 136/74 | HR 80 | Ht 70.0 in | Wt 202.0 lb

## 2017-04-10 DIAGNOSIS — Z86010 Personal history of colonic polyps: Secondary | ICD-10-CM | POA: Diagnosis not present

## 2017-04-10 MED ORDER — PEG-KCL-NACL-NASULF-NA ASC-C 140 G PO SOLR: 140.0000 g | ORAL | 0 refills | Status: DC

## 2017-04-10 NOTE — Progress Notes (Signed)
[facility] Consult Note:  History: Jeffrey Ewing 04/10/2017  Referring physician: Maury Dus, MD  Reason for consult/chief complaint: Colon Polyps (Discuss recall colon, pt has TA polyps in 2013. Pt is on aggrenox.Pt denies any GI sx.)   Subjective  HPI:  This is a 77 year old man last seen for colonoscopy in October 2013 by Dr. Maurene Capes, 2 subcentimeter adenomatous polyps were removed. He is feeling well, and denies abdominal pain, altered bowel habits or rectal bleeding.  He denies dysphagia, nausea, vomiting or anorexia. He has been maintained on daily Aggrenox since a TIA in 2005.  ROS:  Review of Systems  He denies chest pain or dyspnea.    Past Medical History: Past Medical History:  Diagnosis Date  . 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 History of kidney stones   . History of prostate cancer 2008  . Hyperlipidemia   . Melanoma ([place]) 2001   left cheek  . Osteoarthritis    shoulders, knees  . 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 COLONOSCOPY W/ POLYPECTOMY  Oct. 2013  . JOINT REPLACEMENT Bilateral   . KNEE ARTHROSCOPY  2009   left  . MASS EXCISION Right 04/03/2016   Procedure: EXCISION cyst right index finger abd debridement proximal interphalangeal;  Surgeon: Daryll Brod, MD;  Location: [facility];  Service: Orthopedics;  Laterality: Right;  FAB  . PROSTATECTOMY  2008  . removal melanoma  2001   left cheek  . TONSILLECTOMY  as child  . TOTAL KNEE ARTHROPLASTY  2012   left  . TOTAL KNEE ARTHROPLASTY Right 12/20/2012   Procedure: RIGHT TOTAL KNEE ARTHROPLASTY;  Surgeon: Gearlean Alf, MD;  Location: [facility];  Service: Orthopedics;  Laterality: Right;  Marland Kitchen VASECTOMY  1978     Family History: Family History  Problem Relation Age of Onset  . Alzheimer's disease Mother   . Heart attack Father   . Colon cancer Neg Hx     . Stomach cancer Neg Hx   . Esophageal cancer Neg Hx   . Rectal cancer Neg Hx     Social History: Social History   Socioeconomic History  . Marital status: Married    Spouse name: None  . Number of children: None  . Years of education: None  . Highest education level: None  Social Needs  . Financial resource strain: None  . Food insecurity - worry: None  . Food insecurity - inability: None  . Transportation needs - medical: None  . Transportation needs - non-medical: None  Occupational History  . None  Tobacco Use  . Smoking status: Former Smoker    Packs/day: 1.00    Years: 41.00    Pack years: 41.00    Types: Cigarettes    Last attempt to quit: 11/05/2003    Years since quitting: 13.4  . Smokeless tobacco: Never Used  Substance and Sexual Activity  . Alcohol use: No    Alcohol/week: 0.0 oz  . Drug use: No  . Sexual activity: None  Other Topics Concern  . None  Social History Narrative  . None    Allergies: No Known Allergies  Outpatient Meds: Current Outpatient Medications  Medication Sig Dispense Refill  . CRESTOR 20 MG tablet Take 20 mg by mouth every morning.     . dipyridamole-aspirin (AGGRENOX) 200-25 MG per 12 hr  capsule Take 1 capsule by mouth 2 (two) times daily.    Marland Kitchen ibuprofen (ADVIL,MOTRIN) 200 MG tablet Take 200 mg by mouth every 6 (six) hours as needed.    Marland Kitchen PEG-KCl-NaCl-NaSulf-Na Asc-C (PLENVU) 140 g SOLR Take 140 g by mouth as directed. 1 each 0   No current facility-administered medications for this visit.       ___________________________________________________________________ Objective   Exam:  BP 136/74   Pulse 80   Ht 5\' 10"  (1.778 m)   Wt 202 lb (91.6 kg)   BMI 28.98 kg/m    General: this is a(n) well-appearing man  Eyes: sclera anicteric, no redness  ENT: oral mucosa moist without lesions, no cervical or supraclavicular lymphadenopathy, good dentition  CV: RRR without murmur, S1/S2, no JVD, no peripheral  edema  Resp: clear to auscultation bilaterally, normal RR and effort noted  GI: soft, no tenderness, with active bowel sounds. No guarding or palpable organomegaly noted.  Skin; warm and dry, no rash or jaundice noted  Neuro: awake, alert and oriented x 3. Normal gross motor function and fluent speech   Assessment: Encounter Diagnosis  Name Primary?  . History of colonic polyps Yes    Surveillance colonoscopy scheduled.  No Aggrenox 5 days prior, I think the risk of TIA or CVA during that time is low.  20-minute visit, over half spent in counseling coordinating care.  Thank you for the courtesy of this consult.  Please call me with any questions or concerns.  Nelida Meuse III  CC: Maury Dus, MD

## 2017-04-10 NOTE — Patient Instructions (Signed)
If you are age 77 or older, your body mass index should be between 23-30. Your Body mass index is 28.98 kg/m. If this is out of the aforementioned range listed, please consider follow up with your Primary Care Provider.  If you are age 63 or younger, your body mass index should be between 19-25. Your Body mass index is 28.98 kg/m. If this is out of the aformentioned range listed, please consider follow up with your Primary Care Provider.   You have been scheduled for a colonoscopy. Please follow written instructions given to you at your visit today.  Please pick up your prep supplies at the pharmacy within the next 1-3 days. If you use inhalers (even only as needed), please bring them with you on the day of your procedure. Your physician has requested that you go to www.startemmi.com and enter the access code given to you at your visit today. This web site gives a general overview about your procedure. However, you should still follow specific instructions given to you by our office regarding your preparation for the procedure.  You will be contacted by our office prior to your procedure for directions on holding your Aggrenox. If you do not hear from our office 1 week prior to your scheduled procedure, please call [phone] to discuss.   Thank you for choosing [facility]  Dr Wilfrid Lund III

## 2017-04-13 ENCOUNTER — Other Ambulatory Visit: Payer: Self-pay

## 2017-04-14 ENCOUNTER — Other Ambulatory Visit: Payer: Self-pay | Admitting: Acute Care

## 2017-04-14 DIAGNOSIS — Z122 Encounter for screening for malignant neoplasm of respiratory organs: Secondary | ICD-10-CM

## 2017-04-14 DIAGNOSIS — Z87891 Personal history of nicotine dependence: Secondary | ICD-10-CM

## 2017-04-23 ENCOUNTER — Encounter: Payer: Self-pay | Admitting: Gastroenterology

## 2017-05-07 ENCOUNTER — Ambulatory Visit (AMBULATORY_SURGERY_CENTER): Payer: Medicare Other | Admitting: Gastroenterology

## 2017-05-07 ENCOUNTER — Other Ambulatory Visit: Payer: Self-pay

## 2017-05-07 ENCOUNTER — Encounter: Payer: Self-pay | Admitting: Gastroenterology

## 2017-05-07 ENCOUNTER — Telehealth: Payer: Self-pay | Admitting: Internal Medicine

## 2017-05-07 VITALS — BP 101/72 | HR 91 | Temp 98.9°F | Resp 18 | Ht 70.0 in | Wt 202.0 lb

## 2017-05-07 DIAGNOSIS — Z860100 Personal history of colon polyps, unspecified: Secondary | ICD-10-CM

## 2017-05-07 DIAGNOSIS — D123 Benign neoplasm of transverse colon: Secondary | ICD-10-CM

## 2017-05-07 DIAGNOSIS — D122 Benign neoplasm of ascending colon: Secondary | ICD-10-CM

## 2017-05-07 DIAGNOSIS — D120 Benign neoplasm of cecum: Secondary | ICD-10-CM | POA: Diagnosis not present

## 2017-05-07 MED ORDER — SODIUM CHLORIDE 0.9 % IV SOLN: 500.0000 mL | Freq: Once | INTRAVENOUS | Status: DC

## 2017-05-07 NOTE — Telephone Encounter (Signed)
Colonoscopy with cold and hot snare polypectomy today. Had chills, rigors. Resolved. Then similar sxs and Temp 102 F. Absolutely no other problems like abdominal pain, nausea, vomiting. Mild cough, no dyspnea and feels ok otherwise.  Has taken 2 Tylenol.  Advised that she/he monitor things and if worse, abd pain, other sxs or persistent fever let us know - if any extreme problems head to ED. She is aware that [place] will call again in AM

## 2017-05-07 NOTE — Progress Notes (Signed)
Report given to PACU, vss 

## 2017-05-07 NOTE — Patient Instructions (Addendum)
YOU HAD AN ENDOSCOPIC PROCEDURE TODAY AT [facility]:   Refer to the procedure report that was given to you for any specific questions about what was found during the examination.  If the procedure report does not answer your questions, please call your gastroenterologist to clarify.  If you requested that your care partner not be given the details of your procedure findings, then the procedure report has been included in a sealed envelope for you to review at your convenience later.  YOU SHOULD EXPECT: Some feelings of bloating in the abdomen. Passage of more gas than usual.  Walking can help get rid of the air that was put into your GI tract during the procedure and reduce the bloating. If you had a lower endoscopy (such as a colonoscopy or flexible sigmoidoscopy) you may notice spotting of blood in your stool or on the toilet paper. If you underwent a bowel prep for your procedure, you may not have a normal bowel movement for a few days.  Please Note:  You might notice some irritation and congestion in your nose or some drainage.  This is from the oxygen used during your procedure.  There is no need for concern and it should clear up in a day or so.  SYMPTOMS TO REPORT IMMEDIATELY:   Following lower endoscopy (colonoscopy or flexible sigmoidoscopy):  Excessive amounts of blood in the stool  Significant tenderness or worsening of abdominal pains  Swelling of the abdomen that is new, acute  Fever of 100F or higher   For urgent or emergent issues, a gastroenterologist can be reached at any hour by calling [phone].   DIET:  We do recommend a small meal at first, but then you may proceed to your regular diet.  Drink plenty of fluids but you should avoid alcoholic beverages for 24 hours.  ACTIVITY:  You should plan to take it easy for the rest of today and you should NOT DRIVE or use heavy machinery until tomorrow (because of the sedation medicines used during the test).     FOLLOW UP: Our staff will call the number listed on your records the next business day following your procedure to check on you and address any questions or concerns that you may have regarding the information given to you following your procedure. If we do not reach you, we will leave a message.  However, if you are feeling well and you are not experiencing any problems, there is no need to return our call.  We will assume that you have returned to your regular daily activities without incident. NO NSAIDS : ASPIRIN, ALEVE AND IBUPROFEN FOR 1 WEEK TO REDUCE THE CHANCES OF BLEEDING.  YOU MAY RESUME YOUR AGGRENOX TOMORROW.  If any biopsies were taken you will be contacted by phone or by letter within the next 1-3 weeks.  Please call us at [phone] if you have not heard about the biopsies in 3 weeks.    SIGNATURES/CONFIDENTIALITY: You and/or your care partner have signed paperwork which will be entered into your electronic medical record.  These signatures attest to the fact that that the information above on your After Visit Summary has been reviewed and is understood.  Full responsibility of the confidentiality of this discharge information lies with you and/or your care-partner.  Read all of the handouts given to you by your recovery room nurse.

## 2017-05-07 NOTE — Progress Notes (Signed)
Called to room to assist during endoscopic procedure.  Patient ID and intended procedure confirmed with present staff. Received instructions for my participation in the procedure from the performing physician.  

## 2017-05-07 NOTE — Op Note (Signed)
Jeffrey Ewing Procedure Date: 05/07/2017 3:27 PM MRN: [id number] Endoscopist: Mallie Mussel L. [name] , MD Age: 77 Referring MD:  Date of Birth: July 25, 1940 Gender: Male Account #: [id number] Procedure:                Colonoscopy Indications:              Surveillance: Personal history of adenomatous                            polyps on last colonoscopy 5 years ago (TA x 2 <                            6mm each; 12/2011) Medicines:                Monitored Anesthesia Care Procedure:                Pre-Anesthesia Assessment:                           - Prior to the procedure, a History and Physical                            was performed, and patient medications and                            allergies were reviewed. The patient's tolerance of                            previous anesthesia was also reviewed. The risks                            and benefits of the procedure and the sedation                            options and risks were discussed with the patient.                            All questions were answered, and informed consent                            was obtained. Prior Anticoagulants: The patient has                            taken aspirin/dipyridimole, last dose was 5 days                            prior to procedure. ASA Grade Assessment: III - A                            patient with severe systemic disease. After                            reviewing the risks and benefits, the patient was  deemed in satisfactory condition to undergo the                            procedure.                           After obtaining informed consent, the colonoscope                            was passed under direct vision. Throughout the                            procedure, the patient's blood pressure, pulse, and                            oxygen saturations were monitored continuously. The   Colonoscope was introduced through the anus and                            advanced to the the cecum, identified by                            appendiceal orifice and ileocecal valve. The                            colonoscopy was performed with difficulty due to                            multiple diverticula in the colon, significant                            looping and a tortuous colon. Successful completion                            of the procedure was aided by using manual                            pressure. The patient tolerated the procedure well.                            The quality of the bowel preparation was good. The                            ileocecal valve, appendiceal orifice, and rectum                            were photographed. The quality of the bowel                            preparation was evaluated using the BBPS [facility]                            Bowel Preparation Scale) with scores of: Right  Colon = 2, Transverse Colon = 2 and Left Colon = 2.                            The total BBPS score equals 6. Scope In: 3:57:03 PM Scope Out: 4:22:13 PM Scope Withdrawal Time: 0 hours 20 minutes 34 seconds  Total Procedure Duration: 0 hours 25 minutes 10 seconds  Findings:                 The perianal and digital rectal examinations were                            normal.                           A 2 mm polyp was found in the cecum. The polyp was                            sessile. The polyp was removed with a cold biopsy                            forceps. Resection and retrieval were complete.                           A 4 mm polyp was found in the cecum. The polyp was                            sessile. The polyp was removed with a cold snare.                            Resection and retrieval were complete.( Both cecal                            polyps in Jar 1)                           A 4 mm polyp was found in the proximal  ascending                            colon. The polyp was sessile. The polyp was removed                            with a cold snare. Resection and retrieval were                            complete.                           Two sessile polyps were found in the transverse                            colon. The polyps were 8 to 10 mm in size. These  polyps were removed with a hot snare. Resection and                            retrieval were complete. (A.C. and both T.C. polyps                            in Jar 2)                           Many diverticula were found in the left colon and a                            few in the right colon.                           The sigmoid colon was moderately redundant and                            tortuous.                           Internal hemorrhoids were found. The hemorrhoids                            were large and Grade I (internal hemorrhoids that                            do not prolapse).                           The exam was otherwise without abnormality on                            direct and retroflexion views. Complications:            No immediate complications. Estimated Blood Loss:     Estimated blood loss was minimal. Impression:               - One 2 mm polyp in the cecum, removed with a cold                            biopsy forceps. Resected and retrieved.                           - One 4 mm polyp in the cecum, removed with a cold                            snare. Resected and retrieved.                           - One 4 mm polyp in the proximal ascending colon,                            removed with a cold snare. Resected and retrieved.                           -  Two 8 to 10 mm polyps in the transverse colon,                            removed with a hot snare. Resected and retrieved.                           - Diverticulosis in the left colon and in the right                             colon.                           - Redundant colon.                           - Internal hemorrhoids.                           - The examination was otherwise normal on direct                            and retroflexion views. Recommendation:           - Patient has a contact number available for                            emergencies. The signs and symptoms of potential                            delayed complications were discussed with the                            patient. Return to normal activities tomorrow.                            Written discharge instructions were provided to the                            patient.                           - Resume previous diet.                           - Resume aspirin/dipyridimole (Aggrenox) at prior                            dose tomorrow.                           - Await pathology results.                           - Repeat colonoscopy is recommended for                            surveillance. The colonoscopy date will be  determined after pathology results from today's                            exam become available for review. Jeffrey L. Loletha Carrow, MD 05/07/2017 4:34:43 PM This report has been signed electronically.

## 2017-05-08 ENCOUNTER — Telehealth: Payer: Self-pay

## 2017-05-08 NOTE — Telephone Encounter (Signed)
  Follow up Call-  Call back number 05/07/2017  Post procedure Call Back phone  # [phone]  Permission to leave phone message Yes  Some recent data might be hidden     Patient questions:  Do you have a fever, pain , or abdominal swelling? Yes.   Pain Score  0 * Chills and fever of 102 last night.  Called provider number and was advised via telephone to take Tylenol.  Temp this am is 98.1  Wife, Izora Gala is very concerned that he is going to spike a fever again.  No chills at the present time and pt has voided this am (he was originally concerned about dehydration).  Advised wife to call us again if symptoms worsen or new ones arise.  Have you tolerated food without any problems? Yes.    Have you been able to return to your normal activities? Yes.    Do you have any questions about your discharge instructions: Diet   No. Medications  No. Follow up visit  No.  Do you have questions or concerns about your Care? Yes.    Actions: * If pain score is 4 or above: No action needed, pain <4.

## 2017-05-08 NOTE — Telephone Encounter (Signed)
Occasional late side effect of the propofol.  I expect it will pass.  Tylenol as needed

## 2017-05-08 NOTE — Telephone Encounter (Signed)
Got it, thanks.  [facility] has reached out to him.

## 2017-05-15 ENCOUNTER — Encounter: Payer: Self-pay | Admitting: Gastroenterology

## 2017-05-26 DIAGNOSIS — M19019 Primary osteoarthritis, unspecified shoulder: Secondary | ICD-10-CM | POA: Insufficient documentation

## 2017-06-10 DIAGNOSIS — D126 Benign neoplasm of colon, unspecified: Secondary | ICD-10-CM | POA: Insufficient documentation

## 2017-06-10 DIAGNOSIS — E785 Hyperlipidemia, unspecified: Secondary | ICD-10-CM | POA: Insufficient documentation

## 2017-06-10 DIAGNOSIS — Z0000 Encounter for general adult medical examination without abnormal findings: Secondary | ICD-10-CM | POA: Insufficient documentation

## 2017-06-10 DIAGNOSIS — G459 Transient cerebral ischemic attack, unspecified: Secondary | ICD-10-CM | POA: Insufficient documentation

## 2017-06-10 DIAGNOSIS — M79646 Pain in unspecified finger(s): Secondary | ICD-10-CM | POA: Insufficient documentation

## 2017-06-24 DIAGNOSIS — M19049 Primary osteoarthritis, unspecified hand: Secondary | ICD-10-CM | POA: Insufficient documentation

## 2017-12-17 DIAGNOSIS — M25519 Pain in unspecified shoulder: Secondary | ICD-10-CM | POA: Insufficient documentation

## 2018-04-13 ENCOUNTER — Ambulatory Visit (INDEPENDENT_AMBULATORY_CARE_PROVIDER_SITE_OTHER)
Admission: RE | Admit: 2018-04-13 | Discharge: 2018-04-13 | Disposition: A | Discharge status: Home / Self Care | Payer: Medicare Other | Source: Ambulatory Visit | Attending: Acute Care | Admitting: Acute Care

## 2018-04-13 DIAGNOSIS — Z122 Encounter for screening for malignant neoplasm of respiratory organs: Secondary | ICD-10-CM

## 2018-04-13 DIAGNOSIS — Z87891 Personal history of nicotine dependence: Secondary | ICD-10-CM

## 2018-04-16 DIAGNOSIS — I700 Atherosclerosis of aorta: Secondary | ICD-10-CM | POA: Insufficient documentation

## 2018-06-14 DIAGNOSIS — D696 Thrombocytopenia, unspecified: Secondary | ICD-10-CM | POA: Insufficient documentation

## 2018-06-14 DIAGNOSIS — R3121 Asymptomatic microscopic hematuria: Secondary | ICD-10-CM | POA: Insufficient documentation

## 2018-06-14 DIAGNOSIS — R030 Elevated blood-pressure reading, without diagnosis of hypertension: Secondary | ICD-10-CM | POA: Insufficient documentation

## 2018-06-24 DIAGNOSIS — K5730 Diverticulosis of large intestine without perforation or abscess without bleeding: Secondary | ICD-10-CM | POA: Insufficient documentation

## 2018-06-24 DIAGNOSIS — N200 Calculus of kidney: Secondary | ICD-10-CM | POA: Insufficient documentation

## 2018-10-04 DIAGNOSIS — H60509 Unspecified acute noninfective otitis externa, unspecified ear: Secondary | ICD-10-CM | POA: Insufficient documentation

## 2018-10-15 DIAGNOSIS — C4330 Malignant melanoma of unspecified part of face: Secondary | ICD-10-CM | POA: Insufficient documentation

## 2018-10-15 DIAGNOSIS — R42 Dizziness and giddiness: Secondary | ICD-10-CM | POA: Insufficient documentation

## 2018-10-18 ENCOUNTER — Other Ambulatory Visit: Payer: Self-pay | Admitting: Otolaryngology

## 2018-10-18 DIAGNOSIS — C4330 Malignant melanoma of unspecified part of face: Secondary | ICD-10-CM

## 2018-10-18 DIAGNOSIS — R42 Dizziness and giddiness: Secondary | ICD-10-CM

## 2018-11-09 ENCOUNTER — Encounter (INDEPENDENT_AMBULATORY_CARE_PROVIDER_SITE_OTHER): Payer: Self-pay

## 2018-11-09 ENCOUNTER — Ambulatory Visit
Admission: RE | Admit: 2018-11-09 | Discharge: 2018-11-09 | Disposition: A | Discharge status: Home / Self Care | Payer: Medicare Other | Source: Ambulatory Visit | Attending: Otolaryngology | Admitting: Otolaryngology

## 2018-11-09 ENCOUNTER — Other Ambulatory Visit: Payer: Medicare Other

## 2018-11-09 DIAGNOSIS — R42 Dizziness and giddiness: Secondary | ICD-10-CM

## 2018-11-09 DIAGNOSIS — C4330 Malignant melanoma of unspecified part of face: Secondary | ICD-10-CM

## 2018-11-09 MED ORDER — GADOBENATE DIMEGLUMINE 529 MG/ML IV SOLN
17.0000 mL | Freq: Once | INTRAVENOUS | Status: AC | PRN
  Administered 2018-11-09: 17:00:00 17 mL via INTRAVENOUS

## 2018-12-07 ENCOUNTER — Other Ambulatory Visit: Payer: Medicare Other

## 2019-01-21 DIAGNOSIS — I951 Orthostatic hypotension: Secondary | ICD-10-CM | POA: Insufficient documentation

## 2019-01-21 DIAGNOSIS — R011 Cardiac murmur, unspecified: Secondary | ICD-10-CM | POA: Insufficient documentation

## 2019-02-08 ENCOUNTER — Other Ambulatory Visit: Payer: Self-pay

## 2019-02-09 ENCOUNTER — Other Ambulatory Visit: Payer: Self-pay

## 2019-02-09 ENCOUNTER — Ambulatory Visit (INDEPENDENT_AMBULATORY_CARE_PROVIDER_SITE_OTHER): Payer: Medicare Other | Admitting: Cardiology

## 2019-02-09 ENCOUNTER — Encounter: Payer: Self-pay | Admitting: Cardiology

## 2019-02-09 VITALS — BP 132/73 | HR 89 | Temp 98.0°F | Ht 70.0 in | Wt 191.4 lb

## 2019-02-09 DIAGNOSIS — Z01810 Encounter for preprocedural cardiovascular examination: Secondary | ICD-10-CM

## 2019-02-09 DIAGNOSIS — I2510 Atherosclerotic heart disease of native coronary artery without angina pectoris: Secondary | ICD-10-CM

## 2019-02-09 DIAGNOSIS — E7800 Pure hypercholesterolemia, unspecified: Secondary | ICD-10-CM | POA: Diagnosis not present

## 2019-02-09 MED ORDER — EZETIMIBE 10 MG PO TABS: 10.0000 mg | ORAL_TABLET | Freq: Every day | ORAL | 2 refills | Status: DC

## 2019-02-09 NOTE — Progress Notes (Signed)
 Primary Physician/Referring:  Perini, Mark, MD  Patient ID: Jeffrey Ewing, male    DOB: 06/20/1940, 78 y.o.   MRN: [id number]  Chief Complaint  Patient presents with  . Coronary Artery Disease  . Medical Clearance    Preop   HPI:    HPI: Jeffrey Ewing  is a 78 y.o. with hyperlipidemia, history of TIA in 2005, referred to us for preoperative cardiac examanation by Dr. Perini after noted to have coronary atherosclerosis on CT scan. He is scheduled for right shoulder arthroscopy on 03/04/2019.  He is asymptomatic. Denies any chest pain, shortness of breath, leg edema. He has previously had a TIA in 2005. Does have PFO by echo in 2005. He is on Aggrenox. No history of hypertension or diabetes.  He is fairly active. He recently has been moving furniture a few weeks ago with going up and down stairs without any exertional difficulties. He is a former smoker that quit in 2005.  Past Medical History:  Diagnosis Date  . 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 History of kidney stones   . History of prostate cancer 2008  . Hyperlipidemia   . 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 Melanoma (HCC) 2001   left cheek  . Osteoarthritis    shoulders, knees  . Stroke (HCC) 2005   "TIA from hole in heart", no deficits   Past Surgical History:  Procedure Laterality Date  . COLONOSCOPY    . COLONOSCOPY W/ POLYPECTOMY  Oct. 2013  . JOINT REPLACEMENT Bilateral   . KNEE ARTHROSCOPY  2009   left  . MASS EXCISION Right 04/03/2016   Procedure: EXCISION cyst right index finger abd debridement proximal interphalangeal;  Surgeon: Kuzma, Gary, MD;  Location: [facility];  Service: Orthopedics;  Laterality: Right;  FAB  . PROSTATECTOMY  2008  . removal melanoma  2001   left cheek  . TONSILLECTOMY  as child  . 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 TOTAL KNEE ARTHROPLASTY Bilateral   . VASECTOMY  1978   Social History   Socioeconomic History  . Marital status: Married    Spouse name: Not on file  . Number of children: 3  . Years of education: Not on file  . Highest education level: Not on file  Occupational History  . Not on file  Social Needs  . Financial resource strain: Not on file  . Food insecurity    Worry: Not on file    Inability: Not on file  . Transportation needs    Medical: Not on file    Non-medical: Not on file  Tobacco Use  . Smoking status: Former Smoker    Packs/day: 1.00    Years: 41.00    Pack years: 41.00    Types: Cigarettes    Quit date: 11/05/2003    Years since quitting: 15.2  . Smokeless tobacco: Never Used  Substance and Sexual Activity  . Alcohol use: No  . Drug use: No  . Sexual activity: Not on file  Lifestyle  . Physical activity    Days per week: Not on file    Minutes per session: Not on file  . Stress: Not on file  Relationships  . Social connections      Talks on phone: Not on file    Gets together: Not on file    Attends religious service: Not on file    Active member of club or organization: Not on file    Attends meetings of clubs or organizations: Not on file    Relationship status: Not on file  . Intimate partner violence    Fear of current or ex partner: Not on file    Emotionally abused: Not on file    Physically abused: Not on file    Forced sexual activity: Not on file  Other Topics Concern  . Not on file  Social History Narrative  . Not on file   ROS  Review of Systems  Constitution: Negative for decreased appetite, malaise/fatigue, weight gain and weight loss.  Eyes: Negative for visual disturbance.  Cardiovascular: Negative for chest pain, claudication, dyspnea on exertion, leg swelling, orthopnea, palpitations and syncope.  Respiratory: Negative for hemoptysis and wheezing.    Endocrine: Negative for cold intolerance and heat intolerance.  Hematologic/Lymphatic: Does not bruise/bleed easily.  Skin: Negative for nail changes.  Musculoskeletal: Negative for muscle weakness and myalgias.  Gastrointestinal: Negative for abdominal pain, change in bowel habit, nausea and vomiting.  Neurological: Negative for difficulty with concentration, dizziness, focal weakness and headaches.  Psychiatric/Behavioral: Negative for altered mental status and suicidal ideas.  All other systems reviewed and are negative.  Objective  Blood pressure 132/73, pulse 89, temperature 98 F (36.7 C), height 5' 10" (1.778 m), weight 191 lb 6.4 oz (86.8 kg), SpO2 97 %. Body mass index is 27.46 kg/m.   Physical Exam  Constitutional: He is oriented to person, place, and time. Vital signs are normal. He appears well-developed and well-nourished.  HENT:  Head: Normocephalic and atraumatic.  Neck: Normal range of motion.  Cardiovascular: Normal rate, regular rhythm, normal heart sounds and intact distal pulses.  Pulmonary/Chest: Effort normal and breath sounds normal. No accessory muscle usage. No respiratory distress.  Abdominal: Soft. Bowel sounds are normal.  Musculoskeletal: Normal range of motion.  Neurological: He is alert and oriented to person, place, and time.  Skin: Skin is warm and dry.  Vitals reviewed.   Radiology: CT of chest 04/13/2018:  1. Lung-RADS 1, negative. Continue annual screening with low-dose chest CT without contrast in 12 months. 2. Three-vessel coronary atherosclerosis.  Aortic Atherosclerosis (ICD10-I70.0).  Laboratory examination:   06/07/2018: Creatinine 0.9, potassium 4.3, eGFR 81/99 CMP normal. Cholesterol 169, triglycerides 72, HDL 67, LDL 88  No results for input(s): NA, K, CL, CO2, GLUCOSE, BUN, CREATININE, CALCIUM, GFRNONAA, GFRAA in the last 8760 hours. CMP Latest Ref Rng & Units 12/22/2012 12/21/2012 12/14/2012  Glucose 70 - 99 mg/dL 157(H) 191(H)  102(H)  BUN 6 - 23 mg/dL 13 13 20  Creatinine 0.50 - 1.35 mg/dL 0.88 0.79 0.97  Sodium 135 - 145 mEq/L 134(L) 137 139  Potassium 3.5 - 5.1 mEq/L 4.8 4.2 4.5  Chloride 96 - 112 mEq/L 101 103 103  CO2 19 - 32 mEq/L 28 28 27  Calcium 8.4 - 10.5 mg/dL 9.0 8.7 9.6  Total Protein 6.0 - 8.3 g/dL - - 6.8  Total Bilirubin 0.3 - 1.2 mg/dL - - 0.6  Alkaline Phos 39 - 117 U/L - - 85  AST 0 - 37 U/L - - 19  ALT 0 - 53 U/L - - 17   CBC Latest Ref Rng & Units 12/22/2012 12/21/2012 12/14/2012  WBC 4.0 - 10.5 K/uL 20.7(H) 18.0(H) 11.9(H)  Hemoglobin 13.0 - 17.0 g/dL   10.4(L) 11.7(L) 14.3  Hematocrit 39.0 - 52.0 % 30.9(L) 34.9(L) 42.6  Platelets 150 - 400 K/uL 112(L) 123(L) 159   Lipid Panel  No results found for: CHOL, TRIG, HDL, CHOLHDL, VLDL, LDLCALC, LDLDIRECT HEMOGLOBIN A1C No results found for: HGBA1C, MPG TSH No results for input(s): TSH in the last 8760 hours. Medications   Current Outpatient Medications  Medication Instructions  . Crestor 20 mg,  Every morning - 10a  . dipyridamole-aspirin (AGGRENOX) 200-25 MG per 12 hr capsule 1 capsule, 2 times daily  . ezetimibe (ZETIA) 10 mg, Oral, Daily  . ketoconazole (NIZORAL) 2 % cream As needed  . naproxen sodium (ALEVE) 220 mg, Oral    Cardiac Studies:     Assessment     ICD-10-CM   1. Coronary artery disease involving native coronary artery without angina pectoris, unspecified whether native or transplanted heart  I25.10   2. Preoperative cardiovascular examination  Z01.810   3. Pure hypercholesterolemia  E78.00     PCP EKG 01/21/2019: Normal sinus rhythm at 83 bpm, left atrial enlargement, normal axis, no evidence of ischemia.  Recommendations:   Patient referred to me for evaluation of and preoperative cardiac risk stratification for upcoming right shoulder surgery.  Patient although 78 years of age is very active.  Just 2 weeks ago he cut [place] almost a whole truckload and was able to call the mouth  without any chest pain or shortness of breath.  He also moved significant amount of furniture just about 3 weeks ago when he sold his duplex.  His exercise capacity is at least 7.0 mets if not more.  He has never had any chest pain or dyspnea.  No clinical evidence of vascular disease, carotids and also lower extremity pulses are all intact.  EKG is normal.  Hence in view of this, he can undergo right shoulder surgery with acceptable cardiovascular risk.  In view of triple-vessel coronary artery disease, I have recommended addition of Zetia 10 mg after dinner to improve his LDL goal to target less than 70 mg percent.  I'll be happy to be on standby if he has any cardiac issues during surgery are very procedurally.   *I have discussed this case with Dr. Einar Gip and he personally examined the patient and participated in formulating the plan.*   Miquel Dunn, MSN, APRN, FNP-C [facility]. [place] Office: [phone] Fax: [phone]

## 2019-02-14 ENCOUNTER — Ambulatory Visit: Payer: Self-pay | Admitting: Cardiology

## 2019-02-22 NOTE — H&P (Signed)
Patient's anticipated LOS is less than 2 midnights, meeting these requirements: - Younger than 63 - Lives within 1 hour of care - Has a competent adult at home to recover with post-op recover - NO history of  - Chronic pain requiring opiods  - Diabetes  - Coronary Artery Disease  - Heart failure  - Heart attack  - Stroke  - DVT/VTE  - Cardiac arrhythmia  - Respiratory Failure/COPD  - Renal failure  - Anemia  - Advanced Liver disease       Jeffrey Ewing is an 78 y.o. male.    Chief Complaint: right shoulder pain  HPI: Pt is a 78 y.o. male complaining of right shoulder pain for multiple years. Pain had continually increased since the beginning. X-rays in the clinic show end-stage arthritic changes of the right shoulder. Pt has tried various conservative treatments which have failed to alleviate their symptoms, including injections and therapy. Various options are discussed with the patient. Risks, benefits and expectations were discussed with the patient. Patient understand the risks, benefits and expectations and wishes to proceed with surgery.   PCP:  Crist Infante, MD  D/C Plans: Home  PMH: Past Medical History:  Diagnosis Date  . 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 History of kidney stones   . History of prostate cancer 2008  . Hyperlipidemia   . 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 Melanoma ([place]) 2001   left cheek  . Osteoarthritis    shoulders, knees  . 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 COLONOSCOPY    . COLONOSCOPY W/ POLYPECTOMY  Oct. 2013  . JOINT REPLACEMENT Bilateral   . KNEE ARTHROSCOPY  2009   left  . MASS EXCISION Right 04/03/2016   Procedure: EXCISION cyst right index finger abd debridement proximal interphalangeal;  Surgeon: Daryll Brod, MD;  Location: [facility];   Service: Orthopedics;  Laterality: Right;  FAB  . PROSTATECTOMY  2008  . removal melanoma  2001   left cheek  . TONSILLECTOMY  as child  . TOTAL KNEE ARTHROPLASTY Right 12/20/2012   Procedure: RIGHT TOTAL KNEE ARTHROPLASTY;  Surgeon: Gearlean Alf, MD;  Location: [facility];  Service: Orthopedics;  Laterality: Right;  . TOTAL KNEE ARTHROPLASTY Bilateral   . VASECTOMY  1978    Social History:  reports that he quit smoking about 15 years ago. His smoking use included cigarettes. He has a 41.00 pack-year smoking history. He has never used smokeless tobacco. He reports that he does not drink alcohol or use drugs.  Allergies:  No Known Allergies  Medications: No current facility-administered medications for this encounter.    Current Outpatient Medications  Medication Sig Dispense Refill  . CRESTOR 20 MG tablet Take 20 mg by mouth every morning.     . dipyridamole-aspirin (AGGRENOX) 200-25 MG per 12 hr capsule Take 1 capsule by mouth 2 (two) times daily.    Marland Kitchen ezetimibe (ZETIA) 10 MG tablet Take 1 tablet (10 mg total) by mouth daily. 30 tablet 2  . ketoconazole (NIZORAL) 2 % cream as needed.    . naproxen sodium (ALEVE) 220 MG tablet Take 220 mg by mouth.      No results found for this or any previous visit (from the past 48 hour(s)). No  results found.  ROS: Pain with rom of the right upper extremity  Physical Exam: Alert and oriented 78 y.o. male in no acute distress Cranial nerves 2-12 intact Cervical spine: full rom with no tenderness, nv intact distally Chest: active breath sounds bilaterally, no wheeze rhonchi or rales Heart: regular rate and rhythm, no murmur Abd: non tender non distended with active bowel sounds Hip is stable with rom  Right shoulder with painful rom nv intact distally Moderate weakness with ER and IR  Assessment/Plan Assessment: right shoulder cuff arthropathy  Plan:  Patient will undergo a right reverse total shoulder by Dr. Veverly Fells at [facility].  Risks benefits and expectations were discussed with the patient. Patient understand risks, benefits and expectations and wishes to proceed. Preoperative templating of the joint replacement has been completed, documented, and submitted to the Operating Room personnel in order to optimize intra-operative equipment management.   Merla Riches PA-C, MPAS [facility] is now [employer] [address], [place], [place],  [address] Phone: [phone] [email or web address] Facebook  [employer]

## 2019-02-28 NOTE — Progress Notes (Addendum)
PCP - Crist Infante  Cardiologist - Dr. Einar Gip  Cardiac Clearance from Jeri Lager, on 02-09-19  Chest x-ray - CT 04/13/18   EKG - 01-21-19  Stress Test -  ECHO -  Cardiac Cath -   Sleep Study -  CPAP -   Fasting Blood Sugar -  Checks Blood Sugar _____ times a day  Blood Thinner Instructions: Aggrenox  Aspirin Instructions: Last Dose: 02/28/19   Anesthesia review:   Patient denies shortness of breath, fever, cough and chest pain at PAT appointment   Patient verbalized understanding of instructions that were given to them at the PAT appointment. Patient was also instructed that they will need to review over the PAT instructions again at home before surgery.

## 2019-02-28 NOTE — Patient Instructions (Addendum)
DUE TO COVID-19 ONLY ONE VISITOR IS ALLOWED TO COME WITH YOU AND STAY IN THE WAITING ROOM ONLY DURING PRE OP AND PROCEDURE DAY OF SURGERY. THE 1 VISITOR MAY VISIT WITH YOU AFTER SURGERY IN YOUR PRIVATE ROOM DURING VISITING HOURS ONLY!  YOU NEED TO HAVE A COVID 19 TEST ON 03/01/2019.  PLEASE BEGIN THE QUARANTINE INSTRUCTIONS AS OUTLINED IN YOUR HANDOUT.                Your procedure is scheduled on: 03/04/2019   Report to [facility] Main  Entrance    Report to admitting at 7:30 AM     Call this number if you have problems the morning of surgery [phone]    NO SOLID FOOD AFTER MIDNIGHT, THE NIGHT PRIOR TO SURGERY. NOTHING BY MOUTH EXCEPT CLEAR LIQUIDS UNTIL 7:05 AM . PLEASE FINISH ENSURE DRINK PER SURGEON ORDER  WHICH NEEDS TO BE COMPLETED AT 7:05 AM.   CLEAR LIQUID DIET   Foods Allowed                                                                     Foods Excluded  Coffee and tea, regular and decaf                             liquids that you cannot  Plain Jell-O any favor except red or purple                                           see through such as: Fruit ices (not with fruit pulp)                                     milk, soups, orange juice  Iced Popsicles                                    All solid food Carbonated beverages, regular and diet                                    Cranberry, grape and apple juices Sports drinks like Gatorade Lightly seasoned clear broth or consume(fat free) Sugar, honey syrup  Sample Menu Breakfast                                Lunch                                     Supper Cranberry juice                    Beef broth  Chicken broth Jell-O                                     Grape juice                           Apple juice Coffee or tea                        Jell-O                                      Popsicle                                                Coffee or tea                         Coffee or tea  _____________________________________________________________________     Take these medicines the morning of surgery with A SIP OF WATER: None    BRUSH YOUR TEETH MORNING OF SURGERY AND RINSE YOUR MOUTH OUT, NO CHEWING GUM CANDY OR MINTS.                               You may not have any metal on your body including hair pins and              piercings     Do not wear jewelry, cologne, lotions, powders or deodorant                    Men may shave face and neck.   Do not bring valuables to the hospital. [name].  Contacts, dentures or bridgework may not be worn into surgery.  Leave suitcase in the car. After surgery it may be brought to your room.       [name] - Preparing for Surgery Before surgery, you can play an important role.  Because skin is not sterile, your skin needs to be as free of germs as possible.  You can reduce the number of germs on your skin by washing with CHG (chlorahexidine gluconate) soap before surgery.  CHG is an antiseptic cleaner which kills germs and bonds with the skin to continue killing germs even after washing. Please DO NOT use if you have an allergy to CHG or antibacterial soaps.  If your skin becomes reddened/irritated stop using the CHG and inform your nurse when you arrive at Short Stay. Do not shave (including legs and underarms) for at least 48 hours prior to the first CHG shower.  You may shave your face/neck. Please follow these instructions carefully:  1.  Shower with CHG Soap the night before surgery and the  morning of Surgery.  2.  If you choose to wash your hair, wash your hair first as usual with your  normal  shampoo.  3.  After you shampoo, rinse your hair and body thoroughly to remove the  shampoo.  4.  Use CHG as you would any other liquid soap.  You can apply chg directly  to the skin and wash                       Gently with a  scrungie or clean washcloth.  5.  Apply the CHG Soap to your body ONLY FROM THE NECK DOWN.   Do not use on face/ open                           Wound or open sores. Avoid contact with eyes, ears mouth and genitals (private parts).                       Wash face,  Genitals (private parts) with your normal soap.             6.  Wash thoroughly, paying special attention to the area where your surgery  will be performed.  7.  Thoroughly rinse your body with warm water from the neck down.  8.  DO NOT shower/wash with your normal soap after using and rinsing off  the CHG Soap.                9.  Pat yourself dry with a clean towel.            10.  Wear clean pajamas.            11.  Place clean sheets on your bed the night of your first shower and do not  sleep with pets. Day of Surgery : Do not apply any lotions/deodorants the morning of surgery.  Please wear clean clothes to the hospital/surgery center.  FAILURE TO FOLLOW THESE INSTRUCTIONS MAY RESULT IN THE CANCELLATION OF YOUR SURGERY PATIENT SIGNATURE_________________________________  NURSE SIGNATURE__________________________________  ________________________________________________________________________  ___________________    Jeffrey Ewing  An incentive spirometer is a tool that can help keep your lungs clear and active. This tool measures how well you are filling your lungs with each breath. Taking long deep breaths may help reverse or decrease the chance of developing breathing (pulmonary) problems (especially infection) following:  A long period of time when you are unable to move or be active. BEFORE THE PROCEDURE   If the spirometer includes an indicator to show your best effort, your nurse or respiratory therapist will set it to a desired goal.  If possible, sit up straight or lean slightly forward. Try not to slouch.  Hold the incentive spirometer in an upright position. INSTRUCTIONS FOR USE  1. Sit on the edge  of your bed if possible, or sit up as far as you can in bed or on a chair. 2. Hold the incentive spirometer in an upright position. 3. Breathe out normally. 4. Place the mouthpiece in your mouth and seal your lips tightly around it. 5. Breathe in slowly and as deeply as possible, raising the piston or the ball toward the top of the column. 6. Hold your breath for 3-5 seconds or for as long as possible. Allow the piston or ball to fall to the bottom of the column. 7. Remove the mouthpiece from your mouth and breathe out normally. 8. Rest for a few seconds and repeat Steps 1 through 7 at least 10 times every 1-2 hours when you are awake. Take your time and take a few normal breaths between deep breaths. 9. The spirometer may include  an indicator to show your best effort. Use the indicator as a goal to work toward during each repetition. 10. After each set of 10 deep breaths, practice coughing to be sure your lungs are clear. If you have an incision (the cut made at the time of surgery), support your incision when coughing by placing a pillow or rolled up towels firmly against it. Once you are able to get out of bed, walk around indoors and cough well. You may stop using the incentive spirometer when instructed by your caregiver.  RISKS AND COMPLICATIONS  Take your time so you do not get dizzy or light-headed.  If you are in pain, you may need to take or ask for pain medication before doing incentive spirometry. It is harder to take a deep breath if you are having pain. AFTER USE  Rest and breathe slowly and easily.  It can be helpful to keep track of a log of your progress. Your caregiver can provide you with a simple table to help with this. If you are using the spirometer at home, follow these instructions: [place] IF:   You are having difficultly using the spirometer.  You have trouble using the spirometer as often as instructed.  Your pain medication is not giving enough  relief while using the spirometer.  You develop fever of 100.5 F (38.1 C) or higher. SEEK IMMEDIATE MEDICAL CARE IF:   You cough up bloody sputum that had not been present before.  You develop fever of 102 F (38.9 C) or greater.  You develop worsening pain at or near the incision site. MAKE SURE YOU:   Understand these instructions.  Will watch your condition.  Will get help right away if you are not doing well or get worse. Document Released: 07/28/2006 Document Revised: 06/09/2011 Document Reviewed: 09/28/2006 [facility] Patient Information 2014 [place], [place].   ________________________________________________________________________

## 2019-03-01 ENCOUNTER — Other Ambulatory Visit (HOSPITAL_COMMUNITY)
Admission: RE | Admit: 2019-03-01 | Discharge: 2019-03-01 | Disposition: A | Discharge status: Home / Self Care | Payer: Medicare Other | Source: Ambulatory Visit | Attending: Orthopedic Surgery | Admitting: Orthopedic Surgery

## 2019-03-02 ENCOUNTER — Encounter (HOSPITAL_COMMUNITY): Payer: Self-pay

## 2019-03-02 ENCOUNTER — Encounter (HOSPITAL_COMMUNITY)
Admission: RE | Admit: 2019-03-02 | Discharge: 2019-03-02 | Disposition: A | Discharge status: Home / Self Care | Payer: Medicare Other | Source: Ambulatory Visit | Attending: Orthopedic Surgery | Admitting: Orthopedic Surgery

## 2019-03-02 ENCOUNTER — Other Ambulatory Visit: Payer: Self-pay

## 2019-03-02 LAB — BASIC METABOLIC PANEL
Anion gap: 9 (ref 5–15)
BUN: 12 mg/dL (ref 8–23)
CO2: 27 mmol/L (ref 22–32)
Calcium: 9.5 mg/dL (ref 8.9–10.3)
Chloride: 103 mmol/L (ref 98–111)
Creatinine, Ser: 0.76 mg/dL (ref 0.61–1.24)
GFR calc Af Amer: >60 mL/min (ref >60)
GFR calc non Af Amer: >60 mL/min (ref >60)
Glucose, Bld: 101 mg/dL — ABNORMAL HIGH (ref 70–99)
Potassium: 4.1 mmol/L (ref 3.5–5.1)
Sodium: 139 mmol/L (ref 135–145)

## 2019-03-02 LAB — CBC
HCT: 43.4 % (ref 39.0–52.0)
Hemoglobin: 14.4 g/dL (ref 13.0–17.0)
MCH: 35.7 pg — ABNORMAL HIGH (ref 26.0–34.0)
MCHC: 33.2 g/dL (ref 30.0–36.0)
MCV: 107.7 fL — ABNORMAL HIGH (ref 80.0–100.0)
Platelets: 116 10*3/uL — ABNORMAL LOW (ref 150–400)
RBC: 4.03 MIL/uL — ABNORMAL LOW (ref 4.22–5.81)
RDW: 13 % (ref 11.5–15.5)
WBC: 5.8 10*3/uL (ref 4.0–10.5)
nRBC: 0 % (ref 0.0–0.2)

## 2019-03-02 LAB — SURGICAL PCR SCREEN
MRSA, PCR: NEGATIVE
Staphylococcus aureus: NEGATIVE

## 2019-03-03 LAB — NOVEL CORONAVIRUS, NAA (HOSP ORDER, SEND-OUT TO REF LAB; TAT 18-24 HRS): SARS-CoV-2, NAA: NOT DETECTED

## 2019-03-03 NOTE — Anesthesia Preprocedure Evaluation (Addendum)
Anesthesia Evaluation  Patient identified by MRN, date of birth, ID band Patient awake    Reviewed: Allergy & Precautions, NPO status , Patient's Chart, lab work & pertinent test results  Airway Mallampati: II  TM Distance: >3 FB Neck ROM: Full    Dental   Pulmonary former smoker,    Pulmonary exam normal        Cardiovascular + CAD  Normal cardiovascular exam     Neuro/Psych CVA    GI/Hepatic   Endo/Other    Renal/GU      Musculoskeletal   Abdominal   Peds  Hematology   Anesthesia Other Findings   Reproductive/Obstetrics                            Anesthesia Physical Anesthesia Plan  ASA: II  Anesthesia Plan: General   Post-op Pain Management:  Regional for Post-op pain   Induction:   PONV Risk Score and Plan: 2 and Ondansetron and Treatment may vary due to age or medical condition  Airway Management Planned: Oral ETT  Additional Equipment:   Intra-op Plan:   Post-operative Plan: Extubation in OR  Informed Consent: I have reviewed the patients History and Physical, chart, labs and discussed the procedure including the risks, benefits and alternatives for the proposed anesthesia with the patient or authorized representative who has indicated his/her understanding and acceptance.       Plan Discussed with:   Anesthesia Plan Comments: (Cleared by cardiology.  Per note, "Patient referred to me for evaluation of and preoperative cardiac risk stratification for upcoming right shoulder surgery. Patient although 78 years of age is very active.  Just 2 weeks ago he [place] almost a whole truckload and was able to call the mouth without any chest pain or shortness of breath. He also moved significant amount of furniture just about 3 weeks ago when he sold his duplex. His exercise capacity is at least 7.0 mets if not more.  He has never had any chest pain  or dyspnea.  No clinical evidence of vascular disease, carotids and also lower extremity pulses are all intact.  EKG is normal.  Hence in view of this, he can undergo right shoulder surgery with acceptable cardiovascular risk. In view of triple-vessel coronary artery disease, I have recommended addition of Zetia 10 mg after dinner to improve his LDL goal to target less than 70 mg percent. I'll be happy to be on standby if he has any cardiac issues during surgery are very procedurally")       Anesthesia Quick Evaluation

## 2019-03-04 ENCOUNTER — Encounter (HOSPITAL_COMMUNITY): Payer: Self-pay

## 2019-03-04 ENCOUNTER — Inpatient Hospital Stay (HOSPITAL_COMMUNITY): Payer: Medicare Other

## 2019-03-04 ENCOUNTER — Inpatient Hospital Stay (HOSPITAL_COMMUNITY): Payer: Medicare Other | Admitting: Physician Assistant

## 2019-03-04 ENCOUNTER — Inpatient Hospital Stay (HOSPITAL_COMMUNITY): Payer: Medicare Other | Admitting: Anesthesiology

## 2019-03-04 ENCOUNTER — Encounter (HOSPITAL_COMMUNITY)
Admission: RE | Disposition: A | Discharge status: Home / Self Care | Payer: Self-pay | Source: Home / Self Care | Attending: Orthopedic Surgery

## 2019-03-04 ENCOUNTER — Inpatient Hospital Stay (HOSPITAL_COMMUNITY)
Admission: RE | Admit: 2019-03-04 | Discharge: 2019-03-05 | DRG: 483 | Disposition: A | Discharge status: Home / Self Care | Payer: Medicare Other | Attending: Orthopedic Surgery | Admitting: Orthopedic Surgery

## 2019-03-04 ENCOUNTER — Other Ambulatory Visit: Payer: Self-pay

## 2019-03-04 DIAGNOSIS — Z87891 Personal history of nicotine dependence: Secondary | ICD-10-CM

## 2019-03-04 DIAGNOSIS — M19011 Primary osteoarthritis, right shoulder: Principal | ICD-10-CM | POA: Diagnosis present

## 2019-03-04 DIAGNOSIS — Z79899 Other long term (current) drug therapy: Secondary | ICD-10-CM | POA: Diagnosis not present

## 2019-03-04 DIAGNOSIS — Z20828 Contact with and (suspected) exposure to other viral communicable diseases: Secondary | ICD-10-CM | POA: Diagnosis not present

## 2019-03-04 DIAGNOSIS — Z87442 Personal history of urinary calculi: Secondary | ICD-10-CM | POA: Diagnosis not present

## 2019-03-04 DIAGNOSIS — E785 Hyperlipidemia, unspecified: Secondary | ICD-10-CM | POA: Diagnosis not present

## 2019-03-04 DIAGNOSIS — Z791 Long term (current) use of non-steroidal anti-inflammatories (NSAID): Secondary | ICD-10-CM

## 2019-03-04 DIAGNOSIS — Z96611 Presence of right artificial shoulder joint: Secondary | ICD-10-CM

## 2019-03-04 DIAGNOSIS — Z8546 Personal history of malignant neoplasm of prostate: Secondary | ICD-10-CM | POA: Diagnosis not present

## 2019-03-04 DIAGNOSIS — Z7982 Long term (current) use of aspirin: Secondary | ICD-10-CM

## 2019-03-04 DIAGNOSIS — Z85820 Personal history of malignant melanoma of skin: Secondary | ICD-10-CM

## 2019-03-04 DIAGNOSIS — Z8673 Personal history of transient ischemic attack (TIA), and cerebral infarction without residual deficits: Secondary | ICD-10-CM | POA: Diagnosis not present

## 2019-03-04 HISTORY — PX: REVERSE SHOULDER ARTHROPLASTY: SHX5054

## 2019-03-04 SURGERY — ARTHROPLASTY, SHOULDER, TOTAL, REVERSE
Anesthesia: General | Site: Shoulder | Laterality: Right

## 2019-03-04 MED ORDER — PHENYLEPHRINE HCL-NACL 10-0.9 MG/250ML-% IV SOLN
INTRAVENOUS | Status: DC | PRN
  Administered 2019-03-04: 30 ug/min via INTRAVENOUS

## 2019-03-04 MED ORDER — MIDAZOLAM HCL 2 MG/2ML IJ SOLN
1.0000 mg | INTRAMUSCULAR | Status: DC
  Administered 2019-03-04: 2 mg via INTRAVENOUS
  Filled 2019-03-04: qty 2

## 2019-03-04 MED ORDER — FENTANYL CITRATE (PF) 100 MCG/2ML IJ SOLN
50.0000 ug | INTRAMUSCULAR | Status: DC
  Administered 2019-03-04: 50 ug via INTRAVENOUS
  Filled 2019-03-04: qty 2

## 2019-03-04 MED ORDER — ROCURONIUM BROMIDE 10 MG/ML (PF) SYRINGE
PREFILLED_SYRINGE | INTRAVENOUS | Status: DC | PRN
  Administered 2019-03-04: 90 mg via INTRAVENOUS

## 2019-03-04 MED ORDER — LIDOCAINE 2% (20 MG/ML) 5 ML SYRINGE
INTRAMUSCULAR | Status: DC | PRN
  Administered 2019-03-04 (×2): 50 mg via INTRAVENOUS

## 2019-03-04 MED ORDER — SODIUM CHLORIDE 0.9 % IV SOLN
INTRAVENOUS | Status: DC
  Administered 2019-03-04: 16:00:00 via INTRAVENOUS

## 2019-03-04 MED ORDER — BUPIVACAINE HCL (PF) 0.25 % IJ SOLN
INTRAMUSCULAR | Status: AC
  Filled 2019-03-04: qty 30

## 2019-03-04 MED ORDER — FENTANYL CITRATE (PF) 100 MCG/2ML IJ SOLN
INTRAMUSCULAR | Status: AC
  Filled 2019-03-04: qty 2

## 2019-03-04 MED ORDER — DOCUSATE SODIUM 100 MG PO CAPS
100.0000 mg | ORAL_CAPSULE | Freq: Two times a day (BID) | ORAL | Status: DC
  Administered 2019-03-04 – 2019-03-05 (×2): 100 mg via ORAL
  Filled 2019-03-04 (×2): qty 1

## 2019-03-04 MED ORDER — LACTATED RINGERS IV SOLN
INTRAVENOUS | Status: DC
  Administered 2019-03-04: 10:00:00 via INTRAVENOUS
  Administered 2019-03-04: 1000 mL via INTRAVENOUS
  Administered 2019-03-04: 08:00:00 via INTRAVENOUS

## 2019-03-04 MED ORDER — BUPIVACAINE-EPINEPHRINE 0.25% -1:200000 IJ SOLN
INTRAMUSCULAR | Status: AC
  Filled 2019-03-04: qty 1

## 2019-03-04 MED ORDER — ONDANSETRON HCL 4 MG PO TABS: 4.0000 mg | ORAL_TABLET | Freq: Four times a day (QID) | ORAL | Status: DC | PRN

## 2019-03-04 MED ORDER — CEFAZOLIN SODIUM-DEXTROSE 2-4 GM/100ML-% IV SOLN
2.0000 g | Freq: Four times a day (QID) | INTRAVENOUS | Status: AC
  Administered 2019-03-04 – 2019-03-05 (×3): 2 g via INTRAVENOUS
  Filled 2019-03-04 (×3): qty 100

## 2019-03-04 MED ORDER — HYDROMORPHONE HCL 1 MG/ML IJ SOLN: 0.2500 mg | INTRAMUSCULAR | Status: DC | PRN

## 2019-03-04 MED ORDER — PROPOFOL 10 MG/ML IV BOLUS
INTRAVENOUS | Status: AC
  Filled 2019-03-04: qty 20

## 2019-03-04 MED ORDER — EPHEDRINE SULFATE-NACL 50-0.9 MG/10ML-% IV SOSY
PREFILLED_SYRINGE | INTRAVENOUS | Status: DC | PRN
  Administered 2019-03-04 (×2): 5 mg via INTRAVENOUS
  Administered 2019-03-04 (×2): 10 mg via INTRAVENOUS

## 2019-03-04 MED ORDER — ONDANSETRON HCL 4 MG PO TABS: 4.0000 mg | ORAL_TABLET | Freq: Three times a day (TID) | ORAL | 0 refills | Status: AC | PRN

## 2019-03-04 MED ORDER — MECLIZINE HCL 25 MG PO TABS: 25.0000 mg | ORAL_TABLET | Freq: Three times a day (TID) | ORAL | Status: DC | PRN

## 2019-03-04 MED ORDER — ROSUVASTATIN CALCIUM 20 MG PO TABS
20.0000 mg | ORAL_TABLET | Freq: Every day | ORAL | Status: DC
  Administered 2019-03-04 – 2019-03-05 (×2): 20 mg via ORAL
  Filled 2019-03-04 (×2): qty 1

## 2019-03-04 MED ORDER — MENTHOL 3 MG MT LOZG: 1.0000 | LOZENGE | OROMUCOSAL | Status: DC | PRN

## 2019-03-04 MED ORDER — MEPERIDINE HCL 50 MG/ML IJ SOLN: 6.2500 mg | INTRAMUSCULAR | Status: DC | PRN

## 2019-03-04 MED ORDER — BUPIVACAINE-EPINEPHRINE (PF) 0.25% -1:200000 IJ SOLN
INTRAMUSCULAR | Status: DC | PRN
  Administered 2019-03-04: 10 mL

## 2019-03-04 MED ORDER — OXYCODONE-ACETAMINOPHEN 5-325 MG PO TABS: 1.0000 | ORAL_TABLET | ORAL | 0 refills | Status: AC | PRN

## 2019-03-04 MED ORDER — PROPOFOL 10 MG/ML IV BOLUS
INTRAVENOUS | Status: DC | PRN
  Administered 2019-03-04: 150 mg via INTRAVENOUS

## 2019-03-04 MED ORDER — ACETAMINOPHEN 325 MG PO TABS: 325.0000 mg | ORAL_TABLET | Freq: Four times a day (QID) | ORAL | Status: DC | PRN

## 2019-03-04 MED ORDER — ALBUMIN HUMAN 5 % IV SOLN
INTRAVENOUS | Status: DC | PRN
  Administered 2019-03-04 (×2): via INTRAVENOUS

## 2019-03-04 MED ORDER — BUPIVACAINE LIPOSOME 1.3 % IJ SUSP
INTRAMUSCULAR | Status: DC | PRN
  Administered 2019-03-04: 10 mL via PERINEURAL

## 2019-03-04 MED ORDER — ONDANSETRON HCL 4 MG/2ML IJ SOLN: 4.0000 mg | Freq: Once | INTRAMUSCULAR | Status: DC | PRN

## 2019-03-04 MED ORDER — CHLORHEXIDINE GLUCONATE 4 % EX LIQD: 60.0000 mL | Freq: Once | CUTANEOUS | Status: DC

## 2019-03-04 MED ORDER — FENTANYL CITRATE (PF) 100 MCG/2ML IJ SOLN
INTRAMUSCULAR | Status: DC | PRN
  Administered 2019-03-04 (×2): 100 ug via INTRAVENOUS

## 2019-03-04 MED ORDER — PHENOL 1.4 % MT LIQD: 1.0000 | OROMUCOSAL | Status: DC | PRN

## 2019-03-04 MED ORDER — POLYETHYLENE GLYCOL 3350 17 G PO PACK: 17.0000 g | PACK | Freq: Every day | ORAL | Status: DC | PRN

## 2019-03-04 MED ORDER — OXYCODONE HCL 5 MG PO TABS
5.0000 mg | ORAL_TABLET | ORAL | Status: DC | PRN
  Administered 2019-03-05: 5 mg via ORAL
  Filled 2019-03-04: qty 1

## 2019-03-04 MED ORDER — ONDANSETRON HCL 4 MG/2ML IJ SOLN
INTRAMUSCULAR | Status: AC
  Filled 2019-03-04: qty 2

## 2019-03-04 MED ORDER — SODIUM CHLORIDE 0.9 % IR SOLN
Status: DC | PRN
  Administered 2019-03-04: 1000 mL

## 2019-03-04 MED ORDER — EZETIMIBE 10 MG PO TABS
10.0000 mg | ORAL_TABLET | Freq: Every day | ORAL | Status: DC
  Administered 2019-03-04 – 2019-03-05 (×2): 10 mg via ORAL
  Filled 2019-03-04 (×2): qty 1

## 2019-03-04 MED ORDER — ALBUMIN HUMAN 5 % IV SOLN
INTRAVENOUS | Status: AC
  Filled 2019-03-04: qty 250

## 2019-03-04 MED ORDER — BUPIVACAINE-EPINEPHRINE (PF) 0.5% -1:200000 IJ SOLN
INTRAMUSCULAR | Status: DC | PRN
  Administered 2019-03-04: 20 mL via PERINEURAL

## 2019-03-04 MED ORDER — CEFAZOLIN SODIUM-DEXTROSE 2-4 GM/100ML-% IV SOLN
2.0000 g | INTRAVENOUS | Status: AC
  Administered 2019-03-04: 2 g via INTRAVENOUS
  Filled 2019-03-04: qty 100

## 2019-03-04 MED ORDER — BISACODYL 10 MG RE SUPP: 10.0000 mg | Freq: Every day | RECTAL | Status: DC | PRN

## 2019-03-04 MED ORDER — SUGAMMADEX SODIUM 500 MG/5ML IV SOLN
INTRAVENOUS | Status: DC | PRN
  Administered 2019-03-04: 250 mg via INTRAVENOUS

## 2019-03-04 MED ORDER — ONDANSETRON HCL 4 MG/2ML IJ SOLN
INTRAMUSCULAR | Status: DC | PRN
  Administered 2019-03-04: 4 mg via INTRAVENOUS

## 2019-03-04 MED ORDER — EPHEDRINE 5 MG/ML INJ
INTRAVENOUS | Status: AC
  Filled 2019-03-04: qty 10

## 2019-03-04 MED ORDER — METOCLOPRAMIDE HCL 5 MG/ML IJ SOLN: 5.0000 mg | Freq: Three times a day (TID) | INTRAMUSCULAR | Status: DC | PRN

## 2019-03-04 MED ORDER — ONDANSETRON HCL 4 MG/2ML IJ SOLN: 4.0000 mg | Freq: Four times a day (QID) | INTRAMUSCULAR | Status: DC | PRN

## 2019-03-04 MED ORDER — METOCLOPRAMIDE HCL 5 MG PO TABS: 5.0000 mg | ORAL_TABLET | Freq: Three times a day (TID) | ORAL | Status: DC | PRN

## 2019-03-04 MED ORDER — HYDROMORPHONE HCL 1 MG/ML IJ SOLN: 0.5000 mg | INTRAMUSCULAR | Status: DC | PRN

## 2019-03-04 SURGICAL SUPPLY — 76 items
AID PSTN UNV HD RSTRNT DISP (MISCELLANEOUS) ×1
BAG SPEC THK2 15X12 ZIP CLS (MISCELLANEOUS) ×1
BAG ZIPLOCK 12X15 (MISCELLANEOUS) ×2 IMPLANT
BIT DRILL 1.6MX128 (BIT) ×1 IMPLANT
BIT DRILL 1.6MX128MM (BIT) ×1
BIT DRILL 170X2.5X (BIT) IMPLANT
BIT DRL 170X2.5X (BIT) ×1
BLADE SAG 18X100X1.27 (BLADE) ×3 IMPLANT
CLOSURE WOUND 1/2 X4 (GAUZE/BANDAGES/DRESSINGS) ×1
COVER BACK TABLE 60X90IN (DRAPES) ×3 IMPLANT
COVER SURGICAL LIGHT HANDLE (MISCELLANEOUS) ×3 IMPLANT
COVER WAND RF STERILE (DRAPES) IMPLANT
DECANTER SPIKE VIAL GLASS SM (MISCELLANEOUS) ×3 IMPLANT
DRAPE INCISE IOBAN 66X45 STRL (DRAPES) ×3 IMPLANT
DRAPE ORTHO SPLIT 77X108 STRL (DRAPES) ×6
DRAPE SHEET LG 3/4 BI-LAMINATE (DRAPES) ×3 IMPLANT
DRAPE SURG ORHT 6 SPLT 77X108 (DRAPES) ×2 IMPLANT
DRAPE U-SHAPE 47X51 STRL (DRAPES) ×3 IMPLANT
DRILL 2.5 (BIT) ×3
DRSG ADAPTIC 3X8 NADH LF (GAUZE/BANDAGES/DRESSINGS) ×3 IMPLANT
DRSG PAD ABDOMINAL 8X10 ST (GAUZE/BANDAGES/DRESSINGS) ×3 IMPLANT
DURAPREP 26ML APPLICATOR (WOUND CARE) ×3 IMPLANT
ECCENTRIC EPIPHYSI MODULAR SZ1 (Trauma) IMPLANT
ELECT BLADE TIP CTD 4 INCH (ELECTRODE) ×3 IMPLANT
ELECT NDL TIP 2.8 STRL (NEEDLE) ×1 IMPLANT
ELECT NEEDLE TIP 2.8 STRL (NEEDLE) ×3 IMPLANT
ELECT REM PT RETURN 15FT ADLT (MISCELLANEOUS) ×3 IMPLANT
GAUZE SPONGE 4X4 12PLY STRL (GAUZE/BANDAGES/DRESSINGS) ×3 IMPLANT
GLENOSPHERE XTEND RSA 38 EC +4 (Joint) ×2 IMPLANT
GLOVE BIOGEL PI ORTHO PRO 7.5 (GLOVE) ×2
GLOVE BIOGEL PI ORTHO PRO SZ8 (GLOVE) ×2
GLOVE ORTHO TXT STRL SZ7.5 (GLOVE) ×3 IMPLANT
GLOVE PI ORTHO PRO STRL 7.5 (GLOVE) ×1 IMPLANT
GLOVE PI ORTHO PRO STRL SZ8 (GLOVE) ×1 IMPLANT
GLOVE SURG ORTHO 8.5 STRL (GLOVE) ×5 IMPLANT
GOWN STRL REUS W/TWL XL LVL3 (GOWN DISPOSABLE) ×6 IMPLANT
KIT BASIN OR (CUSTOM PROCEDURE TRAY) ×3 IMPLANT
KIT TURNOVER KIT A (KITS) IMPLANT
MANIFOLD NEPTUNE II (INSTRUMENTS) ×3 IMPLANT
METAGLENE DELTA EXTEND (Trauma) ×1 IMPLANT
METAGLENE DXTEND (Trauma) ×3 IMPLANT
MODULAR ECCENTRIC EPIPHYSI SZ1 (Trauma) ×3 IMPLANT
NDL MAYO CATGUT SZ4 TPR NDL (NEEDLE) IMPLANT
NEEDLE MAYO CATGUT SZ4 (NEEDLE) ×3 IMPLANT
NS IRRIG 1000ML POUR BTL (IV SOLUTION) ×3 IMPLANT
PACK SHOULDER (CUSTOM PROCEDURE TRAY) ×3 IMPLANT
PENCIL SMOKE EVACUATOR (MISCELLANEOUS) IMPLANT
PIN GUIDE 1.2 (PIN) ×2 IMPLANT
PIN GUIDE GLENOPHERE 1.5MX300M (PIN) ×2 IMPLANT
PIN METAGLENE 2.5 (PIN) ×4 IMPLANT
PROTECTOR NERVE ULNAR (MISCELLANEOUS) ×1 IMPLANT
RESTRAINT HEAD UNIVERSAL NS (MISCELLANEOUS) ×3 IMPLANT
SCREW 4.5X18MM (Screw) ×6 IMPLANT
SCREW 4.5X36MM (Screw) ×4 IMPLANT
SCREW BN 18X4.5XSTRL SHLDR (Screw) ×2 IMPLANT
SLING ARM FOAM STRAP LRG (SOFTGOODS) ×2 IMPLANT
SMARTMIX MINI TOWER (MISCELLANEOUS)
SPACER 38 PLUS 3 (Spacer) ×2 IMPLANT
SPONGE LAP 4X18 RFD (DISPOSABLE) IMPLANT
STAPLER VISISTAT 35W (STAPLE) ×2 IMPLANT
STEM DELTA DIA 10 HA (Stem) ×2 IMPLANT
STRIP CLOSURE SKIN 1/2X4 (GAUZE/BANDAGES/DRESSINGS) ×2 IMPLANT
SUCTION FRAZIER HANDLE 10FR (MISCELLANEOUS) ×2
SUCTION TUBE FRAZIER 10FR DISP (MISCELLANEOUS) ×1 IMPLANT
SUPPORT WRAP ARM LG (MISCELLANEOUS) ×2 IMPLANT
SUT FIBERWIRE #2 38 T-5 BLUE (SUTURE) ×6
SUT MNCRL AB 4-0 PS2 18 (SUTURE) ×3 IMPLANT
SUT VIC AB 0 CT1 36 (SUTURE) ×6 IMPLANT
SUT VIC AB 0 CT2 27 (SUTURE) ×3 IMPLANT
SUT VIC AB 2-0 CT1 27 (SUTURE) ×3
SUT VIC AB 2-0 CT1 TAPERPNT 27 (SUTURE) ×1 IMPLANT
SUTURE FIBERWR #2 38 T-5 BLUE (SUTURE) ×1 IMPLANT
TAPE CLOTH SURG 6X10 WHT LF (GAUZE/BANDAGES/DRESSINGS) ×3 IMPLANT
TOWEL OR 17X26 10 PK STRL BLUE (TOWEL DISPOSABLE) ×3 IMPLANT
TOWER SMARTMIX MINI (MISCELLANEOUS) IMPLANT
YANKAUER SUCT BULB TIP 10FT TU (MISCELLANEOUS) ×3 IMPLANT

## 2019-03-04 NOTE — Anesthesia Procedure Notes (Signed)
Anesthesia Regional Block: Interscalene brachial plexus block   Pre-Anesthetic Checklist: ,, timeout performed, Correct Patient, Correct Site, Correct Laterality, Correct Procedure, Correct Position, site marked, Risks and benefits discussed,  Surgical consent,  Pre-op evaluation,  At surgeon's request and post-op pain management  Laterality: Right  Prep: chloraprep       Needles:  Injection technique: Single-shot  Needle Type: Other     Needle Length: 9cm  Needle Gauge: 21   Needle insertion depth: 5 cm   Additional Needles:   Procedures:, nerve stimulator,,,,,,,   Nerve Stimulator or Paresthesia:  Response: 0.4 mA,   Additional Responses:   Narrative:  Start time: 03/04/2019 8:33 AM End time: 03/04/2019 8:43 AM Injection made incrementally with aspirations every 5 mL.  Performed by: Personally  Anesthesiologist: Lillia Abed, MD  Additional Notes: Monitors applied. Patient sedated. Sterile prep and drape,hand hygiene and sterile gloves were used. Needle position confirmed with evoked response at 0.4 mV.Local anesthetic injected incrementally after negative aspiration.Vascular puncture avoided. No complications. The patient tolerated the procedure well.

## 2019-03-04 NOTE — Interval H&P Note (Signed)
History and Physical Interval Note:  03/04/2019 9:31 AM  Jeffrey Ewing  has presented today for surgery, with the diagnosis of Right shoulder osteoarthritis rotator cuff insufficency.  The various methods of treatment have been discussed with the patient and family. After consideration of risks, benefits and other options for treatment, the patient has consented to  Procedure(s): REVERSE SHOULDER ARTHROPLASTY (Right) as a surgical intervention.  The patient's history has been reviewed, patient examined, no change in status, stable for surgery.  I have reviewed the patient's chart and labs.  Questions were answered to the patient's satisfaction.     Augustin Schooling

## 2019-03-04 NOTE — Brief Op Note (Signed)
03/04/2019  1:15 PM  PATIENT:  Jeffrey Ewing  78 y.o. male  PRE-OPERATIVE DIAGNOSIS:  Right shoulder osteoarthritis rotator cuff insufficency  POST-OPERATIVE DIAGNOSIS:  Right shoulder osteoarthritis rotator cuff insufficency  PROCEDURE:  Procedure(s): REVERSE SHOULDER ARTHROPLASTY (Right) DePuy Delta Xtend  SURGEON:  Surgeon(s) and Role:    Netta Cedars, MD - Primary  PHYSICIAN ASSISTANT:   ASSISTANTS: Dierdre Highman PA-C   ANESTHESIA:   regional and general  EBL:  800 mL   BLOOD ADMINISTERED:none  DRAINS: none   LOCAL MEDICATIONS USED:  MARCAINE     SPECIMEN:  No Specimen  DISPOSITION OF SPECIMEN:  N/A  COUNTS:  YES  TOURNIQUET:  * No tourniquets in log *  DICTATION: .Other Dictation: Dictation Number [id number]  PLAN OF CARE: Admit to inpatient   PATIENT DISPOSITION:  PACU - hemodynamically stable.   Delay start of Pharmacological VTE agent (>24hrs) due to surgical blood loss or risk of bleeding: no

## 2019-03-04 NOTE — Transfer of Care (Signed)
Immediate Anesthesia Transfer of Care Note  Patient: Jeffrey Ewing  Procedure(s) Performed: Procedure(s): REVERSE SHOULDER ARTHROPLASTY (Right)  Patient Location: PACU  Anesthesia Type:General  Level of Consciousness:  sedated, patient cooperative and responds to stimulation  Airway & Oxygen Therapy:Patient Spontanous Breathing and Patient connected to face mask oxgen  Post-op Assessment:  Report given to PACU RN and Post -op Vital signs reviewed and stable  Post vital signs:  Reviewed and stable  Last Vitals:  Vitals:   03/04/19 0911 03/04/19 1320  BP: (!) 112/58 (!) 90/43  Pulse: 65   Resp: 15 14  Temp:    SpO2: [id number]     Complications: No apparent anesthesia complications

## 2019-03-04 NOTE — Discharge Instructions (Signed)
Ice to the shoulder constantly.  Keep the incision covered and clean and dry for one week, then ok to get it wet in the shower. ° °Do exercise as instructed several times per day. ° °DO NOT reach behind your back or push up out of a chair with the operative arm. ° °Use a sling while you are up and around for comfort, may remove while seated.  Keep pillow propped behind the operative elbow. ° °Follow up with Dr Chenae Brager in two weeks in the office, call [phone] for appt °

## 2019-03-04 NOTE — Anesthesia Procedure Notes (Addendum)
Procedure Name: Intubation Date/Time: 03/04/2019 10:26 AM Performed by: Lavina Hamman, CRNA Pre-anesthesia Checklist: Patient identified, Emergency Drugs available, Suction available and Patient being monitored Patient Re-evaluated:Patient Re-evaluated prior to induction Oxygen Delivery Method: Circle System Utilized Preoxygenation: Pre-oxygenation with 100% oxygen Induction Type: IV induction Ventilation: Mask ventilation without difficulty Laryngoscope Size: Mac and 4 Grade View: Grade II Tube type: Oral Tube size: 7.5 mm Number of attempts: 1 Airway Equipment and Method: Stylet Placement Confirmation: ETT inserted through vocal cords under direct vision,  positive ETCO2 and breath sounds checked- equal and bilateral Secured at: 23 cm Tube secured with: Tape Dental Injury: Teeth and Oropharynx as per pre-operative assessment

## 2019-03-04 NOTE — Anesthesia Postprocedure Evaluation (Signed)
Anesthesia Post Note  Patient: Jeffrey Ewing  Procedure(s) Performed: REVERSE SHOULDER ARTHROPLASTY (Right Shoulder)     Patient location during evaluation: PACU Anesthesia Type: General Level of consciousness: awake and alert Pain management: pain level controlled Vital Signs Assessment: post-procedure vital signs reviewed and stable Respiratory status: spontaneous breathing, nonlabored ventilation, respiratory function stable and patient connected to nasal cannula oxygen Cardiovascular status: blood pressure returned to baseline and stable Postop Assessment: no apparent nausea or vomiting Anesthetic complications: no    Last Vitals:  Vitals:   03/04/19 0911 03/04/19 1320  BP: (!) 112/58 (!) 90/43  Pulse: 65 92  Resp: 15 14  Temp:  36.7 C  SpO2: 99% 98%    Last Pain:  Vitals:   03/04/19 1320  TempSrc:   PainSc: 0-No pain                 Chinelo Benn DAVID

## 2019-03-04 NOTE — Op Note (Signed)
Jeffrey Ewing, [facility] MEDICAL RECORD [id number] ACCOUNT [id number] DATE OF BIRTH:27-Sep-1940 FACILITY: [facility] LOCATION: [facility] PHYSICIAN:STEVEN R. 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  PROCEDURE PERFORMED:  Right reverse total shoulder arthroplasty with DePuy Delta Xtend prosthesis with subscapularis repair.  ATTENDING SURGEON:  Doran Heater. Veverly Fells, MD  ASSISTANT:  Dierdre Highman, PA-C.  ANESTHESIA:  General anesthesia was used plus interscalene block.  ESTIMATED BLOOD LOSS:  300 mL.  FLUID REPLACEMENT:  1500 mL crystalloid.  INSTRUMENT COUNTS:  Correct.  COMPLICATIONS:  No complications.  ANTIBIOTICS:  Perioperative antibiotics were given.  INDICATIONS:  The patient is a 78 year old male with worsening right shoulder pain and dysfunction secondary to end-stage stage arthritis with rotator cuff insufficiency.  The patient has had failure of conservative management.  Desires operative  treatment to restore function and eliminate pain.  Informed consent obtained.  DESCRIPTION OF PROCEDURE:  After an adequate level of anesthesia was achieved, the patient was positioned in modified beach chair position.  Right shoulder correctly identified and sterilely prepped and draped in the usual manner.  Time-out called,  verifying correct patient, correct site.  We used a deltopectoral incision and started the coracoid process extending down to the anterior humerus.  Dissection down through subcutaneous tissues using Bovie electrocautery.  Identified the cephalic vein,  took it laterally with the deltoid pectoralis taken medially and we identified the biceps tendon.  We whipstitched that and tenodesed in situ with the pectoralis tendon.  We then placed our deep retractors  underneath the coracoid process and conjoined  tendon medially and the deltoid laterally.  We released the subscap off the lesser tuberosity and tagged for repair at the end with #2 Hi-Fi suture in a modified Mason-Allen suture technique.  Next, we progressively externally rotated the humerus,  releasing the inferior capsule.  We then extended the shoulder.  We removed the remaining rotator cuff tissue and then entered the proximal humerus with a 6 mm reamer. We reamed up to a size 10.  We then used the 10 mm intramedullary resection guide to  resect the head at 10 degrees of retroversion with the oscillating saw at the appropriate height.  We then removed excess osteophytes around the humerus around the posterior aspect.  We then subluxed the humerus posteriorly gaining good exposure of the  glenoid face.  We did a 360 degree capsular labral excision, identifying the native glenoid and removing some osteophytes inferiorly.  We then placed our central guide pin.  We were pleased with that position.  We reamed for the metaglene baseplate and  then drilled out the central peg hole.  We impacted the metaglene baseplate into place using a 36 locked screw inferiorly and 36 locked screw superiorly had a good 18 screw posteriorly and a so-so purchase screw anteriorly.  At this point, we affixed a  38 eccentric +4 glenosphere to the baseplate.  We then went ahead and completed our preparation on the humeral side with the modular reamer for the metaphysis set on 1 right.  We then placed our trial 10 stem 1 right metaphyseal component set on the 0  setting and placed in 10 degrees of retroversion and impacted that and then tried to reduce the shoulder with a +3 poly and really could not.  I felt like the patient was long on the humeral side  so we made another neck cut removing 2 more millimeters of  bone.  We reamed again the metaphysis and then impacted the size 10 stem with a 1 right set on 0 setting and placed in  10 degrees of retroversion and then finally could get the shoulder reduced.  We felt like it was tight, but not excessively tight.   The conjoined was tight with about 30 degrees of extension and then the axillary nerve was under some tension, but not excessively so.  We removed the trial components.  We irrigated thoroughly.  We drilled holes in the lesser tuberosity and placed 2  FiberWire suture for repair of the subscapularis.  We then impaction grafted the HA coated press-fit stem, so it was a 10 stem 1 right metaphysis set on the 0 setting and placed in 10 degrees of retroversion with available bone graft utilized around the  canal from the head.  We were pleased with our security of our stem.  I do feel like there was a small little crack up at the proximal portion of the cortical bone at the head portion.  This did not extend into the metaphysis so we felt like the security  of the stem was in place.  We then reduced the shoulder with a 38+3 real poly and were happy with that soft tissue balancing.  We irrigated thoroughly and then repaired the subscapularis anatomically back to the lesser tuberosity.  We were pleased with  that.  That did not significantly restrict our range of motion.  We then closed the deltopectoral interval with 0 Vicryl suture followed by 2-0 Vicryl for subcutaneous closure and 4-0 Monocryl for skin.  Staples for skin as the patient did have a lot of  bleeding from his platelets being low and also likely ineffective, so we decided to go ahead and go with staples for a more secure closure.  The patient had a sterile dressing placed and was transported to recovery room in stable condition.  TN/NUANCE  D:03/04/2019 T:03/04/2019 JOB:[id number]

## 2019-03-04 NOTE — Progress Notes (Signed)
Assisted Dr. Ossey with right, ultrasound guided, interscalene  block. Side rails up, monitors on throughout procedure. See vital signs in flow sheet. Tolerated Procedure well. 

## 2019-03-05 LAB — BASIC METABOLIC PANEL
Anion gap: 8 (ref 5–15)
BUN: 13 mg/dL (ref 8–23)
CO2: 27 mmol/L (ref 22–32)
Calcium: 8.5 mg/dL — ABNORMAL LOW (ref 8.9–10.3)
Chloride: 102 mmol/L (ref 98–111)
Creatinine, Ser: 0.89 mg/dL (ref 0.61–1.24)
GFR calc Af Amer: >60 mL/min (ref >60)
GFR calc non Af Amer: >60 mL/min (ref >60)
Glucose, Bld: 154 mg/dL — ABNORMAL HIGH (ref 70–99)
Potassium: 4 mmol/L (ref 3.5–5.1)
Sodium: 137 mmol/L (ref 135–145)

## 2019-03-05 LAB — HEMOGLOBIN AND HEMATOCRIT, BLOOD
HCT: 27.7 % — ABNORMAL LOW (ref 39.0–52.0)
Hemoglobin: 9 g/dL — ABNORMAL LOW (ref 13.0–17.0)

## 2019-03-05 NOTE — Evaluation (Signed)
Occupational Therapy Evaluation Patient Details Name: Jeffrey Ewing MRN: [id number] DOB: December 23, 1940 Today's Date: 03/05/2019    History of Present Illness S/P shoulder replacement, right   Clinical Impression   OT eval and education complete.  Handout provided.  Elbow, wrist and hand ROM ok- no shoulder movement    Follow Up Recommendations  Follow surgeon's recommendation for DC plan and follow-up therapies          Precautions / Restrictions Precautions Precautions: Shoulder Type of Shoulder Precautions: no shoulder movement Shoulder Interventions: Shoulder sling/immobilizer Restrictions Weight Bearing Restrictions: Yes RUE Weight Bearing: Non weight bearing      Mobility Bed Mobility Overal bed mobility: Modified Independent                Transfers Overall transfer level: Modified independent                        ADL either performed or assessed with clinical judgement          Communication     Cognition Arousal/Alertness: Awake/alert Behavior During Therapy: WFL for tasks assessed/performed Overall Cognitive Status: Within Functional Limits for tasks assessed                                           Shoulder Instructions Shoulder Instructions Donning/doffing shirt without moving shoulder: Minimal assistance Method for sponge bathing under operated UE: Minimal assistance;Caregiver independent with task Donning/doffing sling/immobilizer: Minimal assistance;Caregiver independent with task Correct positioning of sling/immobilizer: Minimal assistance;Caregiver independent with task ROM for elbow, wrist and digits of operated UE: Minimal assistance;Caregiver independent with task Sling wearing schedule (on at all times/off for ADL's): Minimal assistance;Caregiver independent with task Proper positioning of operated UE when showering: Minimal assistance;Caregiver independent with task Positioning of UE while sleeping:  Minimal assistance;Caregiver independent with task    Home Living Family/patient expects to be discharged to:: Private residence Living Arrangements: Spouse/significant other Available Help at Discharge: Family Type of Home: House                                  Prior Functioning/Environment Level of Independence: Independent                          OT Goals(Current goals can be found in the care plan section) Acute Rehab OT Goals Patient Stated Goal: home today  OT Frequency:      AM-PAC OT "6 Clicks" Daily Activity     Outcome Measure Help from another person eating meals?: None Help from another person taking care of personal grooming?: None Help from another person toileting, which includes using toliet, bedpan, or urinal?: None Help from another person bathing (including washing, rinsing, drying)?: A Little Help from another person to put on and taking off regular upper body clothing?: A Little Help from another person to put on and taking off regular lower body clothing?: A Little 6 Click Score: 21   End of Session Nurse Communication: Mobility status  Activity Tolerance: Patient tolerated treatment well Patient left: in chair                   Time: 0950-1007 OT Time Calculation (min): 17 min Charges:  OT General Charges $OT Visit: 1 Visit OT Evaluation $OT Eval  Low Complexity: 1 Low  Kari Baars, OT Acute Rehabilitation Services [phone] Office- [phone], Edwena Felty D 03/05/2019, 1:09 PM

## 2019-03-05 NOTE — Discharge Summary (Signed)
Physician Discharge Summary  Patient ID: Jeffrey Ewing MRN: [id number] DOB/AGE: 10-20-1940 78 y.o.  Admit date: 03/04/2019 Discharge date: 03/05/2019  Admission Diagnoses:  S/P shoulder replacement, right  Discharge Diagnoses:  Principal Problem:   S/P shoulder replacement, right   Past Medical History:  Diagnosis Date  . 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 History of kidney stones   . History of prostate cancer 2008  . Hyperlipidemia   . 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 Melanoma ([place]) 2001   left cheek  . Osteoarthritis    shoulders, knees  . 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 male who was admitted 03/04/2019 with a diagnosis of S/P shoulder replacement, right and went to the operating room on 03/04/2019 and underwent the above named procedures.    He was given perioperative antibiotics:  Anti-infectives (From admission, onward)   Start     Dose/Rate Route Frequency Ordered Stop   03/04/19 1600  ceFAZolin (ANCEF) IVPB 2g/100 mL premix     2 g 200 mL/hr over 30 Minutes Intravenous Every 6 hours 03/04/19 1504 03/05/19 0518   03/04/19 0745  ceFAZolin (ANCEF) IVPB 2g/100 mL premix     2 g 200 mL/hr over 30 Minutes Intravenous On call to O.R. 03/04/19 [id number] 03/04/19 1033    .  He was given sequential compression devices, early ambulation for DVT prophylaxis.  He benefited maximally from the hospital stay and there were no complications.    Recent vital signs:  Vitals:   03/05/19 0458 03/05/19 0845  BP: (!) 102/52 (!) 112/54  Pulse: 93 97  Resp: 18 17  Temp: 98.3 F (36.8 C) 99 F (37.2 C)  SpO2: 94% 92%    Recent laboratory studies:  Lab Results  Component Value Date   HGB 9.0  (L) 03/05/2019   HGB 14.4 03/02/2019   HGB 10.4 (L) 12/22/2012   Lab Results  Component Value Date   WBC 5.8 03/02/2019   PLT 116 (L) 03/02/2019   Lab Results  Component Value Date   INR 0.99 12/14/2012   Lab Results  Component Value Date   NA 137 03/05/2019   K 4.0 03/05/2019   CL 102 03/05/2019   CO2 27 03/05/2019   BUN 13 03/05/2019   CREATININE 0.89 03/05/2019   GLUCOSE 154 (H) 03/05/2019    Discharge Medications:   Allergies as of 03/05/2019   No Known Allergies     Medication List    STOP taking these medications   naproxen sodium 220 MG tablet Commonly known as: ALEVE     TAKE these medications   Crestor 20 MG tablet Generic drug: rosuvastatin Take 20 mg by mouth daily.   dipyridamole-aspirin 200-25 MG 12hr capsule Commonly known as: AGGRENOX Take 1 capsule by mouth 2 (two) times daily.   ezetimibe 10 MG tablet Commonly known as: ZETIA Take 1 tablet (10 mg total) by mouth daily. What changed: when to take this   meclizine 25 MG tablet Commonly known as: ANTIVERT Take 25 mg by mouth 3 (three) times daily as needed for dizziness.   ondansetron 4 MG tablet Commonly known as: Zofran Take 1 tablet (4 mg total) by  mouth every 8 (eight) hours as needed for nausea or vomiting.   oxyCODONE-acetaminophen 5-325 MG tablet Commonly known as: Percocet Take 1 tablet by mouth every 4 (four) hours as needed for severe pain.       Diagnostic Studies: Dg Shoulder Right Port  Result Date: 03/04/2019 CLINICAL DATA:  Status post right shoulder arthroplasty EXAM: PORTABLE RIGHT SHOULDER COMPARISON:  None. FINDINGS: Status post right total shoulder arthroplasty with right glenoid and proximal right humeral prostheses. No evidence of glenohumeral dislocation on this single portable frontal view. No osseous fracture or suspicious focal osseous lesions. Skin staples overlie the right shoulder. Expected soft tissue gas surrounding the right shoulder. IMPRESSION: Status  post right total shoulder arthroplasty. No evidence of glenohumeral dislocation on this single frontal portable view. Electronically Signed   By: Ilona Sorrel M.D.   On: 03/04/2019 14:54    Disposition: Discharge disposition: 01-Home or Self Care       Discharge Instructions    Call MD / Call 911   Complete by: As directed    If you experience chest pain or shortness of breath, CALL 911 and be transported to the hospital emergency room.  If you develope a fever above 101 F, pus (white drainage) or increased drainage or redness at the wound, or calf pain, call your surgeon's office.   Constipation Prevention   Complete by: As directed    Drink plenty of fluids.  Prune juice may be helpful.  You may use a stool softener, such as Colace (over the counter) 100 mg twice a day.  Use MiraLax (over the counter) for constipation as needed.   Diet - low sodium heart healthy   Complete by: As directed    Discharge instructions   Complete by: As directed    Follow Dr. Veverly Fells' discharge instructions   Increase activity slowly as tolerated   Complete by: As directed       Follow-up Information    Netta Cedars, MD. Call in 2 weeks.   Specialty: Orthopedic Surgery Why: [id number] Contact information: [address] [place] [address] [id number]            Signed: Hilton Cork Ivis Nicolson 03/05/2019, 9:00 AM

## 2019-03-05 NOTE — Progress Notes (Addendum)
    Subjective:  Patient reports pain as mild to moderate.  Denies N/V/CP/SOB. No c/o.  Objective:   VITALS:   Vitals:   03/04/19 2140 03/05/19 0111 03/05/19 0458 03/05/19 0845  BP: (!) 107/49 (!) 102/53 (!) 102/52 (!) 112/54  Pulse: 94 88 93 97  Resp: 17 18 18 17   Temp: 98.4 F (36.9 C) 98.6 F (37 C) 98.3 F (36.8 C) 99 F (37.2 C)  TempSrc:      SpO2: 96% 94% 94% 92%  Weight:      Height:        NAD ABD soft Incision: dressing C/D/I  (+) AIN, PIN, ulnar SILT 2+ radial Dressing changed: incis c/d/i   Lab Results  Component Value Date   WBC 5.8 03/02/2019   HGB 9.0 (L) 03/05/2019   HCT 27.7 (L) 03/05/2019   MCV 107.7 (H) 03/02/2019   PLT 116 (L) 03/02/2019   BMET    Component Value Date/Time   NA 137 03/05/2019 0221   K 4.0 03/05/2019 0221   CL 102 03/05/2019 0221   CO2 27 03/05/2019 0221   GLUCOSE 154 (H) 03/05/2019 0221   BUN 13 03/05/2019 0221   CREATININE 0.89 03/05/2019 0221   CALCIUM 8.5 (L) 03/05/2019 0221   GFRNONAA >60 03/05/2019 0221   GFRAA >60 03/05/2019 0221     Assessment/Plan: 1 Day Post-Op    Active Problems:   S/P shoulder replacement, right   WBAT with walker DVT ppx: SCDs, TEDS PO pain control PT/OT Dispo: D/C home with HEP   Hilton Cork Celester Morgan 03/05/2019, 8:47 AM   Rod Can, MD [phone] [place] [facility] now [facility]  [place] [address], [place], [place] [address] Phone: [phone] [email or web address] Facebook  [employer]

## 2019-03-07 ENCOUNTER — Encounter (HOSPITAL_COMMUNITY): Payer: Self-pay | Admitting: Orthopedic Surgery

## 2019-04-20 ENCOUNTER — Ambulatory Visit: Payer: Medicare Other | Attending: Internal Medicine

## 2019-04-20 DIAGNOSIS — Z23 Encounter for immunization: Secondary | ICD-10-CM

## 2019-04-20 NOTE — Progress Notes (Signed)
   Covid-19 Vaccination Clinic  Name:  Jeffrey Ewing    MRN: [id number] DOB: 04/06/1940  04/20/2019  Mr. Delawder was observed post Covid-19 immunization for 15 minutes without incidence. He was provided with Vaccine Information Sheet and instruction to access the V-Safe system.   Mr. Conteh was instructed to call 911 with any severe reactions post vaccine: Marland Kitchen Difficulty breathing  . Swelling of your face and throat  . A fast heartbeat  . A bad rash all over your body  . Dizziness and weakness    Immunizations Administered    Name Date Dose VIS Date Route   Pfizer COVID-19 Vaccine 04/20/2019 11:36 AM 0.3 mL 03/11/2019 Intramuscular   Manufacturer: [place]   Lot: [id number]   [place]: [id number]

## 2019-05-09 ENCOUNTER — Ambulatory Visit: Payer: Medicare Other | Attending: Internal Medicine

## 2019-05-09 DIAGNOSIS — Z23 Encounter for immunization: Secondary | ICD-10-CM | POA: Insufficient documentation

## 2019-05-09 NOTE — Progress Notes (Signed)
   Covid-19 Vaccination Clinic  Name:  Jeffrey Ewing    MRN: [id number] DOB: [birth year]  05/09/2019  Jeffrey Ewing was observed post Covid-19 immunization for 15 minutes without incidence. He was provided with Vaccine Information Sheet and instruction to access the V-Safe system.   Jeffrey Ewing was instructed to call 911 with any severe reactions post vaccine: Marland Kitchen Difficulty breathing  . Swelling of your face and throat  . A fast heartbeat  . A bad rash all over your body  . Dizziness and weakness    Immunizations Administered    Name Date Dose VIS Date Route   Pfizer COVID-19 Vaccine 05/09/2019 11:08 AM 0.3 mL 03/11/2019 Intramuscular   Manufacturer: [place]   Lot: [id number]   [place]: [id number]

## 2019-05-13 ENCOUNTER — Other Ambulatory Visit: Payer: Self-pay | Admitting: Cardiology

## 2019-08-01 DIAGNOSIS — M4310 Spondylolisthesis, site unspecified: Secondary | ICD-10-CM | POA: Insufficient documentation

## 2019-08-01 DIAGNOSIS — M542 Cervicalgia: Secondary | ICD-10-CM | POA: Insufficient documentation

## 2019-08-01 DIAGNOSIS — I779 Disorder of arteries and arterioles, unspecified: Secondary | ICD-10-CM | POA: Insufficient documentation

## 2019-08-02 ENCOUNTER — Other Ambulatory Visit: Payer: Self-pay | Admitting: Internal Medicine

## 2019-08-02 DIAGNOSIS — R011 Cardiac murmur, unspecified: Secondary | ICD-10-CM

## 2019-08-12 ENCOUNTER — Other Ambulatory Visit: Payer: Self-pay

## 2019-08-12 ENCOUNTER — Ambulatory Visit: Payer: Medicare Other

## 2019-08-12 DIAGNOSIS — R011 Cardiac murmur, unspecified: Secondary | ICD-10-CM

## 2019-09-05 DIAGNOSIS — M25532 Pain in left wrist: Secondary | ICD-10-CM | POA: Insufficient documentation

## 2019-09-05 DIAGNOSIS — G8929 Other chronic pain: Secondary | ICD-10-CM | POA: Insufficient documentation

## 2019-09-08 DIAGNOSIS — M654 Radial styloid tenosynovitis [de Quervain]: Secondary | ICD-10-CM | POA: Insufficient documentation

## 2019-10-05 DIAGNOSIS — S20219A Contusion of unspecified front wall of thorax, initial encounter: Secondary | ICD-10-CM | POA: Insufficient documentation

## 2019-10-05 DIAGNOSIS — W138XXA Fall from, out of or through other building or structure, initial encounter: Secondary | ICD-10-CM | POA: Insufficient documentation

## 2019-10-05 DIAGNOSIS — R0781 Pleurodynia: Secondary | ICD-10-CM | POA: Insufficient documentation

## 2019-10-05 DIAGNOSIS — S60221A Contusion of right hand, initial encounter: Secondary | ICD-10-CM | POA: Insufficient documentation

## 2019-11-01 ENCOUNTER — Other Ambulatory Visit: Payer: Self-pay | Admitting: Cardiology

## 2019-11-22 ENCOUNTER — Other Ambulatory Visit: Payer: Medicare Other

## 2019-11-22 ENCOUNTER — Other Ambulatory Visit: Payer: Self-pay

## 2019-11-22 ENCOUNTER — Other Ambulatory Visit: Payer: Self-pay | Admitting: *Deleted

## 2019-11-22 DIAGNOSIS — Z20822 Contact with and (suspected) exposure to covid-19: Secondary | ICD-10-CM

## 2019-11-23 LAB — SARS-COV-2, NAA 2 DAY TAT

## 2019-11-23 LAB — NOVEL CORONAVIRUS, NAA: SARS-CoV-2, NAA: NOT DETECTED

## 2019-11-29 DIAGNOSIS — R2689 Other abnormalities of gait and mobility: Secondary | ICD-10-CM | POA: Insufficient documentation

## 2019-11-29 DIAGNOSIS — H9113 Presbycusis, bilateral: Secondary | ICD-10-CM | POA: Insufficient documentation

## 2020-01-23 ENCOUNTER — Ambulatory Visit: Payer: Medicare Other | Attending: Internal Medicine | Admitting: Physical Therapy

## 2020-01-23 ENCOUNTER — Other Ambulatory Visit: Payer: Self-pay

## 2020-01-23 ENCOUNTER — Encounter: Payer: Self-pay | Admitting: Physical Therapy

## 2020-01-23 DIAGNOSIS — R42 Dizziness and giddiness: Secondary | ICD-10-CM | POA: Diagnosis not present

## 2020-01-23 DIAGNOSIS — R2681 Unsteadiness on feet: Secondary | ICD-10-CM | POA: Diagnosis present

## 2020-01-23 DIAGNOSIS — R262 Difficulty in walking, not elsewhere classified: Secondary | ICD-10-CM | POA: Insufficient documentation

## 2020-01-23 NOTE — Therapy (Signed)
[facility] [address] [place], [place], [address] Phone: [phone]   Fax:  [phone]  Physical Therapy Evaluation  Patient Details  Name: Jeffrey Ewing MRN: [id number] Date of Birth: [birth year] Referring Provider (PT): Crist Infante, MD   Encounter Date: 01/23/2020   PT End of Session - 01/23/20 1705    Visit Number 1    Number of Visits 7    Date for PT Re-Evaluation 03/08/20    Authorization Type U[employer] Medicare; $35 copay    Progress Note Due on Visit 10    PT Start Time 1404    PT Stop Time 1449    PT Time Calculation (min) 45 min    Activity Tolerance Patient tolerated treatment well    Behavior During Therapy [facility] for tasks assessed/performed           Past Medical History:  Diagnosis Date  . 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 History of kidney stones   . History of prostate cancer 2008  . Hyperlipidemia   . 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 Melanoma ([place]) 2001   left cheek  . Osteoarthritis    shoulders, knees  . 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 COLONOSCOPY    . COLONOSCOPY W/ POLYPECTOMY  Oct. 2013  . JOINT REPLACEMENT Bilateral   . KNEE ARTHROSCOPY  2009   left  . MASS EXCISION Right 04/03/2016   Procedure: EXCISION cyst right index finger abd debridement proximal interphalangeal;  Surgeon: Daryll Brod, MD;  Location: [facility];  Service: Orthopedics;  Laterality: Right;  FAB  . PROSTATECTOMY  2008  . removal melanoma  2001   left cheek  . REVERSE SHOULDER ARTHROPLASTY Right 03/04/2019   Procedure: REVERSE SHOULDER ARTHROPLASTY;  Surgeon: Netta Cedars, MD;  Location: [facility];  Service: Orthopedics;  Laterality: Right;  . TONSILLECTOMY  as child  . TOTAL KNEE ARTHROPLASTY Right 12/20/2012   Procedure: RIGHT  TOTAL KNEE ARTHROPLASTY;  Surgeon: Gearlean Alf, MD;  Location: [facility];  Service: Orthopedics;  Laterality: Right;  . TOTAL KNEE ARTHROPLASTY Bilateral   . VASECTOMY  1978    There were no vitals filed for this visit.    Subjective Assessment - 01/23/20 1415    Subjective First onset of dizziness was last summer (2020) and it was classic BPPV symptoms, resolved on its own.  This summer pt did not have true spinning but had more imbalance or disequilibrium and tinnitus; this episode has lasted a lot longer.  Does have hearing loss.  Denies nausea or vomiting.  Pt did have eye exam and does have new glasses which feel too strong - trifocals.  Does feel better when he gets a good night's rest - has not been diagnosed with sleep apnea.  Does have bilat TKA but feels like they are getting weaker.    Pertinent History melanoma, heart defect, OA, R shoulder replacement, dizziness, HLD, CVA in 2005, R hand surgery, bilat knee TKA in 2014/2016, prostate surgery, tinnitus, hearing loss    Currently in Pain? No/denies              [facility] PT Assessment - 01/23/20 1419      Assessment   Medical Diagnosis Disequilibrium    Referring Provider (  PT) Crist Infante, MD    Onset Date/Surgical Date 01/17/20    Prior Therapy not for vestibular rehab      Precautions   Precautions Fall    Precaution Comments  melanoma, heart defect, OA, R shoulder replacement, dizziness, HLD, CVA in 2005, R hand surgery, bilat knee TKA in 2014/2016, prostate surgery, tinnitus, hearing loss      Balance Screen   Has the patient fallen in the past 6 months No    Has the patient had a decrease in activity level because of a fear of falling?  Yes      [place] Private residence    Living Arrangements Spouse/significant other    Type of [place] to enter    Home Layout Two level;Able to live on main level with bedroom/bathroom    Home Equipment Other (comment)     Additional Comments used walking stick when feeling off balance      Prior Function   Level of Independence Independent    Leisure Still driving      Observation/Other Assessments   Focus on Therapeutic Outcomes (FOTO)  69%    Other Surveys  Dizziness Handicap Inventory (DHI)    Dizziness Handicap Inventory (DHI)  16 mild      Sensation   Light Touch Appears Intact      Coordination   Gross Motor Movements are Fluid and Coordinated Yes    Fine Motor Movements are Fluid and Coordinated Yes    Finger Nose Finger Test [facility]    Heel Shin Test [facility]      ROM / Strength   AROM / PROM / Strength Strength      Strength   Overall Strength Within functional limits for tasks performed                  Vestibular Assessment - 01/23/20 1423      Symptom Behavior   Subjective history of current problem Tinnitus in R ear, aural fullness R ear.      Type of Dizziness  Imbalance   Disequilibrium   Frequency of Dizziness intermittent    Duration of Dizziness 30-40 minutes    Symptom Nature Spontaneous;Motion provoked    Aggravating Factors Comment;Walking in a crowd;Spontaneous onset   Crowds   Relieving Factors Rest    Progression of Symptoms Better      Oculomotor Exam   Oculomotor Alignment Normal    Ocular ROM WFL    Spontaneous Absent    Gaze-induced  Absent    Smooth Pursuits Intact    Saccades Intact      Oculomotor Exam-Fixation Suppressed    Left Head Impulse positive    Right Head Impulse negative      Vestibulo-Ocular Reflex   VOR to Slow Head Movement Normal    VOR Cancellation Normal      Visual Acuity   Static 8    Dynamic 4   able to reach half of line 4     Positional Testing   Dix-Hallpike Dix-Hallpike Right;Dix-Hallpike Left    Horizontal Canal Testing Horizontal Canal Right;Horizontal Canal Left      Dix-Hallpike Right   Dix-Hallpike Right Duration 0    Dix-Hallpike Right Symptoms No nystagmus      Dix-Hallpike Left   Dix-Hallpike Left  Duration 0    Dix-Hallpike Left Symptoms No nystagmus      Horizontal Canal Right   Horizontal Canal Right  Duration 0    Horizontal Canal Right Symptoms Normal      Horizontal Canal Left   Horizontal Canal Left Duration 0    Horizontal Canal Left Symptoms Normal              Objective measurements completed on examination: See above findings.               PT Education - 01/23/20 1704    Education Details Clinical findings, PT POC and goals    Person(s) Educated Patient    Methods Explanation    Comprehension Verbalized understanding            PT Short Term Goals - 01/23/20 1711      PT SHORT TERM GOAL #1   Title Pt will participate in assessment of FGA    Time 3    Period Weeks    Status New    Target Date 02/13/20      PT SHORT TERM GOAL #2   Title Pt will initiate balance and vestibular HEP    Time 3    Period Weeks    Status New    Target Date 02/13/20             PT Long Term Goals - 01/23/20 1712      PT LONG TERM GOAL #1   Title Pt will demonstrate independence with final vestibular/balance HEP    Time 6    Period Weeks    Status New    Target Date 03/08/20      PT LONG TERM GOAL #2   Title Pt will increase FOTO score to >/= 77% and will decrease DHI by 10 points    Baseline 69%; DHI: 16    Time 6    Period Weeks    Status New    Target Date 03/08/20      PT LONG TERM GOAL #3   Title Pt will increase FGA score by 4 points to indicate decreased falls risk    Baseline TBD    Time 6    Period Weeks    Status New    Target Date 03/08/20      PT LONG TERM GOAL #4   Title Pt will demonstrate improved use of VOR as indicated by 2-3 line difference on DVA    Baseline 4 line difference (8, 4)    Time 6    Period Weeks    Status New    Target Date 03/08/20                  Plan - 01/23/20 1706    Clinical Impression Statement Pt is a 79 year old male referred to Neuro OPPT for evaluation of disequilibrium and  imbalance.  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  Pt would benefit from skilled PT to address these impairments and functional limitations to maximize functional mobility independence and reduce falls risk.    Personal Factors and Comorbidities Age;Comorbidity 3+;Past/Current Experience    Comorbidities  melanoma, heart defect, OA, R shoulder replacement, dizziness, HLD, CVA in 2005, R hand surgery, bilat knee TKA in 2014/2016, prostate surgery, tinnitus, hearing loss    Examination-Activity Limitations Locomotion Level    Examination-Participation Restrictions Church;Community Activity    Stability/Clinical Decision Making Stable/Uncomplicated    Clinical Decision Making Low    Rehab Potential Good    PT Frequency 1x / week    PT Duration 6 weeks    PT Treatment/Interventions ADLs/Self Care Home Management;Canalith Repostioning;Gait training;Stair training;Functional mobility training;Therapeutic activities;Therapeutic exercise;Balance training;Neuromuscular re-education;Patient/family education;Vestibular    PT Next Visit Plan L hypofunction.  Assess FGA and reset goals.  Initiate HEP focusing on x1 viewing, corner balance, balance reactions    Consulted and Agree with Plan of Care Patient           Patient will benefit from skilled therapeutic intervention in order to improve the following deficits and impairments:  Decreased balance, Difficulty walking, Dizziness  Visit  Diagnosis: Dizziness and giddiness  Unsteadiness on feet  Difficulty in walking, not elsewhere classified     Problem List Patient Active Problem List   Diagnosis Date Noted  . 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 Dizziness 10/15/2018  . Malignant melanoma of face excluding eyelid, nose, lip, and ear ([place]) 10/15/2018  . Arthritis of hand 06/24/2017  . Degenerative joint disease of shoulder region 05/26/2017  . Mucoid cyst, joint 02/27/2016  . 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 Postoperative anemia due to acute blood loss 12/21/2012  . OA (osteoarthritis) of knee 12/20/2012    Rico Junker, PT, DPT 01/23/20    5:15 PM    [facility] [address] [place], [place], [address] Phone: [phone]   Fax:  [phone]  Name: Jeffrey Ewing MRN: [id number] Date of Birth: May 21, 1940

## 2020-02-01 ENCOUNTER — Ambulatory Visit: Payer: Medicare Other | Attending: Internal Medicine

## 2020-02-01 ENCOUNTER — Other Ambulatory Visit: Payer: Self-pay

## 2020-02-01 DIAGNOSIS — R262 Difficulty in walking, not elsewhere classified: Secondary | ICD-10-CM | POA: Diagnosis present

## 2020-02-01 DIAGNOSIS — R42 Dizziness and giddiness: Secondary | ICD-10-CM | POA: Diagnosis not present

## 2020-02-01 DIAGNOSIS — R2681 Unsteadiness on feet: Secondary | ICD-10-CM | POA: Insufficient documentation

## 2020-02-01 NOTE — Therapy (Signed)
[facility] [address] [place], [place], [address] Phone: [phone]   Fax:  [phone]  Physical Therapy Treatment  Patient Details  Name: Jeffrey Ewing MRN: [id number] Date of Birth: 04/19/1940 Referring Provider (PT): Crist Infante, MD   Encounter Date: 02/01/2020   PT End of Session - 02/01/20 1321    Visit Number 2    Number of Visits 7    Date for PT Re-Evaluation 03/08/20    Authorization Type UHC Medicare; $35 copay    Progress Note Due on Visit 10    PT Start Time 1317    PT Stop Time 1400    PT Time Calculation (min) 43 min    Equipment Utilized During Treatment Gait belt    Activity Tolerance Patient tolerated treatment well    Behavior During Therapy [facility] for tasks assessed/performed           Past Medical History:  Diagnosis Date  . 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 History of kidney stones   . History of prostate cancer 2008  . Hyperlipidemia   . 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 Melanoma ([place]) 2001   left cheek  . Osteoarthritis    shoulders, knees  . 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 COLONOSCOPY    . COLONOSCOPY W/ POLYPECTOMY  Oct. 2013  . JOINT REPLACEMENT Bilateral   . KNEE ARTHROSCOPY  2009   left  . MASS EXCISION Right 04/03/2016   Procedure: EXCISION cyst right index finger abd debridement proximal interphalangeal;  Surgeon: Daryll Brod, MD;  Location: [facility];  Service: Orthopedics;  Laterality: Right;  FAB  . PROSTATECTOMY  2008  . removal melanoma  2001   left cheek  . REVERSE SHOULDER ARTHROPLASTY Right 03/04/2019   Procedure: REVERSE SHOULDER ARTHROPLASTY;  Surgeon: Netta Cedars, MD;  [facility];  Service: Orthopedics;  Laterality: Right;  . TONSILLECTOMY  as child  . TOTAL KNEE  ARTHROPLASTY Right 12/20/2012   Procedure: RIGHT TOTAL KNEE ARTHROPLASTY;  Surgeon: Gearlean Alf, MD;  [facility];  Service: Orthopedics;  Laterality: Right;  . TOTAL KNEE ARTHROPLASTY Bilateral   . VASECTOMY  1978    There were no vitals filed for this visit.   Subjective Assessment - 02/01/20 1321    Subjective Patient reports been doing well. Patient contineus to feel imbalance/dysequilbirum. Reports that in his L ear he felt like water was gurling in the ear. No falls.    Pertinent History melanoma, heart defect, OA, R shoulder replacement, dizziness, HLD, CVA in 2005, R hand surgery, bilat knee TKA in 2014/2016, prostate surgery, tinnitus, hearing loss    Currently in Pain? No/denies              [facility] PT Assessment - 02/01/20 1327      Functional Gait  Assessment   Gait assessed  Yes    Gait Level Surface Walks 20 ft in less than 7 sec but greater than 5.5 sec, uses assistive device, slower speed, mild gait deviations, or deviates 6-10 in outside of the 12 in walkway width.    Change in Gait Speed Able to smoothly change walking speed without loss of balance or gait deviation. Deviate no more than 6 in outside of  the 12 in walkway width.    Gait with Horizontal Head Turns Performs head turns smoothly with slight change in gait velocity (eg, minor disruption to smooth gait path), deviates 6-10 in outside 12 in walkway width, or uses an assistive device.    Gait with Vertical Head Turns Performs task with slight change in gait velocity (eg, minor disruption to smooth gait path), deviates 6 - 10 in outside 12 in walkway width or uses assistive device    Gait and Pivot Turn Pivot turns safely within 3 sec and stops quickly with no loss of balance.    Step Over Obstacle Is able to step over one shoe box (4.5 in total height) without changing gait speed. No evidence of imbalance.    Gait with Narrow Base of Support Ambulates 7-9 steps.    Gait with Eyes Closed Walks 20 ft, slow  speed, abnormal gait pattern, evidence for imbalance, deviates 10-15 in outside 12 in walkway width. Requires more than 9 sec to ambulate 20 ft.    Ambulating Backwards Walks 20 ft, uses assistive device, slower speed, mild gait deviations, deviates 6-10 in outside 12 in walkway width.    Steps Alternating feet, no rail.    Total Score 22    FGA comment: 22/30 = Medium Fall Risk                  Vestibular Treatment/Exercise - 02/01/20 0001      Vestibular Treatment/Exercise   Vestibular Treatment Provided Gaze    Gaze Exercises X1 Viewing Horizontal;X1 Viewing Vertical      X1 Viewing Horizontal   Foot Position Seated    Reps 2    Comments 45 secs each. 1st rep self selected pace, 2nd rep PT cueing on pace.       X1 Viewing Vertical   Foot Position Seated    Reps 2    Comments 45 secs each. 1st rep self selected pace, 2nd rep PT cueing on pace           Completed all of the following exercises as established initial balance HEP. Patient tolerating all exercises well, increased sway noted. Educated to complete balance exercises in a corner for improved safety.   Access Code: [email or web address] URL: [email or web address] Date: 02/01/2020 Prepared by: Baldomero Lamy  Exercises Standing with Feet Apart and Eyes Closed on Pillow - 1 x daily - 5 x weekly - 1 sets - 3 reps - 30 hold Standing with Head Rotation on Pillow - 1 x daily - 5 x weekly - 2 sets - 10 reps Standing with Head Nod on Pillow - 1 x daily - 5 x weekly - 2 sets - 10 reps       PT Education - 02/01/20 1451    Education Details FGA Results: Initial HEP (VOR x 1, Balance Exercises)    Person(s) Educated Patient    Methods Explanation;Demonstration;Handout    Comprehension Verbalized understanding;Returned demonstration            PT Short Term Goals - 02/01/20 1456      PT SHORT TERM GOAL #1   Title Pt will participate in assessment of FGA    Baseline 22/30    Time 3    Period Weeks     Status Achieved    Target Date 02/13/20      PT SHORT TERM GOAL #2   Title Pt will initiate balance and vestibular HEP    Baseline HEP initiated    Time  3    Period Weeks    Status Achieved    Target Date 02/13/20             PT Long Term Goals - 02/01/20 1457      PT LONG TERM GOAL #1   Title Pt will demonstrate independence with final vestibular/balance HEP    Time 6    Period Weeks    Status New      PT LONG TERM GOAL #2   Title Pt will increase FOTO score to >/= 77% and will decrease DHI by 10 points    Baseline 69%; DHI: 16    Time 6    Period Weeks    Status New      PT LONG TERM GOAL #3   Title Pt will increase FGA score by 4 points to indicate decreased falls risk    Baseline 22/30    Time 6    Period Weeks    Status New      PT LONG TERM GOAL #4   Title Pt will demonstrate improved use of VOR as indicated by 2-3 line difference on DVA    Baseline 4 line difference (8, 4)    Time 6    Period Weeks    Status New                 Plan - 02/01/20 1452    Clinical Impression Statement Today's skilled PT session included assessment of balance with FGA, patient scoring 22/30 demos medium fall risk. Rest of session spent establishing initial vestibular/balance HEP. Initiated VOR x 1 seated in horizontal/vertical direction, as well as balance exercises on complaint surfaces with patient tolerating well. Will continue to progress toward all goals.    Personal Factors and Comorbidities Age;Comorbidity 3+;Past/Current Experience    Comorbidities melanoma, heart defect, OA, R shoulder replacement, dizziness, HLD, CVA in 2005, R hand surgery, bilat knee TKA in 2014/2016, prostate surgery, tinnitus, hearing loss    Examination-Activity Limitations Locomotion Level    Examination-Participation Restrictions Church;Community Activity    Stability/Clinical Decision Making Stable/Uncomplicated    Rehab Potential Good    PT Frequency 1x / week    PT Duration 6 weeks     PT Treatment/Interventions ADLs/Self Care Home Management;Canalith Repostioning;Gait training;Stair training;Functional mobility training;Therapeutic activities;Therapeutic exercise;Balance training;Neuromuscular re-education;Patient/family education;Vestibular    PT Next Visit Plan L Hypofunction. How was HEP? progress x 1 viewing, balance exercises.    Consulted and Agree with Plan of Care Patient           Patient will benefit from skilled therapeutic intervention in order to improve the following deficits and impairments:  Decreased balance, Difficulty walking, Dizziness  Visit Diagnosis: Dizziness and giddiness  Unsteadiness on feet  Difficulty in walking, not elsewhere classified     Problem List Patient Active Problem List   Diagnosis Date Noted  . 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 Dizziness 10/15/2018  . Malignant melanoma of face excluding eyelid, nose, lip, and ear ([place]) 10/15/2018  . Arthritis of hand 06/24/2017  . Degenerative joint disease of shoulder region 05/26/2017  . Mucoid cyst, joint 02/27/2016  . 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 Postoperative anemia due to acute blood loss 12/21/2012  . OA (osteoarthritis) of knee 12/20/2012    Jones Bales, PT, DPT 02/01/2020, 2:57 PM  [facility] [address] [place], [place], [address] Phone: [phone]   Fax:  [phone]  Name: Martise  Jeffrey Ewing MRN: [id number] Date of Birth: [birth year]

## 2020-02-01 NOTE — Patient Instructions (Addendum)
Gaze Stabilization: Sitting    Keeping eyes on target on wall 3-4 feet away, tilt head down 15-30 and move head side to side for 60 seconds. Repeat while moving head up and down for 60 seconds. Do 2-3 sessions per day.  Copyright  VHI. All rights reserved.       Access Code: [email or web address] URL: [email or web address] Date: 02/01/2020 Prepared by: Baldomero Lamy  Exercises Standing with Feet Apart and Eyes Closed on Pillow - 1 x daily - 5 x weekly - 1 sets - 3 reps - 30 hold Standing with Head Rotation on Pillow - 1 x daily - 5 x weekly - 2 sets - 10 reps Standing with Head Nod on Pillow - 1 x daily - 5 x weekly - 2 sets - 10 reps

## 2020-02-08 ENCOUNTER — Ambulatory Visit: Payer: Medicare Other

## 2020-02-08 ENCOUNTER — Other Ambulatory Visit: Payer: Self-pay

## 2020-02-08 DIAGNOSIS — R2681 Unsteadiness on feet: Secondary | ICD-10-CM

## 2020-02-08 DIAGNOSIS — R42 Dizziness and giddiness: Secondary | ICD-10-CM

## 2020-02-08 DIAGNOSIS — R262 Difficulty in walking, not elsewhere classified: Secondary | ICD-10-CM

## 2020-02-08 NOTE — Patient Instructions (Signed)
Gaze Stabilization: Standing Feet Together    Feet together, keeping eyes on target on wall 3-4 feet away, tilt head down 15-30 and move head side to side for 60 seconds. Repeat while moving head up and down for 60 seconds. Do 3-4 sessions per day. Repeat using target on pattern background.  Copyright  VHI. All rights reserved.

## 2020-02-08 NOTE — Therapy (Signed)
[facility] [address] [place], [place], [address] Phone: [phone]   Fax:  [phone]  Physical Therapy Treatment  Patient Details  Name: Jeffrey Ewing MRN: [id number] Date of Birth: [birth year] Referring Provider (PT): Crist Infante, MD   Encounter Date: 02/08/2020   PT End of Session - 02/08/20 1322    Visit Number 3    Number of Visits 7    Date for PT Re-Evaluation 03/08/20    Authorization Type [employer] Medicare; $35 copay    Progress Note Due on Visit 10    PT Start Time 1316    PT Stop Time 1400    PT Time Calculation (min) 44 min    Equipment Utilized During Treatment Gait belt    Activity Tolerance Patient tolerated treatment well    Behavior During Therapy Jeffrey Ewing for tasks assessed/performed           Past Medical History:  Diagnosis Date  . 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 History of kidney stones   . History of prostate cancer 2008  . Hyperlipidemia   . 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 Melanoma ([place]) 2001   left cheek  . Osteoarthritis    shoulders, knees  . 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 COLONOSCOPY    . COLONOSCOPY W/ POLYPECTOMY  Oct. 2013  . JOINT REPLACEMENT Bilateral   . KNEE ARTHROSCOPY  2009   left  . MASS EXCISION Right 04/03/2016   Procedure: EXCISION cyst right index finger abd debridement proximal interphalangeal;  Surgeon: Daryll Brod, MD;  Location: [facility];  Service: Orthopedics;  Laterality: Right;  FAB  . PROSTATECTOMY  2008  . removal melanoma  2001   left cheek  . REVERSE SHOULDER ARTHROPLASTY Right 03/04/2019   Procedure: REVERSE SHOULDER ARTHROPLASTY;  Surgeon: Netta Cedars, MD;  Location: [facility];  Service: Orthopedics;  Laterality: Right;  . TONSILLECTOMY  as child  . TOTAL KNEE  ARTHROPLASTY Right 12/20/2012   Procedure: RIGHT TOTAL KNEE ARTHROPLASTY;  Surgeon: Gearlean Alf, MD;  Location: [facility];  Service: Orthopedics;  Laterality: Right;  . TOTAL KNEE ARTHROPLASTY Bilateral   . VASECTOMY  1978    There were no vitals filed for this visit.   Subjective Assessment - 02/08/20 1319    Subjective Patient reports exercises are going well. Patient reports [place] to [employer] and did well in the crowd, felt good about his balance. No falls. Reports got a good nights sleep.    Pertinent History melanoma, heart defect, OA, R shoulder replacement, dizziness, HLD, CVA in 2005, R hand surgery, bilat knee TKA in 2014/2016, prostate surgery, tinnitus, hearing loss    Currently in Pain? No/denies                Vestibular Treatment/Exercise - 02/08/20 0001      Vestibular Treatment/Exercise   Vestibular Treatment Provided Gaze    Gaze Exercises X1 Viewing Horizontal;X1 Viewing Vertical      X1 Viewing Horizontal   Foot Position standing feet apart, standing feet together    Reps 2    Comments 60 secs each. no symptoms.      X1 Viewing Vertical   Foot Position standing feet apart, standing feet together    Reps  2    Comments 60 secs each. no symptoms              Balance Exercises - 02/08/20 0001      Balance Exercises: Standing   Stepping Strategy Anterior;Posterior;Foam/compliant surface;Limitations    Stepping Strategy Limitations completed x 10 reps of anterior/posterior stepping strategy off blue balance beam. increased use of hip strategy > ankle. CGA, no UE support    Rockerboard Anterior/posterior;Lateral;Head turns;EC;Intermittent UE support;Limitations    Rockerboard Limitations standing on board A/P completed standing with EO and vertical head turns x 10 reps, then completed EC 3 x 30 seconds. Progressed to board positioned lateral completed EO and horizontal head turns x 10 reps. Progressed to completing EC 2 x 30 seoncds. increased challenge with  board lateral > A/P.     Balance Beam standing across blue balance beam without UE support, completed standing with wide BOS and completed horizontal/vertical head turns x 10 reps each, and completed EC 3 x 30 seconds. intermittent CGA.     Tandem Gait Forward;Foam/compliant surface;4 reps;Limitations    Tandem Gait Limitations completed tandem walking x 1 lap without UE support on firm, progressed to completing on foam surface x 3 laps down and back.     Sidestepping Foam/compliant support;3 reps;Limitations    Sidestepping Limitations completed sidestepping x 3 laps down and back without UE support on blue balance beam.              PT Education - 02/08/20 1401    Education Details VOR x 1 Progression    Person(s) Educated Patient    Methods Explanation;Demonstration;Handout    Comprehension Verbalized understanding;Returned demonstration            PT Short Term Goals - 02/01/20 1456      PT SHORT TERM GOAL #1   Title Pt will participate in assessment of FGA    Baseline 22/30    Time 3    Period Weeks    Status Achieved    Target Date 02/13/20      PT SHORT TERM GOAL #2   Title Pt will initiate balance and vestibular HEP    Baseline HEP initiated    Time 3    Period Weeks    Status Achieved    Target Date 02/13/20             PT Long Term Goals - 02/01/20 1457      PT LONG TERM GOAL #1   Title Pt will demonstrate independence with final vestibular/balance HEP    Time 6    Period Weeks    Status New      PT LONG TERM GOAL #2   Title Pt will increase FOTO score to >/= 77% and will decrease DHI by 10 points    Baseline 69%; DHI: 16    Time 6    Period Weeks    Status New      PT LONG TERM GOAL #3   Title Pt will increase FGA score by 4 points to indicate decreased falls risk    Baseline 22/30    Time 6    Period Weeks    Status New      PT LONG TERM GOAL #4   Title Pt will demonstrate improved use of VOR as indicated by 2-3 line difference on DVA     Baseline 4 line difference (8, 4)    Time 6    Period Weeks    Status New  Plan - 02/08/20 1449    Clinical Impression Statement Today's skilled PT session included continued progression of balance exercises with focus on complaint surfaces and vision removed, patient tolerating well. Increased challenge on rockerboard with it positioned laterally > A/P. Progressed VOR x 1 to standing with feet together, updated HEP for completion at home. Will continue to progress and continue per POC.    Personal Factors and Comorbidities Age;Comorbidity 3+;Past/Current Experience    Comorbidities melanoma, heart defect, OA, R shoulder replacement, dizziness, HLD, CVA in 2005, R hand surgery, bilat knee TKA in 2014/2016, prostate surgery, tinnitus, hearing loss    Examination-Activity Limitations Locomotion Level    Examination-Participation Restrictions Church;Community Activity    Stability/Clinical Decision Making Stable/Uncomplicated    Rehab Potential Good    PT Frequency 1x / week    PT Duration 6 weeks    PT Treatment/Interventions ADLs/Self Care Home Management;Canalith Repostioning;Gait training;Stair training;Functional mobility training;Therapeutic activities;Therapeutic exercise;Balance training;Neuromuscular re-education;Patient/family education;Vestibular    PT Next Visit Plan L Hypofunction. continue standing balance (high level). VOR x 1 on foam    Consulted and Agree with Plan of Care Patient           Patient will benefit from skilled therapeutic intervention in order to improve the following deficits and impairments:  Decreased balance, Difficulty walking, Dizziness  Visit Diagnosis: Dizziness and giddiness  Unsteadiness on feet  Difficulty in walking, not elsewhere classified     Problem List Patient Active Problem List   Diagnosis Date Noted  . 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 Dizziness 10/15/2018  . Malignant melanoma of face  excluding eyelid, nose, lip, and ear ([place]) 10/15/2018  . Arthritis of hand 06/24/2017  . Degenerative joint disease of shoulder region 05/26/2017  . Mucoid cyst, joint 02/27/2016  . 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 Postoperative anemia due to acute blood loss 12/21/2012  . OA (osteoarthritis) of knee 12/20/2012    Jones Bales, PT, DPT 02/08/2020, 2:52 PM  [facility] [address] [place], [place], [address] Phone: [phone]   Fax:  [phone]  Name: Jeffrey Ewing MRN: [id number] Date of Birth: Oct 28, 1940

## 2020-02-15 ENCOUNTER — Other Ambulatory Visit: Payer: Self-pay

## 2020-02-15 ENCOUNTER — Ambulatory Visit: Payer: Medicare Other

## 2020-02-15 DIAGNOSIS — R42 Dizziness and giddiness: Secondary | ICD-10-CM | POA: Diagnosis not present

## 2020-02-15 DIAGNOSIS — R2681 Unsteadiness on feet: Secondary | ICD-10-CM

## 2020-02-15 DIAGNOSIS — R262 Difficulty in walking, not elsewhere classified: Secondary | ICD-10-CM

## 2020-02-15 NOTE — Therapy (Signed)
[facility] [address] [place], [place], [address] Phone: [phone]   Fax:  [phone]  Physical Therapy Treatment  Patient Details  Name: Jeffrey Ewing MRN: [id number] Date of Birth: Aug 07, 1940 Referring Provider (PT): Crist Infante, MD   Encounter Date: 02/15/2020   PT End of Session - 02/15/20 1319    Visit Number 4    Number of Visits 7    Date for PT Re-Evaluation 03/08/20    Authorization Type [employer] Medicare; $35 copay    Progress Note Due on Visit 10    PT Start Time 1317    PT Stop Time 1400    PT Time Calculation (min) 43 min    Equipment Utilized During Treatment Gait belt    Activity Tolerance Patient tolerated treatment well    Behavior During Therapy [facility] for tasks assessed/performed           Past Medical History:  Diagnosis Date  . 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 History of kidney stones   . History of prostate cancer 2008  . Hyperlipidemia   . 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 Melanoma ([place]) 2001   left cheek  . Osteoarthritis    shoulders, knees  . 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 COLONOSCOPY    . COLONOSCOPY W/ POLYPECTOMY  Oct. 2013  . JOINT REPLACEMENT Bilateral   . KNEE ARTHROSCOPY  2009   left  . MASS EXCISION Right 04/03/2016   Procedure: EXCISION cyst right index finger abd debridement proximal interphalangeal;  Surgeon: Daryll Brod, MD;  Location: [facility];  Service: Orthopedics;  Laterality: Right;  FAB  . PROSTATECTOMY  2008  . removal melanoma  2001   left cheek  . REVERSE SHOULDER ARTHROPLASTY Right 03/04/2019   Procedure: REVERSE SHOULDER ARTHROPLASTY;  Surgeon: Netta Cedars, MD;  Location: [facility];  Service: Orthopedics;  Laterality: Right;  . TONSILLECTOMY  as child  . TOTAL KNEE  ARTHROPLASTY Right 12/20/2012   Procedure: RIGHT TOTAL KNEE ARTHROPLASTY;  Surgeon: Gearlean Alf, MD;  Location: [facility];  Service: Orthopedics;  Laterality: Right;  . TOTAL KNEE ARTHROPLASTY Bilateral   . VASECTOMY  1978    There were no vitals filed for this visit.   Subjective Assessment - 02/15/20 1320    Subjective Patient reports that he has been racking leaves and bagging them, and legs feel sore. Reports feels that balance was good when completing the leaves. No falls.    Pertinent History melanoma, heart defect, OA, R shoulder replacement, dizziness, HLD, CVA in 2005, R hand surgery, bilat knee TKA in 2014/2016, prostate surgery, tinnitus, hearing loss    Currently in Pain? No/denies                  [facility] Adult PT Treatment/Exercise - 02/15/20 0001      Ambulation/Gait   Ambulation/Gait Yes    Ambulation/Gait Assistance 5: Supervision    Ambulation/Gait Assistance Details throughout therapy with high level balance    Assistive device None    Gait Pattern Within Functional Limits    Ambulation Surface Level;Indoor      High Level Balance   High Level Balance Activities Backward walking;Head turns;Other (comment)   Gait with Eyes Closed   High  Level Balance Comments Completed the following exercises inlcuding backwards walking 2 x 50', ambulation with eyes closed 2 x 50', and walking with horizontal/vertical head turns 4 x 50', last two reps addition of dual task including counting backwards by 3's. patient demo improvements in balance with completion of activites, require supervision today.       Neuro Re-ed    Neuro Re-ed Details  At countertop, completed ambulation across various height/size colored pebbles, able to complete without UE support, x 3 laps down and back without UE support. PT progressed challenge by addition of dual tasking including counting backwards by 3's, completed x 2 reps. Increased challenge with dual task and increased space between steps. CGA  throughout completion           Vestibular Treatment/Exercise - 02/15/20 0001      Vestibular Treatment/Exercise   Vestibular Treatment Provided Gaze    Gaze Exercises X1 Viewing Horizontal;X1 Viewing Vertical      X1 Viewing Horizontal   Foot Position feet together (firm); feet apart (complaint surface)     Reps 3    Comments 60 secs each, no dizziness increased balance challenge on complaint      X1 Viewing Vertical   Foot Position feet together (firm); feet apart (complaint surface)     Reps 2    Comments 60 secs each, no dizziness increased balance challenge on complaint              Balance Exercises - 02/15/20 0001      Balance Exercises: Standing   Standing Eyes Closed Wide (BOA);Head turns;Foam/compliant surface;3 reps;30 secs;Limitations    Standing Eyes Closed Limitations completed 3 x 30 seconds with EC on airex with wide BOS. Progressed to complete horizontal/vertical head turns x 10 reps. increased balance challenge with vertical > horizontal.     Tandem Stance Eyes open;Foam/compliant surface;Limitations    Tandem Stance Time completed standing on blue mat on incline, completed static for 30 seconds, progressing to horizontal head turns x 10 reps, alternting feet position.     Other Standing Exercises Standing on upward incline: completed marching with EO x 10 steps, EC x 10 steps. Then progressed to completing marching with EO and addition of horiozntal/vertical head turns on firm surface x 1 minute, then progressed to completing on complaint surfaces x 1 minute. Increased challenge on complaint surfaces and addition of head turns               PT Short Term Goals - 02/01/20 1456      PT SHORT TERM GOAL #1   Title Pt will participate in assessment of FGA    Baseline 22/30    Time 3    Period Weeks    Status Achieved    Target Date 02/13/20      PT SHORT TERM GOAL #2   Title Pt will initiate balance and vestibular HEP    Baseline HEP initiated     Time 3    Period Weeks    Status Achieved    Target Date 02/13/20             PT Long Term Goals - 02/01/20 1457      PT LONG TERM GOAL #1   Title Pt will demonstrate independence with final vestibular/balance HEP    Time 6    Period Weeks    Status New      PT LONG TERM GOAL #2   Title Pt will increase FOTO score to >/=  77% and will decrease DHI by 10 points    Baseline 69%; DHI: 16    Time 6    Period Weeks    Status New      PT LONG TERM GOAL #3   Title Pt will increase FGA score by 4 points to indicate decreased falls risk    Baseline 22/30    Time 6    Period Weeks    Status New      PT LONG TERM GOAL #4   Title Pt will demonstrate improved use of VOR as indicated by 2-3 line difference on DVA    Baseline 4 line difference (8, 4)    Time 6    Period Weeks    Status New                 Plan - 02/15/20 1616    Clinical Impression Statement Continued progression of VOR x 1 onto complaint surfaces with patient tolerating well. Continued progression of balance exercises working on incorporating dual task into high level balance with patient tolerating well. Demonstating overall improvements in balance. Patient will continue to benefit from skilled PT services.    Personal Factors and Comorbidities Age;Comorbidity 3+;Past/Current Experience    Comorbidities melanoma, heart defect, OA, R shoulder replacement, dizziness, HLD, CVA in 2005, R hand surgery, bilat knee TKA in 2014/2016, prostate surgery, tinnitus, hearing loss    Examination-Activity Limitations Locomotion Level    Examination-Participation Restrictions Church;Community Activity    Stability/Clinical Decision Making Stable/Uncomplicated    Rehab Potential Good    PT Frequency 1x / week    PT Duration 6 weeks    PT Treatment/Interventions ADLs/Self Care Home Management;Canalith Repostioning;Gait training;Stair training;Functional mobility training;Therapeutic activities;Therapeutic exercise;Balance  training;Neuromuscular re-education;Patient/family education;Vestibular    PT Next Visit Plan Check Goals. Advance HEP. Progress VOR x 1 onto foam    Consulted and Agree with Plan of Care Patient           Patient will benefit from skilled therapeutic intervention in order to improve the following deficits and impairments:  Decreased balance, Difficulty walking, Dizziness  Visit Diagnosis: Dizziness and giddiness  Unsteadiness on feet  Difficulty in walking, not elsewhere classified     Problem List Patient Active Problem List   Diagnosis Date Noted  . 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 Dizziness 10/15/2018  . Malignant melanoma of face excluding eyelid, nose, lip, and ear ([place]) 10/15/2018  . Arthritis of hand 06/24/2017  . Degenerative joint disease of shoulder region 05/26/2017  . Mucoid cyst, joint 02/27/2016  . 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 Postoperative anemia due to acute blood loss 12/21/2012  . OA (osteoarthritis) of knee 12/20/2012    Jones Bales, PT, DPT 02/15/2020, 4:18 PM  [facility] [address] [place], [place], [address] Phone: [phone]   Fax:  [phone]  Name: Jeffrey Ewing MRN: [id number] Date of Birth: November 21, 1940

## 2020-02-29 ENCOUNTER — Ambulatory Visit: Payer: Medicare Other

## 2020-04-06 ENCOUNTER — Other Ambulatory Visit: Payer: Self-pay

## 2020-04-06 ENCOUNTER — Ambulatory Visit: Payer: Medicare Other | Attending: Internal Medicine

## 2020-04-06 DIAGNOSIS — R2681 Unsteadiness on feet: Secondary | ICD-10-CM | POA: Diagnosis present

## 2020-04-06 DIAGNOSIS — R262 Difficulty in walking, not elsewhere classified: Secondary | ICD-10-CM | POA: Insufficient documentation

## 2020-04-06 DIAGNOSIS — R42 Dizziness and giddiness: Secondary | ICD-10-CM | POA: Diagnosis not present

## 2020-04-06 NOTE — Therapy (Signed)
[facility] [address] [place], [place], [address] Phone: [phone]   Fax:  [phone]  Physical Therapy Treatment/Discharge Summary  Patient Details  Name: Jeffrey Ewing MRN: [id number] Date of Birth: 1940-04-22 Referring Provider (PT): Crist Infante, MD  PHYSICAL THERAPY DISCHARGE SUMMARY  Visits from Start of Care: 5  Current functional level related to goals / functional outcomes: See Clinical Impression Statement for Details   Remaining deficits: Low Fall Risk   Education / Equipment: Vestibular/Balance HEP  Plan: Patient agrees to discharge.  Patient goals were met. Patient is being discharged due to meeting the stated rehab goals.  ?????           Encounter Date: 04/06/2020   PT End of Session - 04/06/20 1310    Visit Number 5    Number of Visits 7    Date for PT Re-Evaluation 03/08/20    Authorization Type [employer] Medicare; $35 copay    Progress Note Due on Visit 10    PT Start Time 1310    PT Stop Time 1340    PT Time Calculation (min) 30 min    Equipment Utilized During Treatment Gait belt    Activity Tolerance Patient tolerated treatment well    Behavior During Therapy WFL for tasks assessed/performed           Past Medical History:  Diagnosis Date  . 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 History of kidney stones   . History of prostate cancer 2008  . Hyperlipidemia   . 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 Melanoma ([place]) 2001   left cheek  . Osteoarthritis    shoulders, knees  . 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 COLONOSCOPY    . COLONOSCOPY W/ POLYPECTOMY  Oct. 2013  . JOINT REPLACEMENT Bilateral   . KNEE ARTHROSCOPY  2009   left  . MASS EXCISION Right 04/03/2016   Procedure: EXCISION cyst right index finger abd  debridement proximal interphalangeal;  Surgeon: Daryll Brod, MD;  Location: [facility];  Service: Orthopedics;  Laterality: Right;  FAB  . PROSTATECTOMY  2008  . removal melanoma  2001   left cheek  . REVERSE SHOULDER ARTHROPLASTY Right 03/04/2019   Procedure: REVERSE SHOULDER ARTHROPLASTY;  Surgeon: Netta Cedars, MD;  Location: [facility];  Service: Orthopedics;  Laterality: Right;  . TONSILLECTOMY  as child  . TOTAL KNEE ARTHROPLASTY Right 12/20/2012   Procedure: RIGHT TOTAL KNEE ARTHROPLASTY;  Surgeon: Gearlean Alf, MD;  Location: [facility];  Service: Orthopedics;  Laterality: Right;  . TOTAL KNEE ARTHROPLASTY Bilateral   . VASECTOMY  1978    There were no vitals filed for this visit.   Subjective Assessment - 04/06/20 1311    Subjective Patient reports doing well. Reports episodes come/go. Patient reports no falls or new complaints since last visit. Patient reports that exercises are going well.    Pertinent History melanoma, heart defect, OA, R shoulder replacement, dizziness, HLD, CVA in 2005, R hand surgery, bilat knee TKA in 2014/2016, prostate surgery, tinnitus, hearing loss    Currently in Pain? No/denies              [facility] PT Assessment - 04/06/20 0001      Observation/Other Assessments  Focus on Therapeutic Outcomes (FOTO)  93%    Other Surveys  Dizziness Handicap Inventory (DHI)    Dizziness Handicap Inventory [facility])  8      Functional Gait  Assessment   Gait assessed  Yes    Gait Level Surface Walks 20 ft in less than 7 sec but greater than 5.5 sec, uses assistive device, slower speed, mild gait deviations, or deviates 6-10 in outside of the 12 in walkway width.    Change in Gait Speed Able to smoothly change walking speed without loss of balance or gait deviation. Deviate no more than 6 in outside of the 12 in walkway width.    Gait with Horizontal Head Turns Performs head turns smoothly with no change in gait. Deviates no more than 6 in outside 12 in  walkway width    Gait with Vertical Head Turns Performs head turns with no change in gait. Deviates no more than 6 in outside 12 in walkway width.    Gait and Pivot Turn Pivot turns safely within 3 sec and stops quickly with no loss of balance.    Step Over Obstacle Is able to step over 2 stacked shoe boxes taped together (9 in total height) without changing gait speed. No evidence of imbalance.    Gait with Narrow Base of Support Is able to ambulate for 10 steps heel to toe with no staggering.    Gait with Eyes Closed Walks 20 ft, no assistive devices, good speed, no evidence of imbalance, normal gait pattern, deviates no more than 6 in outside 12 in walkway width. Ambulates 20 ft in less than 7 sec.    Ambulating Backwards Walks 20 ft, uses assistive device, slower speed, mild gait deviations, deviates 6-10 in outside 12 in walkway width.    Steps Alternating feet, no rail.    Total Score 28    FGA comment: 28/30 = Low Fall Risk               Vestibular Assessment - 04/06/20 0001      Visual Acuity   Static 8    Dynamic 6            Completed Review of HEP:  Gaze Stabilization: Standing Feet Together    Feet together, keeping eyes on target on wall 3-4 feet away, tilt head down 15-30 and move head side to side for 60 seconds. Repeat while moving head up and down for 60 seconds. Do 3-4 sessions per day. Repeat using target on pattern background.     Access Code: [email or web address] URL: [email or web address] Date: 02/01/2020 Prepared by: Baldomero Lamy  Exercises Standing with Feet Apart and Eyes Closed on Pillow - 1 x daily - 5 x weekly - 1 sets - 3 reps - 30 hold Standing with Head Rotation on Pillow - 1 x daily - 5 x weekly - 2 sets - 10 reps Standing with Head Nod on Pillow - 1 x daily - 5 x weekly - 2 sets - 10 reps    [facility] Adult PT Treatment/Exercise - 04/06/20 0001      Ambulation/Gait   Ambulation/Gait Yes    Ambulation/Gait Assistance 5:  Supervision    Ambulation/Gait Assistance Details throughout therapy gym with activities    Assistive device None    Gait Pattern Within Functional Limits    Ambulation Surface Level;Indoor              PT Short Term Goals - 02/01/20 1456  PT SHORT TERM GOAL #1   Title Pt will participate in assessment of FGA    Baseline 22/30    Time 3    Period Weeks    Status Achieved    Target Date 02/13/20      PT SHORT TERM GOAL #2   Title Pt will initiate balance and vestibular HEP    Baseline HEP initiated    Time 3    Period Weeks    Status Achieved    Target Date 02/13/20             PT Long Term Goals - 04/06/20 1326      PT LONG TERM GOAL #1   Title Pt will demonstrate independence with final vestibular/balance HEP    Baseline patient reports independence completing daily    Time 6    Period Weeks    Status Achieved      PT LONG TERM GOAL #2   Title Pt will increase FOTO score to >/= 77% and will decrease DHI by 10 points    Baseline 69%; DHI: 16; 93% DHI: 8    Time 6    Period Weeks    Status Achieved      PT LONG TERM GOAL #3   Title Pt will increase FGA score by 4 points to indicate decreased falls risk    Baseline 28/30    Time 6    Period Weeks    Status Achieved      PT LONG TERM GOAL #4   Title Pt will demonstrate improved use of VOR as indicated by 2-3 line difference on DVA    Baseline 2 line difference    Time 6    Period Weeks    Status Achieved                 Plan - 04/06/20 1343    Clinical Impression Statement Today's skilled PT session included assesment of patient's progress toward all LTGs. Patient able to meet all LTG today during session demonstrating improved VOR function, improved balance, and reduced fall risk. patient scored 28/30 on FGA demonstrating low fall risk and improved balance with PT services. Patient scoring 93% on FOTO and 8 on [place] demosntrating improved function and decreased dizziness. patient  demosntrating readiness for discharge, with patient vebralizing agreement.    Personal Factors and Comorbidities Age;Comorbidity 3+;Past/Current Experience    Comorbidities melanoma, heart defect, OA, R shoulder replacement, dizziness, HLD, CVA in 2005, R hand surgery, bilat knee TKA in 2014/2016, prostate surgery, tinnitus, hearing loss    Examination-Activity Limitations Locomotion Level    Examination-Participation Restrictions Church;Community Activity    Stability/Clinical Decision Making Stable/Uncomplicated    Rehab Potential Good    PT Frequency 1x / week    PT Duration 6 weeks    PT Treatment/Interventions ADLs/Self Care Home Management;Canalith Repostioning;Gait training;Stair training;Functional mobility training;Therapeutic activities;Therapeutic exercise;Balance training;Neuromuscular re-education;Patient/family education;Vestibular    Consulted and Agree with Plan of Care Patient           Patient will benefit from skilled therapeutic intervention in order to improve the following deficits and impairments:  Decreased balance,Difficulty walking,Dizziness  Visit Diagnosis: Dizziness and giddiness  Unsteadiness on feet  Difficulty in walking, not elsewhere classified     Problem List Patient Active Problem List   Diagnosis Date Noted  . 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 Dizziness 10/15/2018  . Malignant melanoma of face excluding eyelid, nose, lip, and ear ([place]) 10/15/2018  . Arthritis of  hand 06/24/2017  . Degenerative joint disease of shoulder region 05/26/2017  . Mucoid cyst, joint 02/27/2016  . 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 Postoperative anemia due to acute blood loss 12/21/2012  . OA (osteoarthritis) of knee 12/20/2012    Jones Bales, PT, DPT 04/06/2020, 1:45 PM  [facility] [address] [place], [place], [address] Phone: [phone]   Fax:   [phone]  Name: ROBET CRUTCHFIELD MRN: [id number] Date of Birth: [birth year]

## 2020-04-11 ENCOUNTER — Ambulatory Visit: Payer: Medicare Other

## 2020-04-18 ENCOUNTER — Ambulatory Visit: Payer: Medicare Other

## 2020-04-19 ENCOUNTER — Emergency Department (HOSPITAL_COMMUNITY)
Admission: EM | Admit: 2020-04-19 | Discharge: 2020-04-19 | Disposition: A | Discharge status: Home / Self Care | Payer: No Typology Code available for payment source | Attending: Emergency Medicine | Admitting: Emergency Medicine

## 2020-04-19 ENCOUNTER — Other Ambulatory Visit: Payer: Self-pay

## 2020-04-19 ENCOUNTER — Encounter (HOSPITAL_COMMUNITY): Payer: Self-pay

## 2020-04-19 ENCOUNTER — Emergency Department (HOSPITAL_COMMUNITY): Payer: No Typology Code available for payment source

## 2020-04-19 DIAGNOSIS — Z7982 Long term (current) use of aspirin: Secondary | ICD-10-CM | POA: Insufficient documentation

## 2020-04-19 DIAGNOSIS — I701 Atherosclerosis of renal artery: Secondary | ICD-10-CM

## 2020-04-19 DIAGNOSIS — Z8546 Personal history of malignant neoplasm of prostate: Secondary | ICD-10-CM | POA: Insufficient documentation

## 2020-04-19 DIAGNOSIS — Z87891 Personal history of nicotine dependence: Secondary | ICD-10-CM | POA: Diagnosis not present

## 2020-04-19 DIAGNOSIS — I700 Atherosclerosis of aorta: Secondary | ICD-10-CM | POA: Diagnosis not present

## 2020-04-19 DIAGNOSIS — Z20822 Contact with and (suspected) exposure to covid-19: Secondary | ICD-10-CM | POA: Insufficient documentation

## 2020-04-19 DIAGNOSIS — Z85820 Personal history of malignant melanoma of skin: Secondary | ICD-10-CM | POA: Insufficient documentation

## 2020-04-19 DIAGNOSIS — Z96653 Presence of artificial knee joint, bilateral: Secondary | ICD-10-CM | POA: Diagnosis not present

## 2020-04-19 DIAGNOSIS — R109 Unspecified abdominal pain: Secondary | ICD-10-CM | POA: Diagnosis present

## 2020-04-19 LAB — CBC
HCT: 41.4 % (ref 39.0–52.0)
Hemoglobin: 14.6 g/dL (ref 13.0–17.0)
MCH: 37.3 pg — ABNORMAL HIGH (ref 26.0–34.0)
MCHC: 35.3 g/dL (ref 30.0–36.0)
MCV: 105.9 fL — ABNORMAL HIGH (ref 80.0–100.0)
Platelets: UNDETERMINED 10*3/uL (ref 150–400)
RBC: 3.91 MIL/uL — ABNORMAL LOW (ref 4.22–5.81)
RDW: 13.2 % (ref 11.5–15.5)
WBC: 7.3 10*3/uL (ref 4.0–10.5)
nRBC: 0 % (ref 0.0–0.2)

## 2020-04-19 LAB — URINALYSIS, ROUTINE W REFLEX MICROSCOPIC
Bilirubin Urine: NEGATIVE
Glucose, UA: NEGATIVE mg/dL
Hgb urine dipstick: NEGATIVE
Ketones, ur: NEGATIVE mg/dL
Leukocytes,Ua: NEGATIVE
Nitrite: NEGATIVE
Protein, ur: NEGATIVE mg/dL
Specific Gravity, Urine: 1.017 (ref 1.005–1.030)
pH: 6 (ref 5.0–8.0)

## 2020-04-19 LAB — I-STAT CHEM 8, ED
BUN: 12 mg/dL (ref 8–23)
Calcium, Ion: 1.18 mmol/L (ref 1.15–1.40)
Chloride: 100 mmol/L (ref 98–111)
Creatinine, Ser: 0.9 mg/dL (ref 0.61–1.24)
Glucose, Bld: 130 mg/dL — ABNORMAL HIGH (ref 70–99)
HCT: 42 % (ref 39.0–52.0)
Hemoglobin: 14.3 g/dL (ref 13.0–17.0)
Potassium: 4 mmol/L (ref 3.5–5.1)
Sodium: 138 mmol/L (ref 135–145)
TCO2: 29 mmol/L (ref 22–32)

## 2020-04-19 LAB — SARS CORONAVIRUS 2 BY RT PCR (HOSPITAL ORDER, PERFORMED IN ~~LOC~~ HOSPITAL LAB): SARS Coronavirus 2: NEGATIVE

## 2020-04-19 LAB — COMPREHENSIVE METABOLIC PANEL
ALT: 20 U/L (ref 0–44)
AST: 27 U/L (ref 15–41)
Albumin: 4.4 g/dL (ref 3.5–5.0)
Alkaline Phosphatase: 75 U/L (ref 38–126)
Anion gap: 10 (ref 5–15)
BUN: 10 mg/dL (ref 8–23)
CO2: 27 mmol/L (ref 22–32)
Calcium: 9.4 mg/dL (ref 8.9–10.3)
Chloride: 103 mmol/L (ref 98–111)
Creatinine, Ser: 0.92 mg/dL (ref 0.61–1.24)
GFR, Estimated: >60 mL/min (ref >60)
Glucose, Bld: 134 mg/dL — ABNORMAL HIGH (ref 70–99)
Potassium: 4 mmol/L (ref 3.5–5.1)
Sodium: 140 mmol/L (ref 135–145)
Total Bilirubin: 1 mg/dL (ref 0.3–1.2)
Total Protein: 6.5 g/dL (ref 6.5–8.1)

## 2020-04-19 LAB — LIPASE, BLOOD: Lipase: 35 U/L (ref 11–51)

## 2020-04-19 MED ORDER — IOHEXOL 350 MG/ML SOLN
100.0000 mL | Freq: Once | INTRAVENOUS | Status: AC | PRN
  Administered 2020-04-19: 100 mL via INTRAVENOUS

## 2020-04-19 NOTE — Discharge Instructions (Signed)
Please read and follow all provided instructions.  Your diagnoses today include:  1. Abdominal aortic atherosclerosis ([facility])   2. Renal artery stenosis ([facility])     Tests performed today include: Blood cell counts (white, red, and platelets) Electrolytes  Kidney function test - normal kidney function Urine test to check for infection CT scan of the abdomen -shows mild atherosclerosis as we discussed in your aorta and a more significant narrowing in the right renal artery  Vital signs. See below for your results today.   Medications prescribed:   None  Take any prescribed medications only as directed.  Home care instructions:  Follow any educational materials contained in this packet.  Follow-up instructions: Please follow-up with your primary care provider and the vascular surgery referral in the next week.  Return instructions:   Please return to the Emergency Department if you experience worsening symptoms.   Return if you develop acute severe pain or color changes in either of your legs  Please return if you have any other emergent concerns.  Additional Information:  Your vital signs today were: BP (!) 160/79    Pulse (!) 105    Temp 98.3 F (36.8 C) (Oral)    Resp 18    Ht 5\' 10"  (1.778 m)    Wt 87.1 kg    SpO2 96%    BMI 27.55 kg/m  If your blood pressure (BP) was elevated above 135/85 this visit, please have this repeated by your doctor within one month. --------------

## 2020-04-19 NOTE — ED Provider Notes (Signed)
[facility] EMERGENCY DEPARTMENT Provider Note   CSN: [id number] Arrival date & time: 04/19/20  1025     History Chief Complaint  Patient presents with  . Abdominal Pain    Jeffrey Ewing is a 80 y.o. male.  Patient presents to the emergency department today for evaluation of abnormal aortic ultrasound.  Patient was having a standard screening ultrasound of the aorta today to look for aneurysm.  He was not having any preceding abdominal pain.  Patient was told after this exam was performed to come to the emergency department because of the possibility of a clot in the aorta.  Patient states that he will sometimes get a "raw feeling" on the bottom of his feet, however this is chronic and he has not had any recent changes in sensation of his feet.  No color changes although sometimes his feet feel cold.  No significant pain in his lower extremities.  He states that his symptoms are improved when he exercises and walks, and denies claudication symptoms.  No chest pain or shortness of breath.  He is on Aggrenox.  He has a remote smoking history, has not smoked in 20 years.  He is on medication for high cholesterol.        Past Medical History:  Diagnosis Date  . 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 History of kidney stones   . History of prostate cancer 2008  . Hyperlipidemia   . Lentigo maligna ([place]) 04/23/1999   Left Jawline  . Melanoma ([place]) 2001   left cheek  . Osteoarthritis    shoulders, knees  . 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 Dizziness 10/15/2018  . Malignant melanoma of face excluding eyelid, nose, lip, and ear ([place]) 10/15/2018  . Arthritis of hand 06/24/2017  . Degenerative joint disease of shoulder region 05/26/2017  . Mucoid cyst, joint 02/27/2016  .  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 Postoperative anemia due to acute blood loss 12/21/2012  . OA (osteoarthritis) of knee 12/20/2012    Past Surgical History:  Procedure Laterality Date  . COLONOSCOPY    . COLONOSCOPY W/ POLYPECTOMY  Oct. 2013  . JOINT REPLACEMENT Bilateral   . KNEE ARTHROSCOPY  2009   left  . MASS EXCISION Right 04/03/2016   Procedure: EXCISION cyst right index finger abd debridement proximal interphalangeal;  Surgeon: Daryll Brod, MD;  Location: [facility];  Service: Orthopedics;  Laterality: Right;  FAB  . PROSTATECTOMY  2008  . removal melanoma  2001   left cheek  . REVERSE SHOULDER ARTHROPLASTY Right 03/04/2019   Procedure: REVERSE SHOULDER ARTHROPLASTY;  Surgeon: Netta Cedars, MD;  [facility];  Service: Orthopedics;  Laterality: Right;  . TONSILLECTOMY  as child  . TOTAL KNEE ARTHROPLASTY Right 12/20/2012   Procedure: RIGHT TOTAL KNEE ARTHROPLASTY;  Surgeon: Gearlean Alf, MD;  [facility];  Service: Orthopedics;  Laterality: Right;  . TOTAL KNEE ARTHROPLASTY Bilateral   . VASECTOMY  1978       Family History  Problem Relation Age of Onset  . Alzheimer's disease Mother   . Heart attack Father   . Hypertension Father   . CAD Father   . Colon cancer Neg Hx   .  Stomach cancer Neg Hx   . Esophageal cancer Neg Hx   . Rectal cancer Neg Hx     Social History   Tobacco Use  . Smoking status: Former Smoker    Packs/day: 1.00    Years: 41.00    Pack years: 41.00    Types: Cigarettes    Quit date: 11/05/2003    Years since quitting: 16.4  . Smokeless tobacco: Never Used  Vaping Use  . Vaping Use: Never used  Substance Use Topics  . Alcohol use: No  . Drug use: No    Home Medications Prior to Admission medications   Medication Sig Start Date End Date Taking? Authorizing Provider  CRESTOR 20 MG tablet Take 20 mg by mouth daily.  10/03/11   [provider]  dipyridamole-aspirin (AGGRENOX) 200-25 MG  per 12 hr capsule Take 1 capsule by mouth 2 (two) times daily.    [provider]  ezetimibe (ZETIA) 10 MG tablet TAKE 1 TABLET(10 MG) BY MOUTH DAILY 11/02/19   Adrian Prows, MD  meclizine (ANTIVERT) 25 MG tablet Take 25 mg by mouth 3 (three) times daily as needed for dizziness. Patient not taking: Reported on 01/23/2020    [provider]    Allergies    Patient has no known allergies.  Review of Systems   Review of Systems  Constitutional: Negative for fever.  HENT: Negative for rhinorrhea and sore throat.   Eyes: Negative for redness.  Respiratory: Negative for cough.   Cardiovascular: Negative for chest pain.  Gastrointestinal: Negative for abdominal pain, diarrhea, nausea and vomiting.  Genitourinary: Negative for dysuria and hematuria.  Musculoskeletal: Negative for myalgias.  Skin: Negative for rash.  Neurological: Positive for numbness (tingling in feet intermittently). Negative for dizziness (intermittent chronic) and headaches.    Physical Exam Updated Vital Signs BP (!) 160/79   Pulse (!) 105   Temp 98.3 F (36.8 C) (Oral)   Resp 18   Ht 5\' 10"  (1.778 m)   Wt 87.1 kg   SpO2 96%   BMI 27.55 kg/m   Physical Exam Vitals and nursing note reviewed.  Constitutional:      Appearance: He is well-developed and well-nourished. He is not diaphoretic.  HENT:     Head: Normocephalic and atraumatic.     Mouth/Throat:     Mouth: Mucous membranes are normal. Mucous membranes are not dry.  Eyes:     Conjunctiva/sclera: Conjunctivae normal.  Neck:     Vascular: Normal carotid pulses. No carotid bruit or JVD.     Trachea: Trachea normal. No tracheal deviation.  Cardiovascular:     Rate and Rhythm: Normal rate and regular rhythm.     Pulses: Intact distal pulses. No decreased pulses.          Dorsalis pedis pulses are 2+ on the right side and 2+ on the left side.     Heart sounds: S1 normal and S2 normal. Heart sounds not distant.  Pulmonary:     Effort:  Pulmonary effort is normal. No respiratory distress.     Breath sounds: Normal breath sounds. No wheezing.  Chest:     Chest wall: No tenderness.  Abdominal:     General: Bowel sounds are normal. Aorta is normal.     Palpations: Abdomen is soft.     Tenderness: There is no abdominal tenderness. There is no guarding or rebound.  Musculoskeletal:        General: No edema.     Cervical back: Normal  range of motion and neck supple. No muscular tenderness.     Right lower leg: No edema.     Left lower leg: No edema.  Skin:    General: Skin is warm and dry.     Coloration: Skin is not pale.     Nails: There is no cyanosis.     Comments: Bilateral feet appear well-perfused without pallor or cyanosis.  Neurological:     Mental Status: He is alert.  Psychiatric:        Mood and Affect: Mood and affect normal.     ED Results / Procedures / Treatments   Labs (all labs ordered are listed, but only abnormal results are displayed) Labs Reviewed  COMPREHENSIVE METABOLIC PANEL - Abnormal; Notable for the following components:      Result Value   Glucose, Bld 134 (*)    All other components within normal limits  URINALYSIS, ROUTINE W REFLEX MICROSCOPIC - Abnormal; Notable for the following components:   APPearance HAZY (*)    All other components within normal limits  I-STAT CHEM 8, ED - Abnormal; Notable for the following components:   Glucose, Bld 130 (*)    All other components within normal limits  SARS CORONAVIRUS 2 BY RT PCR (HOSPITAL ORDER, [facility])  LIPASE, BLOOD  CBC    EKG None  Radiology CT Angio Abdomen W and/or Wo Contrast  Result Date: 04/19/2020 CLINICAL DATA:  Abdominal mass. EXAM: CT ANGIOGRAPHY ABDOMEN TECHNIQUE: Multidetector CT imaging of the abdomen was performed using the standard protocol during bolus administration of intravenous contrast. Multiplanar reconstructed images and MIPs were obtained and reviewed to evaluate the  vascular anatomy. CONTRAST:  165mL OMNIPAQUE IOHEXOL 350 MG/ML SOLN COMPARISON:  June 18, 2018. FINDINGS: VASCULAR Aorta: Atherosclerosis of abdominal aorta is noted without aneurysm or acute dissection. Celiac: Patent without evidence of aneurysm, dissection, vasculitis or significant stenosis. SMA: Mild stenosis is noted at origin secondary to calcified plaque. No thrombus is noted. Renals: Left renal artery is widely patent. Severe stenosis is noted at origin of right renal artery secondary to eccentric calcified plaque. No thrombus is noted. IMA: Patent without evidence of aneurysm, dissection, vasculitis or significant stenosis. Inflow: Patent without evidence of aneurysm, dissection, vasculitis or significant stenosis. Veins: No obvious venous abnormality within the limitations of this arterial phase study. Review of the MIP images confirms the above findings. NON-VASCULAR Lower chest: No acute abnormality. Hepatobiliary: No focal liver abnormality is seen. No gallstones, gallbladder wall thickening, or biliary dilatation. Pancreas: Unremarkable. No pancreatic ductal dilatation or surrounding inflammatory changes. Spleen: Normal in size without focal abnormality. Adrenals/Urinary Tract: Adrenal glands are unremarkable. Kidneys are normal, without renal calculi, focal lesion, or hydronephrosis. Bladder is unremarkable. Stomach/Bowel: The stomach appears normal. There is no evidence of bowel obstruction or inflammation. The appendix appears normal. Sigmoid diverticulosis is noted without inflammation. Lymphatic: No adenopathy is noted. Other: No abdominal wall hernia or abnormality. No abdominopelvic ascites. 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 No acute abnormality is noted. IMPRESSION: VASCULAR: Atherosclerosis of abdominal aorta is noted without aneurysm or acute dissection. Severe stenosis is noted at origin of right renal artery secondary to eccentric  calcified plaque. No thrombus is noted. NON-VASCULAR: Sigmoid diverticulosis without inflammation. 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 Aortic Atherosclerosis (ICD10-I70.0). Electronically Signed   By: Marijo Conception M.D.   On: 04/19/2020 12:36    Procedures Procedures (including critical care time)  Medications Ordered in ED Medications  iohexol (OMNIPAQUE) 350 MG/ML injection 100 mL (100 mLs Intravenous Contrast Given 04/19/20 1216)    ED Course  I have reviewed the triage vital signs and the nursing notes.  Pertinent labs & imaging results that were available during my care of the patient were reviewed by me and considered in my medical decision making (see chart for details).  Patient seen and examined.  Reviewed CT imaging.  Patient with atherosclerosis and I suspect that this is what they were seeing on abdominal ultrasound.  He does have a more substantial right renal artery stenosis due to atherosclerosis, however normal kidney function today and blood pressure is improved.  No indication for emergent intervention.  Patient be given referral for vascular surgery and asked to follow-up with his primary care doctor.  Discussed results with patient and wife.  We discussed importance of continued risk factor mitigation.  Patient verbalizes understanding agrees with plan.  Discussed with Dr. Alvino Chapel, agrees with discharge.   Vital signs reviewed and are as follows: BP (!) 160/79   Pulse (!) 105   Temp 98.3 F (36.8 C) (Oral)   Resp 18   Ht 5\' 10"  (1.778 m)   Wt 87.1 kg   SpO2 96%   BMI 27.55 kg/m     MDM Rules/Calculators/A&P                          Patient sent over concern of potential aortic thrombus noted on screening aortic ultrasound today.  Fortunately, on CT angiography no thrombus is seen.  Patient does have scattered areas of atherosclerosis and a more significant renal artery stenosis on the right due to atherosclerosis.  Creatinine  is normal and blood pressure is reasonably controlled today.  No indication for emergent vascular surgery evaluation.  Patient has appropriate PCP follow-up.  He is on Aggrenox.  Plan for outpatient follow-up, return if worsening.  Lower extremities are well-perfused without signs of claudication or acute arterial insufficiency.    Final Clinical Impression(s) / ED Diagnoses Final diagnoses:  Abdominal aortic atherosclerosis ([place])  Renal artery stenosis [facility])    Rx / DC Orders ED Discharge Orders    None       Carlisle Cater, Hershal Coria 04/19/20 1319    Davonna Belling, MD 04/19/20 1626

## 2020-04-19 NOTE — ED Triage Notes (Addendum)
Pt ambulated from abd scan at the [place] [facility]. Pt was told to come to the ED due to distal aortic Thrombus. Pt was sent here for STAT CTA. Per [facility] negative AAA.

## 2020-04-24 ENCOUNTER — Other Ambulatory Visit: Payer: Self-pay | Admitting: Cardiology

## 2020-04-25 ENCOUNTER — Ambulatory Visit: Payer: Medicare Other

## 2020-05-11 ENCOUNTER — Other Ambulatory Visit: Payer: Self-pay | Admitting: Internal Medicine

## 2020-05-11 ENCOUNTER — Encounter: Payer: Self-pay | Admitting: Gastroenterology

## 2020-05-11 DIAGNOSIS — R42 Dizziness and giddiness: Secondary | ICD-10-CM

## 2020-05-15 ENCOUNTER — Other Ambulatory Visit: Payer: Self-pay

## 2020-05-15 ENCOUNTER — Ambulatory Visit
Admission: RE | Admit: 2020-05-15 | Discharge: 2020-05-15 | Disposition: A | Discharge status: Home / Self Care | Payer: Medicare Other | Source: Ambulatory Visit | Attending: Internal Medicine | Admitting: Internal Medicine

## 2020-05-15 ENCOUNTER — Ambulatory Visit
Admission: RE | Admit: 2020-05-15 | Discharge: 2020-05-15 | Disposition: A | Discharge status: Home / Self Care | Payer: No Typology Code available for payment source | Source: Ambulatory Visit | Attending: Internal Medicine | Admitting: Internal Medicine

## 2020-05-15 DIAGNOSIS — Z96611 Presence of right artificial shoulder joint: Secondary | ICD-10-CM | POA: Insufficient documentation

## 2020-05-15 DIAGNOSIS — H9319 Tinnitus, unspecified ear: Secondary | ICD-10-CM | POA: Insufficient documentation

## 2020-05-15 DIAGNOSIS — H9190 Unspecified hearing loss, unspecified ear: Secondary | ICD-10-CM | POA: Insufficient documentation

## 2020-05-15 DIAGNOSIS — R42 Dizziness and giddiness: Secondary | ICD-10-CM

## 2020-05-15 DIAGNOSIS — C61 Malignant neoplasm of prostate: Secondary | ICD-10-CM | POA: Insufficient documentation

## 2020-05-15 DIAGNOSIS — I701 Atherosclerosis of renal artery: Secondary | ICD-10-CM | POA: Insufficient documentation

## 2020-05-15 DIAGNOSIS — Z96653 Presence of artificial knee joint, bilateral: Secondary | ICD-10-CM | POA: Insufficient documentation

## 2020-05-15 DIAGNOSIS — Z86010 Personal history of colonic polyps: Secondary | ICD-10-CM | POA: Insufficient documentation

## 2020-05-15 DIAGNOSIS — I739 Peripheral vascular disease, unspecified: Secondary | ICD-10-CM

## 2020-05-15 MED ORDER — GADOBENATE DIMEGLUMINE 529 MG/ML IV SOLN
18.0000 mL | Freq: Once | INTRAVENOUS | Status: AC | PRN
  Administered 2020-05-15: 18 mL via INTRAVENOUS

## 2020-05-18 ENCOUNTER — Other Ambulatory Visit: Payer: No Typology Code available for payment source

## 2020-06-01 ENCOUNTER — Ambulatory Visit (HOSPITAL_COMMUNITY)
Admission: RE | Admit: 2020-06-01 | Discharge: 2020-06-01 | Disposition: A | Discharge status: Home / Self Care | Payer: Medicare Other | Source: Ambulatory Visit | Attending: Vascular Surgery | Admitting: Vascular Surgery

## 2020-06-01 ENCOUNTER — Ambulatory Visit (INDEPENDENT_AMBULATORY_CARE_PROVIDER_SITE_OTHER)
Admission: RE | Admit: 2020-06-01 | Discharge: 2020-06-01 | Disposition: A | Discharge status: Home / Self Care | Payer: Medicare Other | Source: Ambulatory Visit | Attending: Vascular Surgery | Admitting: Vascular Surgery

## 2020-06-01 ENCOUNTER — Other Ambulatory Visit: Payer: Self-pay

## 2020-06-01 DIAGNOSIS — I739 Peripheral vascular disease, unspecified: Secondary | ICD-10-CM

## 2020-06-01 DIAGNOSIS — I701 Atherosclerosis of renal artery: Secondary | ICD-10-CM | POA: Insufficient documentation

## 2020-06-04 ENCOUNTER — Ambulatory Visit: Payer: Medicare Other | Admitting: Vascular Surgery

## 2020-06-04 ENCOUNTER — Encounter: Payer: Self-pay | Admitting: Vascular Surgery

## 2020-06-04 ENCOUNTER — Other Ambulatory Visit: Payer: Self-pay

## 2020-06-04 VITALS — BP 132/76 | HR 81 | Temp 98.2°F | Resp 20 | Ht 70.0 in | Wt 198.0 lb

## 2020-06-04 DIAGNOSIS — I701 Atherosclerosis of renal artery: Secondary | ICD-10-CM | POA: Diagnosis not present

## 2020-06-04 NOTE — Progress Notes (Signed)
Patient ID: Jeffrey Ewing, male   DOB: 1941-03-24, 80 y.o.   MRN: [id number]  Reason for Consult: New Patient (Initial Visit)   Referred by Crist Infante, MD  Subjective:     HPI:  Jeffrey Ewing is a 80 y.o. male without significant vascular history.  He is a former smoker having quit approximately 20 years ago.  He remains very active.  He has no personal or family history of aneurysm disease.  States that he did have a TIA about 15 years ago no other stroke or recent TIA.  He does have hyperlipidemia.  He is on Aggrenox and Zetia controls his cholesterol very well.  Recently diagnosed with renal artery stenosis but has not had any renal insufficiency with fluid overload or uncontrolled hypertension.  During screening ultrasound of his aorta he was found to have concern for thrombus was sent for urgent CT scan with renal artery stenosis was diagnosed.  Past Medical History:  Diagnosis Date  . 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 History of kidney stones   . History of prostate cancer 2008  . Hyperlipidemia   . 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 Melanoma ([place]) 2001   left cheek  . Osteoarthritis    shoulders, knees  . 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 Alzheimer's disease Mother   . Heart attack Father   . Hypertension Father   . CAD Father   . Colon cancer Neg Hx   . Stomach cancer Neg Hx   . Esophageal cancer Neg Hx   . Rectal cancer Neg Hx    Past Surgical History:  Procedure Laterality Date  . COLONOSCOPY    . COLONOSCOPY W/ POLYPECTOMY  Oct. 2013  . JOINT REPLACEMENT Bilateral   . KNEE ARTHROSCOPY  2009   left  . MASS EXCISION Right 04/03/2016   Procedure: EXCISION cyst right index finger abd debridement proximal interphalangeal;  Surgeon: Daryll Brod, MD;  Location: [place];  Service:  Orthopedics;  Laterality: Right;  FAB  . PROSTATECTOMY  2008  . removal melanoma  2001   left cheek  . REVERSE SHOULDER ARTHROPLASTY Right 03/04/2019   Procedure: REVERSE SHOULDER ARTHROPLASTY;  Surgeon: Netta Cedars, MD;  [facility];  Service: Orthopedics;  Laterality: Right;  . TONSILLECTOMY  as child  . TOTAL KNEE ARTHROPLASTY Right 12/20/2012   Procedure: RIGHT TOTAL KNEE ARTHROPLASTY;  Surgeon: Gearlean Alf, MD;  Location: [facility];  Service: Orthopedics;  Laterality: Right;  . TOTAL KNEE ARTHROPLASTY Bilateral   . VASECTOMY  1978    Short Social History:  Social History   Tobacco Use  . Smoking status: Former Smoker    Packs/day: 1.00    Years: 41.00    Pack years: 41.00    Types: Cigarettes    Quit date: 11/05/2003    Years since quitting: 16.5  . Smokeless tobacco: Never Used  Substance Use Topics  . Alcohol use: No    No Known Allergies  Current Outpatient Medications  Medication Sig Dispense Refill  . CRESTOR 20 MG tablet Take 20 mg by mouth daily.     Marland Kitchen dipyridamole-aspirin (AGGRENOX) 200-25 MG per 12 hr capsule Take 1 capsule by mouth 2 (two) times daily.    Marland Kitchen ezetimibe (ZETIA)  10 MG tablet TAKE 1 TABLET(10 MG) BY MOUTH DAILY 90 tablet 1  . meclizine (ANTIVERT) 25 MG tablet Take 25 mg by mouth 3 (three) times daily as needed for dizziness. (Patient not taking: No sig reported)     No current facility-administered medications for this visit.    Review of Systems  Constitutional:  Constitutional negative. HENT: HENT negative.  Eyes: Eyes negative.  Respiratory: Respiratory negative.  Cardiovascular: Positive for leg swelling.  GI: Gastrointestinal negative.  GU: Genitourinary negative. Musculoskeletal: Musculoskeletal negative.  Neurological: Neurological negative. Hematologic: Hematologic/lymphatic negative.  Psychiatric: Psychiatric negative.        Objective:  Objective   Vitals:   06/04/20 0907  BP: 132/76  Pulse: 81  Resp: 20  Temp:  98.2 F (36.8 C)  SpO2: 96%  Weight: 198 lb (89.8 kg)  Height: 5\' 10"  (1.778 m)   Body mass index is 28.41 kg/m.  Physical Exam HENT:     Head: Normocephalic.     Nose:     Comments: Wearing a mask Eyes:     Pupils: Pupils are equal, round, and reactive to light.  Neck:     Vascular: No carotid bruit.  Cardiovascular:     Pulses: Normal pulses.     Heart sounds: Normal heart sounds.  Abdominal:     General: Abdomen is flat.     Palpations: Abdomen is soft. There is no mass.  Musculoskeletal:        General: No swelling. Normal range of motion.  Skin:    General: Skin is warm and dry.     Capillary Refill: Capillary refill takes less than 2 seconds.  Neurological:     General: No focal deficit present.     Mental Status: He is alert.  Psychiatric:        Mood and Affect: Mood normal.        Behavior: Behavior normal.        Thought Content: Thought content normal.        Judgment: Judgment normal.     Data: ABI Findings:  +---------+------------------+-----+---------+--------+  Right  Rt Pressure (mmHg)IndexWaveform Comment   +---------+------------------+-----+---------+--------+  Brachial 131                      +---------+------------------+-----+---------+--------+  PTA   173        1.22 triphasic      +---------+------------------+-----+---------+--------+  DP    179        1.26 triphasic      +---------+------------------+-----+---------+--------+  Great Toe107        0.75 Normal        +---------+------------------+-----+---------+--------+   +---------+------------------+-----+---------+-------+  Left   Lt Pressure (mmHg)IndexWaveform Comment  +---------+------------------+-----+---------+-------+  Brachial 142                      +---------+------------------+-----+---------+-------+  PTA   173        1.22 triphasic       +---------+------------------+-----+---------+-------+  DP    167        1.18 triphasic      +---------+------------------+-----+---------+-------+  Great Toe166        1.17 Normal        +---------+------------------+-----+---------+-------+    Duplex Findings:   +------------------+--------+--------+-------+  Right Renal ArteryPSV cm/sEDV cm/sComment  +------------------+--------+--------+-------+  Origin               NV    +------------------+--------+--------+-------+  Proximal      151  51        +------------------+--------+--------+-------+  Mid          99    22        +------------------+--------+--------+-------+  Distal       123    34        +------------------+--------+--------+-------+   +-----------------+--------+--------+-------+  Left Renal ArteryPSV cm/sEDV cm/sComment  +-----------------+--------+--------+-------+  Origin               NV    +-----------------+--------+--------+-------+  Proximal       94    27        +-----------------+--------+--------+-------+  Mid         97    28        +-----------------+--------+--------+-------+  Distal        99    24        +-----------------+--------+--------+-------+   +------------+--------+--------+--+-----------+--------+--------+---+  Right KidneyPSV cm/sEDV cm/sRILeft KidneyPSV cm/sEDV cm/sRI   +------------+--------+--------+--+-----------+--------+--------+---+  Upper Pole          Upper Pole             +------------+--------+--------+--+-----------+--------+--------+---+  Mid              Mid                +------------+--------+--------+--+-----------+--------+--------+---+  Lower Pole          Lower Pole              +------------+--------+--------+--+-----------+--------+--------+---+  Hilar             Hilar               +------------+--------+--------+--+-----------+--------+--------+---+   +------------------+-----+------------------+-----+  Right Kidney      Left Kidney         +------------------+-----+------------------+-----+  RAR           RAR             +------------------+-----+------------------+-----+  RAR (manual)      RAR (manual)        +------------------+-----+------------------+-----+  Cortex      0.74 Cortex      1.34   +------------------+-----+------------------+-----+  Cortex thickness    Corex thickness       +------------------+-----+------------------+-----+  Kidney length (cm)10.70Kidney length (cm)10.80  +------------------+-----+------------------+-----+     Summary:  Renal:    Right: Normal size right kidney. No visualized stenosis, unable to     visualize renal artery origin. Patent renal vein.  Left: Normal size of left kidney. No evidence of left renal artery     stenosis. Patent renal vein.    CT IMPRESSION: VASCULAR:  Atherosclerosis of abdominal aorta is noted without aneurysm or acute dissection.  Severe stenosis is noted at origin of right renal artery secondary to eccentric calcified plaque. No thrombus is noted.  NON-VASCULAR:  Sigmoid diverticulosis without inflammation.  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       Assessment/Plan:    80 year old male diagnosed with right renal artery stenosis by recent CT scan.  This was not identified on today's duplex however visualization was not perfect.  ABIs are normal with palpable pulses distally.  Risk factor appears to have been his former smoking history he currently has cholesterol well controlled by Zetia and he is  on Aggrenox.  He can follow-up with me on an as-needed basis.      Waynetta Sandy MD [facility]

## 2020-06-07 ENCOUNTER — Other Ambulatory Visit: Payer: Self-pay | Admitting: Cardiology

## 2020-12-04 ENCOUNTER — Ambulatory Visit: Payer: Medicare Other | Admitting: Neurology

## 2020-12-04 ENCOUNTER — Encounter: Payer: Self-pay | Admitting: Neurology

## 2020-12-04 VITALS — BP 159/82 | HR 63 | Ht 70.0 in | Wt 195.0 lb

## 2020-12-04 DIAGNOSIS — H8109 Meniere's disease, unspecified ear: Secondary | ICD-10-CM

## 2020-12-04 MED ORDER — DIAZEPAM 2 MG PO TABS: 2.0000 mg | ORAL_TABLET | Freq: Two times a day (BID) | ORAL | 3 refills | Status: DC | PRN

## 2020-12-04 NOTE — Progress Notes (Signed)
[facility]  PATIENT: Jeffrey Ewing DOB: [birth year]  REFERRING CLINICIAN: Crist Infante, MD HISTORY FROM: Patient  REASON FOR VISIT: Vertigo    HISTORICAL  CHIEF COMPLAINT:  Chief Complaint  Patient presents with   New Patient (Initial Visit)    R 12, alone, Here to discuss vertigo and balance issues, onset-July 2020, reports no falls, states he never started meclizine    HISTORY OF PRESENT ILLNESS:  This is a 80 year old gentleman with past medical history of vertigo, hyperlipidemia, TIA who is presenting for recurrent vertigo.  Patient states the vertigo started in July 2020.  At that time, he described as spinning sensation without any nausea or vomiting.  He had schedule an appointment with ENT but unfortunately had shoulder injury requiring surgical repair.  After having his shoulder repaired, he reported that his dizziness improve and he was doing well until late summer in 2021 when the dizziness recur.  At that time he described the symptoms as being unsteady like he was on the boat, denies any spinning sensation.  Since summer 2021 he has seen 2 ENT, has seen audiologist, had MRI brain and MRA of head which was all normal, no evidence of intracranial pathology that can explain his recurrent dizziness.  He did follow with audiology and was told that he has hearing loss, he has hearing aids currently.  He also reported intermittent tinnitus, gets fatigue easily.  He mentioned that he was prescribed meclizine but in fact, he has never used it for his dizziness. Currently when he had acute episode of dizziness he will lay down and wait for the symptoms to pass.  He reported the dizziness has caused "extreme anxiety, it is bothersome and limit what he can do.  In the month of July he had 2 episode 1 episode he was picking heavy things while on a boat and the second episode that happened yesterday he picked up a child up to pick a leaf from the tree then had acute  dizziness.  He reported that his dizziness can last up to 3 to 4 hours, again no falls associated with his dizziness. He has completed vestibular rehab in the past which provided some relief but dizziness still persistent.      OTHER MEDICAL CONDITIONS: TIA, Melanoma, HLD, history of kidney stone.    REVIEW OF SYSTEMS: Full 14 system review of systems performed and negative with exception of: as noted in the HPI  ALLERGIES: No Known Allergies  HOME MEDICATIONS: Outpatient Medications Prior to Visit  Medication Sig Dispense Refill   CRESTOR 20 MG tablet Take 20 mg by mouth daily.      dipyridamole-aspirin (AGGRENOX) 200-25 MG per 12 hr capsule Take 1 capsule by mouth 2 (two) times daily.     ezetimibe (ZETIA) 10 MG tablet TAKE 1 TABLET(10 MG) BY MOUTH DAILY 90 tablet 1   meclizine (ANTIVERT) 25 MG tablet Take 25 mg by mouth 3 (three) times daily as needed for dizziness.     No facility-administered medications prior to visit.    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 2013   JOINT REPLACEMENT Bilateral    KNEE ARTHROSCOPY  2009   left   MASS EXCISION Right 04/03/2016   Procedure: EXCISION cyst right index finger abd debridement proximal interphalangeal;  Surgeon: Daryll Brod, MD;  Location: [place];  Service: Orthopedics;  Laterality: Right;  FAB   PROSTATECTOMY  2008   removal melanoma  2001   left cheek   REVERSE SHOULDER ARTHROPLASTY Right  03/04/2019   Procedure: REVERSE SHOULDER ARTHROPLASTY;  Surgeon: Netta Cedars, MD;  Location: [facility];  Service: Orthopedics;  Laterality: Right;   TONSILLECTOMY  as child   TOTAL KNEE ARTHROPLASTY Right 12/20/2012   Procedure: RIGHT TOTAL KNEE ARTHROPLASTY;  Surgeon: Gearlean Alf, MD;  [facility];  Service: Orthopedics;  Laterality: Right;   TOTAL KNEE ARTHROPLASTY Bilateral    VASECTOMY  1978    FAMILY HISTORY: Family History  Problem Relation Age of Onset   Alzheimer's disease Mother    Heart attack Father    Hypertension Father    CAD Father    Colon cancer Neg Hx    Stomach cancer Neg Hx    Esophageal cancer Neg Hx    Rectal cancer Neg Hx     SOCIAL HISTORY: Social History   Socioeconomic History   Marital status: Married    Spouse name: Not on file   Number of children: 3   Years of education: Not on file   Highest education level: Not on file  Occupational History   Not on file  Tobacco Use   Smoking status: Former    Packs/day: 1.00    Years: 41.00    Pack years: 41.00    Types: Cigarettes    Quit date: 11/05/2003    Years since quitting: 17.0   Smokeless tobacco: Never  Vaping Use   Vaping Use: Never used  Substance and Sexual Activity   Alcohol use: No   Drug use: No   Sexual activity: Not on file  Other Topics Concern   Not on file  Social History Narrative   Not on file   Social Determinants of Health   Financial Resource Strain: Not on file  Food Insecurity: Not on file  Transportation Needs: Not on file  Physical Activity: Not on file  Stress: Not on file  Social Connections: Not on file  Intimate Partner Violence: Not on file     PHYSICAL EXAM  GENERAL EXAM/CONSTITUTIONAL: Vitals:  Vitals:   12/04/20 1304  BP: (!) 159/82  Pulse: 63  Weight: 195 lb (88.5 kg)  Height: '5\' 10"'$  (1.778 m)   Body mass index is 27.98 kg/m. Wt Readings from Last 3 Encounters:  12/04/20 195 lb (88.5 kg)  06/04/20 198 lb (89.8 kg)  04/19/20 192  lb (87.1 kg)   Patient is in no distress; well developed, nourished and groomed; neck is supple  CARDIOVASCULAR: Examination of carotid arteries is normal; no carotid bruits Regular rate and rhythm, no murmurs Examination of peripheral vascular system by observation and palpation is normal  EYES: Pupils round and reactive to light, Visual fields full to confrontation, Extraocular movements intacts,   MUSCULOSKELETAL: Gait, strength, tone, movements noted in Neurologic exam below  NEUROLOGIC: MENTAL STATUS: awake, alert, oriented to person, place and time recent  and remote memory intact normal attention and concentration language fluent, comprehension intact, naming intact fund of knowledge appropriate  CRANIAL NERVE:  2nd, 3rd, 4th, 6th - pupils equal and reactive to light, visual fields full to confrontation, extraocular muscles intact, no nystagmus 5th - facial sensation symmetric 7th - facial strength symmetric 8th - hearing intact 9th - palate elevates symmetrically, uvula midline 11th - shoulder shrug symmetric 12th - tongue protrusion midline  MOTOR:  normal bulk and tone, full strength in the BUE, BLE  SENSORY:  normal and symmetric to light touch, pinprick, temperature, vibration  COORDINATION:  finger-nose-finger, fine finger movements normal  REFLEXES:  deep tendon reflexes present and symmetric  GAIT/STATION:  normal    DIAGNOSTIC DATA (LABS, IMAGING, TESTING) - I reviewed patient records, labs, notes, testing and imaging myself where available.  Lab Results  Component Value Date   WBC 7.3 04/19/2020   HGB 14.3 04/19/2020   HCT 42.0 04/19/2020   MCV 105.9 (H) 04/19/2020   PLT PLATELET CLUMPS NOTED ON SMEAR, UNABLE TO ESTIMATE 04/19/2020      Component Value Date/Time   NA 138 04/19/2020 1054   K 4.0 04/19/2020 1054   CL 100 04/19/2020 1054   CO2 27 04/19/2020 1038   GLUCOSE 130 (H) 04/19/2020 1054   BUN 12 04/19/2020 1054   CREATININE  0.90 04/19/2020 1054   CALCIUM 9.4 04/19/2020 1038   PROT 6.5 04/19/2020 1038   ALBUMIN 4.4 04/19/2020 1038   AST 27 04/19/2020 1038   ALT 20 04/19/2020 1038   ALKPHOS 75 04/19/2020 1038   BILITOT 1.0 04/19/2020 1038   GFRNONAA >60 04/19/2020 1038   GFRAA >60 03/05/2019 0221   No results found for: CHOL, HDL, LDLCALC, LDLDIRECT, TRIG, CHOLHDL No results found for: HGBA1C No results found for: VITAMINB12 No results found for: TSH   MRI/MRA 05/15/2020:  1. No evidence of acute intracranial abnormality. 2. A few scattered foci of T2 hyperintensity are seen within the white matter of the cerebral hemispheres, nonspecific. This may represent mild chronic small vessel ischemic disease. 3. Normal MRA of the head.   ASSESSMENT AND PLAN  80 y.o. year old male with past medical history of lipidemia, TIA who is presenting with recurrent dizziness for the past 2 years.  Currently he is having episode about 2/month.  Episodes last 3 to 4 hours.  He did have an MRI and MRA which rule out any intracranial pathology that can explain the dizziness. Because of the length of the episode it is less likely BPPV.  He does have hearing loss and tinnitus.  Another consideration is Mnire's disease.  I will try the patient on low-dose Valium, and continue to follow.  He did have vestibular therapy, reported he know how to do Epley maneuvers by himself and I advised him to continue doing the maneuver if asymptomatic. Follow up in 6 months or sooner if worse.    1. Meniere's disease, unspecified laterality     PLAN: Take low dose Valium as needed  Return to clinic in 6 months or sooner if worse.   No orders of the defined types were placed in this encounter.   Meds ordered this encounter  Medications   diazepam (VALIUM) 2 MG tablet    Sig: Take 1 tablet (2 mg total) by mouth every 12 (twelve) hours as needed.    Dispense:  30 tablet    Refill:  3    Return in about 6 months (around  06/03/2021).  Alric Ran, MD 12/04/2020, 5:38 PM  [facility] [address], [place], [place] [address] [phone]

## 2020-12-04 NOTE — Patient Instructions (Signed)
Take low dose Valium as needed

## 2021-01-21 ENCOUNTER — Encounter: Payer: Medicare Other | Admitting: Physical Therapy

## 2021-01-21 ENCOUNTER — Ambulatory Visit: Payer: Medicare Other | Attending: Internal Medicine | Admitting: Physical Therapy

## 2021-01-21 ENCOUNTER — Other Ambulatory Visit: Payer: Self-pay

## 2021-01-21 ENCOUNTER — Encounter: Payer: Self-pay | Admitting: Physical Therapy

## 2021-01-21 DIAGNOSIS — R2681 Unsteadiness on feet: Secondary | ICD-10-CM | POA: Insufficient documentation

## 2021-01-21 DIAGNOSIS — R42 Dizziness and giddiness: Secondary | ICD-10-CM | POA: Diagnosis not present

## 2021-01-21 DIAGNOSIS — R262 Difficulty in walking, not elsewhere classified: Secondary | ICD-10-CM | POA: Diagnosis present

## 2021-01-22 NOTE — Therapy (Signed)
[facility] [address] [place], [place], [address] Phone: [phone]   Fax:  [phone]  Physical Therapy Evaluation  Patient Details  Name: Jeffrey Ewing MRN: [id number] Date of Birth: [birth year] Referring Provider (PT): Dr. Crist Infante   Encounter Date: 01/21/2021   PT End of Session - 01/22/21 2048     Visit Number 1    Number of Visits 7    Date for PT Re-Evaluation 03/08/21    Authorization Type UHC Medicare    Authorization Time Period 01-21-21 - 03-30-21    PT Start Time 0802    PT Stop Time 0848    PT Time Calculation (min) 46 min    Activity Tolerance Patient tolerated treatment well    Behavior During Therapy [facility] for tasks assessed/performed             Past Medical History:  Diagnosis Date   Atypical nevus 05/04/2013   Left Cheek - Moderate to Severe   Heart defect    "hole in heart"   History of kidney stones    History of prostate cancer 2008   Hyperlipidemia    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 2013   JOINT REPLACEMENT Bilateral    KNEE ARTHROSCOPY  2009   left   MASS EXCISION Right 04/03/2016   Procedure: EXCISION cyst right index finger abd debridement proximal interphalangeal;  Surgeon: Daryll Brod, MD;  Location: [facility];  Service: Orthopedics;  Laterality: Right;  FAB   PROSTATECTOMY  2008   removal melanoma  2001   left cheek   REVERSE SHOULDER ARTHROPLASTY Right 03/04/2019   Procedure: REVERSE SHOULDER ARTHROPLASTY;  Surgeon: Netta Cedars, MD;  [facility];  Service: Orthopedics;  Laterality: Right;   TONSILLECTOMY  as child   TOTAL KNEE ARTHROPLASTY Right 12/20/2012   Procedure: RIGHT TOTAL KNEE  ARTHROPLASTY;  Surgeon: Gearlean Alf, MD;  Location[facility];  Service: Orthopedics;  Laterality: Right;   TOTAL KNEE ARTHROPLASTY Bilateral    VASECTOMY  1978    There were no vitals filed for this visit.        [facility] PT Assessment - 01/22/21 0001       Assessment   Medical Diagnosis Vertigo    Referring Provider (PT) Dr. Crist Infante    Onset Date/Surgical Date --   July 2020 intial onset; most recent onset July 2022   Prior Therapy pt had vestibular PT at this facility from Oct. 2021 to Jan. 2022 for 5 visits      Precautions   Precautions Fall      Balance Screen   Has the patient fallen in the past 6 months No    Has the patient had a decrease in activity level because of a fear of falling?  No    Is the patient reluctant to leave their home because of a fear of falling?  No      Prior Function   Level of Independence Independent      Observation/Other Assessments   Focus on Therapeutic Outcomes (FOTO)  DFS 56;  DPS 60.7  Vestibular Assessment - 01/22/21 0001       Symptom Behavior   Subjective history of current problem Pt states he wants to get rid of the "pitch and roll" sensation:  had episode on 01-10-21 when he got up quickly out of bed; does everything he wants to do, just does it slowly    Type of Dizziness  Imbalance;Unsteady with head/body turns;"Funny feeling in head";Vertigo    Frequency of Dizziness intermittent    Duration of Dizziness varies - minutes to hours    Symptom Nature Motion provoked;Variable    Aggravating Factors Comment;Walking in a crowd;Spontaneous onset    Relieving Factors Rest    Progression of Symptoms Better    History of similar episodes had episodes in July 2020, July 2022, and most recently on 01-10-21      Oculomotor Exam   Oculomotor Alignment Normal    Smooth Pursuits Intact    Saccades Intact                Objective measurements completed on examination: See above findings.                 PT Education - 01/22/21 2044     Education Details educated pt on symptoms of Meniere's disease - gave handout on Hydrops and Meniere's disease and Meniere's disease from [employer]    Person(s) Educated Patient    Methods Explanation;Demonstration;Handout    Comprehension Verbalized understanding              PT Short Term Goals - 01/22/21 2107       PT SHORT TERM GOAL #1   Title Pt will participate in assessment of FGA    Time 3    Period Weeks    Status New    Target Date 02/15/21      PT SHORT TERM GOAL #2   Title Initiate balance/vestibular HEP.    Time 3    Period Weeks    Status New    Target Date 02/15/21      PT SHORT TERM GOAL #3   Title Pt will verbalize understanding of diet recommendations for Meniere's disease management.    Time 3    Period Weeks    Status New    Target Date 02/15/21               PT Long Term Goals - 01/22/21 2109       PT LONG TERM GOAL #1   Title Pt will demonstrate independence with final vestibular/balance HEP    Time 6    Period Weeks    Status New    Target Date 03/08/21      PT LONG TERM GOAL #2   Title Pt will increase FOTO DFS score from 56 to >/= 61 to demo improvement in vertigo.    Baseline DFS 56;  DPS 60.7    Time 6    Period Weeks    Status New    Target Date 03/08/21      PT LONG TERM GOAL #3   Title Pt will increase FGA score by 4 points to indicate decreased falls risk    Time 6    Period Weeks    Status New    Target Date 03/08/21      PT LONG TERM GOAL #4   Title Pt will report improvement in balance/stability with ability to pick up grandchildren from school with improved confidence and less anxiety.    Time 6  Period Weeks    Status New    Target Date 03/08/21                    Plan - 01/22/21 2051     Clinical Impression Statement Pt is an 80 yr old gentleman with c/o dizziness/dysequilibrium that initially started in July 2020.  Pt reports he had  improvement and then had another episode in July 2022.  Pt was evaluated by neurologist, Dr. April Manson, and was diagnosed with possible Meniere's disease.  Pt's oculomotor exam is WNL's with no nystagmus noted with saccades or smooth pursuits. A VNG test was recommended by Dr. Silvestre Moment but was not performed due to pt having orthostatic hypotension at that time (Aug. 2022).  Pt will benefit from skilled PT to address balance and vestibular deficits.    Personal Factors and Comorbidities Comorbidity 2;Past/Current Experience;Time since onset of injury/illness/exacerbation    Comorbidities TIA 2005, h/o prostate cancer, OA, tinnitus    Examination-Activity Limitations Transfers;Locomotion Level;Bend;Lift;Reach Overhead    Examination-Participation Restrictions Cleaning;Driving;Interpersonal Relationship;Shop;Community Activity;Meal Prep    Stability/Clinical Decision Making Evolving/Moderate complexity    Clinical Decision Making Moderate    Rehab Potential Good    PT Frequency 1x / week    PT Duration 6 weeks    PT Treatment/Interventions ADLs/Self Care Home Management;Neuromuscular re-education;Gait training;Therapeutic activities;Therapeutic exercise;Balance training;Patient/family education;Vestibular;Canalith Repostioning    PT Next Visit Plan check for BPPV: do MCTSIB; check DVA    Recommended Other Services Pt may benefit from VNG test - recommended by Dr. Silvestre Moment in August 2022, but was not performed due to pt's orthostatic hypotension    Consulted and Agree with Plan of Care Patient             Patient will benefit from skilled therapeutic intervention in order to improve the following deficits and impairments:  Dizziness, Decreased balance, Difficulty walking  Visit Diagnosis: Dizziness and giddiness - Plan: PT plan of care cert/re-cert  Unsteadiness on feet - Plan: PT plan of care cert/re-cert  Difficulty in walking, not elsewhere classified - Plan: PT plan of care  cert/re-cert     Problem List Patient Active Problem List   Diagnosis Date Noted   Hearing loss 05/15/2020   Prostate cancer ([place]) 05/15/2020   Tinnitus 05/15/2020   History of colon polyps 05/15/2020   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   History of bilateral knee arthroplasty 05/15/2020   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   Imbalance 11/29/2019   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   Contusion of chest 10/05/2019   Contusion of right hand 10/05/2019   Fall from, out of or through other building or structure, initial encounter 10/05/2019   Pleurodynia 10/05/2019   Radial styloid tenosynovitis of left hand 09/08/2019   Chronic pain of left wrist 09/05/2019   Acquired spondylolisthesis 08/01/2019   Disorder of arteries and arterioles, 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   Neck pain 08/01/2019   S/P shoulder replacement, right 03/04/2019   Cardiac murmur, unspecified 01/21/2019   Orthostatic hypotension 01/21/2019   Dizziness 10/15/2018   Malignant melanoma of face excluding eyelid, nose, lip, and ear ([place]) 10/15/2018   Acute non-infective otitis externa 10/04/2018   Diverticula of intestine 06/24/2018   Kidney stone 06/24/2018   Asymptomatic microscopic hematuria 06/14/2018   Elevated blood-pressure reading without diagnosis of hypertension 06/14/2018   Thrombocytopenia ([place]) 06/14/2018   Hardening of the aorta (main artery of the heart) ([place]) 04/16/2018   Shoulder joint pain 12/17/2017  Arthritis of hand 06/24/2017   Benign neoplasm of colon 06/10/2017   Encounter for general adult medical examination without abnormal findings 06/10/2017   Hyperlipidemia 06/10/2017   Pain in finger 06/10/2017   Transient ischemic attack 06/10/2017   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

## 2021-02-04 ENCOUNTER — Ambulatory Visit: Payer: Medicare Other | Attending: Internal Medicine | Admitting: Physical Therapy

## 2021-02-04 ENCOUNTER — Other Ambulatory Visit: Payer: Self-pay

## 2021-02-04 DIAGNOSIS — R42 Dizziness and giddiness: Secondary | ICD-10-CM | POA: Diagnosis present

## 2021-02-04 DIAGNOSIS — R2681 Unsteadiness on feet: Secondary | ICD-10-CM | POA: Diagnosis not present

## 2021-02-04 NOTE — Patient Instructions (Addendum)
  Standing on pillows in corner;  Eyes open - feet apart, feet together;  Eyes closed - feet apart, feet together with head turns all 4 positions    Gaze Stabilization: Tip Card  1.Target must remain in focus, not blurry, and appear stationary while head is in motion. 2.Perform exercises with small head movements (45 to either side of midline). 3.Increase speed of head motion so long as target is in focus. 4.If you wear eyeglasses, be sure you can see target through lens (therapist will give specific instructions for bifocal / progressive lenses). 5.These exercises may provoke dizziness or nausea. Work through these symptoms. If too dizzy, slow head movement slightly. Rest between each exercise. 6.Exercises demand concentration; avoid distractions. 7.For safety, perform standing exercises close to a counter, wall, corner, or next to someone.      Gaze Stabilization: Standing Feet Apart    Feet shoulder width apart, keeping eyes on target on wall __5-6__ feet away, tilt head down 15-30 and move head side to side for _60___ seconds. Repeat while moving head up and down for _60___ seconds. Do __60__ sessions per day. Repeat using target on pattern background.

## 2021-02-05 ENCOUNTER — Encounter: Payer: Self-pay | Admitting: Physical Therapy

## 2021-02-05 NOTE — Therapy (Signed)
[facility] [address] [place], [place], [address] Phone: [phone]   Fax:  [phone]  Physical Therapy Treatment  Patient Details  Name: Jeffrey Ewing MRN: [id number] Date of Birth: July 11, 1940 Referring Provider (PT): Dr. Crist Infante   Encounter Date: 02/04/2021   PT End of Session - 02/05/21 1638     Visit Number 2    Number of Visits 7    Date for PT Re-Evaluation 03/08/21    Authorization Type UHC Medicare    Authorization Time Period 01-21-21 - 03-30-21    PT Start Time 1102    PT Stop Time 1148    PT Time Calculation (min) 46 min    Activity Tolerance Patient tolerated treatment well    Behavior During Therapy [facility] for tasks assessed/performed             Past Medical History:  Diagnosis Date   Atypical nevus 05/04/2013   Left Cheek - Moderate to Severe   Heart defect    "hole in heart"   History of kidney stones    History of prostate cancer 2008   Hyperlipidemia    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 2013   JOINT REPLACEMENT Bilateral    KNEE ARTHROSCOPY  2009   left   MASS EXCISION Right 04/03/2016   Procedure: EXCISION cyst right index finger abd debridement proximal interphalangeal;  Surgeon: Daryll Brod, MD;  Location: [facility];  Service: Orthopedics;  Laterality: Right;  FAB   PROSTATECTOMY  2008   removal melanoma  2001   left cheek   REVERSE SHOULDER ARTHROPLASTY Right 03/04/2019   Procedure: REVERSE SHOULDER ARTHROPLASTY;  Surgeon: Netta Cedars, MD;  [facility];  Service: Orthopedics;  Laterality: Right;   TONSILLECTOMY  as child   TOTAL KNEE ARTHROPLASTY Right 12/20/2012   Procedure: RIGHT TOTAL KNEE  ARTHROPLASTY;  Surgeon: Gearlean Alf, MD;  [facility];  Service: Orthopedics;  Laterality: Right;   TOTAL KNEE ARTHROPLASTY Bilateral    VASECTOMY  1978    There were no vitals filed for this visit.   Subjective Assessment - 02/05/21 1626     Subjective Pt reports he is doing rather well at this time - does not want to do anything that may get the dizziness "stirred up"  - pt requests not to do positional testing at this time.  Pt states he did implement some of the diet recommendations per info sheet Hydrops & Meniere's disease given to him at last session, and thinks it may be helping.  Wants to continue working on the exercises at home.    Diagnostic tests MRI and MRA both negative    Patient Stated Goals Improve balance and reduce vertigo                               [facility] Adult PT Treatment/Exercise - 02/05/21 0001       Transfers   Transfers Sit to Stand;Stand to Sit    Sit to Stand 4: Min guard    Stand to Sit 5: Supervision    Number of  Reps Other reps (comment)   3 reps EO without UE support:  5 reps with EC feet on floor with CGA            Vestibular Treatment/Exercise - 02/05/21 0001       Vestibular Treatment/Exercise   Vestibular Treatment Provided Gaze    Gaze Exercises X1 Viewing Horizontal;X1 Viewing Vertical      X1 Viewing Horizontal   Foot Position bil. stance    Time --   30 secs   Reps 1      X1 Viewing Vertical   Foot Position bil. stance    Time --   30 secs   Reps 1                Balance Exercises - 02/05/21 0001       Balance Exercises: Standing   Standing Eyes Opened Narrow base of support (BOS);Wide (BOA);Head turns;Foam/compliant surface;5 reps   horizontal and vertical   Standing Eyes Closed Narrow base of support (BOS);Wide (BOA);Head turns;Foam/compliant surface;5 reps   horizontal & vertical head turns   SLS Eyes open;Solid surface;5 reps   horizontal head turns - holding at counter            Pt performed SLS on each leg - holding onto counter bil. UE support - performed horizontal head turns 5 reps;  vertical head turns 5 reps     PT Education - 02/05/21 1637     Education Details issued HEP - balance on foam all 4 positions; x1 viewing horizontal & vertical;  SLS with head turns, sit to stand without UE support    Person(s) Educated Patient    Methods Explanation;Demonstration;Handout    Comprehension Verbalized understanding;Returned demonstration              PT Short Term Goals - 02/05/21 1647       PT SHORT TERM GOAL #1   Title Pt will participate in assessment of FGA    Time 3    Period Weeks    Status New    Target Date 02/15/21      PT SHORT TERM GOAL #2   Title Initiate balance/vestibular HEP.    Time 3    Period Weeks    Status New    Target Date 02/15/21      PT SHORT TERM GOAL #3   Title Pt will verbalize understanding of diet recommendations for Meniere's disease management.    Time 3    Period Weeks    Status New    Target Date 02/15/21               PT Long Term Goals - 02/05/21 1647       PT LONG TERM GOAL #1   Title Pt will demonstrate independence with final vestibular/balance HEP    Time 6    Period Weeks    Status New      PT LONG TERM GOAL #2   Title Pt will increase FOTO DFS score from 56 to >/= 61 to demo improvement in vertigo.    Baseline DFS 56;  DPS 60.7    Time 6    Period Weeks    Status New      PT LONG TERM GOAL #3   Title Pt will increase FGA score by 4 points to indicate decreased falls risk    Time 6    Period Weeks    Status New      PT LONG TERM  GOAL #4   Title Pt will report improvement in balance/stability with ability to pick up grandchildren from school with improved confidence and less anxiety.    Time 6    Period Weeks    Status New                   Plan - 02/05/21 1639     Clinical Impression Statement Pt able to perform standing balance exercises on pillows with  feet apart and feet together with EO and EC with mild postural instability with EO and moderate postural instability with EC, indicative of vestibular hypofunction.  Pt declined positional testing at today's session, due to fear of provoking severe vertigo.  Pt reports he feels his legs are weak and reports feeling that his postural instability "is in my legs, not my head".  Cont with POC.    Personal Factors and Comorbidities Comorbidity 2;Past/Current Experience;Time since onset of injury/illness/exacerbation    Comorbidities TIA 2005, h/o prostate cancer, OA, tinnitus    Examination-Activity Limitations Transfers;Locomotion Level;Bend;Lift;Reach Overhead    Examination-Participation Restrictions Cleaning;Driving;Interpersonal Relationship;Shop;Community Activity;Meal Prep    Stability/Clinical Decision Making Evolving/Moderate complexity    Rehab Potential Good    PT Frequency 1x / week    PT Duration 6 weeks    PT Treatment/Interventions ADLs/Self Care Home Management;Neuromuscular re-education;Gait training;Therapeutic activities;Therapeutic exercise;Balance training;Patient/family education;Vestibular;Canalith Repostioning    PT Next Visit Plan check STG's: check HEP - x1 viewing & balance on foam; cont with balance exs with increased vestibular input required    Consulted and Agree with Plan of Care Patient             Patient will benefit from skilled therapeutic intervention in order to improve the following deficits and impairments:  Dizziness, Decreased balance, Difficulty walking  Visit Diagnosis: Unsteadiness on feet  Dizziness and giddiness     Problem List Patient Active Problem List   Diagnosis Date Noted   Hearing loss 05/15/2020   Prostate cancer ([place]) 05/15/2020   Tinnitus 05/15/2020   History of colon polyps 05/15/2020   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   History of bilateral knee arthroplasty 05/15/2020   Renal artery  stenosis ([place]) 05/15/2020   Imbalance 11/29/2019   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   Contusion of chest 10/05/2019   Contusion of right hand 10/05/2019   Fall from, out of or through other building or structure, initial encounter 10/05/2019   Pleurodynia 10/05/2019   Radial styloid tenosynovitis of left hand 09/08/2019   Chronic pain of left wrist 09/05/2019   Acquired spondylolisthesis 08/01/2019   Disorder of arteries and arterioles, 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   Neck pain 08/01/2019   S/P shoulder replacement, right 03/04/2019   Cardiac murmur, unspecified 01/21/2019   Orthostatic hypotension 01/21/2019   Dizziness 10/15/2018   Malignant melanoma of face excluding eyelid, nose, lip, and ear ([place]) 10/15/2018   Acute non-infective otitis externa 10/04/2018   Diverticula of intestine 06/24/2018   Kidney stone 06/24/2018   Asymptomatic microscopic hematuria 06/14/2018   Elevated blood-pressure reading without diagnosis of hypertension 06/14/2018   Thrombocytopenia ([place]) 06/14/2018   Hardening of the aorta (main artery of the heart) ([place]) 04/16/2018   Shoulder joint pain 12/17/2017   Arthritis of hand 06/24/2017   Benign neoplasm of colon 06/10/2017   Encounter for general adult medical examination without abnormal findings 06/10/2017   Hyperlipidemia 06/10/2017   Pain in finger 06/10/2017   Transient ischemic attack 06/10/2017   Degenerative joint disease  of shoulder region 05/26/2017   Mucoid cyst, joint 02/27/2016   Osteoarthritis of finger of right hand 02/27/2016   Postoperative anemia due to acute blood loss 12/21/2012   OA (osteoarthritis) of knee 12/20/2012    [id number], VZDGL [id number], PT 02/05/2021, 4:48 PM  [facility] [address] [place], [place], [address] Phone: [phone]   Fax:  [phone]  Name: Jeffrey Ewing MRN: [id number] Date of Birth: October 03, 1940

## 2021-02-08 ENCOUNTER — Telehealth: Payer: Self-pay | Admitting: Neurology

## 2021-02-08 NOTE — Telephone Encounter (Signed)
Pt requesting refill for diazepam (VALIUM) 2 MG tablet. Pharmacy [place], [place] [facility] [address]

## 2021-02-11 ENCOUNTER — Other Ambulatory Visit: Payer: Self-pay

## 2021-02-11 ENCOUNTER — Ambulatory Visit: Payer: Medicare Other | Admitting: Physical Therapy

## 2021-02-11 DIAGNOSIS — R2681 Unsteadiness on feet: Secondary | ICD-10-CM

## 2021-02-11 DIAGNOSIS — R42 Dizziness and giddiness: Secondary | ICD-10-CM

## 2021-02-11 NOTE — Patient Instructions (Signed)
   Strengthening: Hip Abduction - Resisted    With tubing around right leg, other side toward anchor, extend leg out from side. Repeat _10___ times per set. Do __1__ sets per session. Do _1___ sessions per day.     Strengthening: Hip Extension - Resisted    With tubing around right ankle, face anchor and pull leg straight back. Repeat __10__ times per set. Do _1___ sets per session. Do _1___ sessions per day.       Strengthening: Hip Flexion - Resisted    With tubing around left ankle, anchor behind, bring leg forward, keeping knee straight. Repeat __10__ times per set. Do __1__ sets per session. Do __1__ sessions per day.    ALSO DO -- LEG LIFT- STEP ON BAND WITH OPPOSITE FOOT - LIFT OTHER LEG UP WITH KNEE BENT ;  10 TIMES EACH LEG

## 2021-02-11 NOTE — Telephone Encounter (Signed)
Per [place] narcotic registry, he filled the following prescriptions for diazepam 2mg :  1) #30 on 9/6 2) #30 on 9/20 3) #30 on 10/7 4) #30 on 10/18 5) #30 on 10/20 6) #30 on 11/9  Four of these refills were from Dr. April Manson. Two were from Dr. Marjory Lies at the [facility] in [place], [place].  I spoke to the patient. He was concerned about running out too soon. He has been taking it twice daily scheduled rather than prn. He has developed more of chronic anxiousness. I encouraged him to make an appt with his PCP to discuss safer medication options to treat anxiety long term. He has an surplus of diazepam at home. He will ask his PCP for further management of this med. No other refills will be provided from our office. He was in agreement and verbalized understanding of this plan.

## 2021-02-11 NOTE — Therapy (Signed)
[facility] [address] [place], [place], [address] Phone: [phone]   Fax:  [phone]  Physical Therapy Treatment  Patient Details  Name: Jeffrey Ewing MRN: [id number] Date of Birth: 04/29/1940 Referring Provider (PT): Dr. Crist Infante   Encounter Date: 02/11/2021   PT End of Session - 02/11/21 1900     Visit Number 3    Number of Visits 7    Date for PT Re-Evaluation 03/08/21    Authorization Type UHC Medicare    Authorization Time Period 01-21-21 - 03-30-21    PT Start Time 1103    PT Stop Time 1146    PT Time Calculation (min) 43 min    Activity Tolerance Patient tolerated treatment well    Behavior During Therapy [facility] for tasks assessed/performed             Past Medical History:  Diagnosis Date   Atypical nevus 05/04/2013   Left Cheek - Moderate to Severe   Heart defect    "hole in heart"   History of kidney stones    History of prostate cancer 2008   Hyperlipidemia    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 2013   JOINT REPLACEMENT Bilateral    KNEE ARTHROSCOPY  2009   left   MASS EXCISION Right 04/03/2016   Procedure: EXCISION cyst right index finger abd debridement proximal interphalangeal;  Surgeon: Daryll Brod, MD;  Location: [place];  Service: Orthopedics;  Laterality: Right;  FAB   PROSTATECTOMY  2008   removal melanoma  2001   left cheek   REVERSE SHOULDER ARTHROPLASTY Right 03/04/2019   Procedure: REVERSE SHOULDER ARTHROPLASTY;  Surgeon: Netta Cedars, MD;  Location: [facility];  Service: Orthopedics;  Laterality: Right;   TONSILLECTOMY  as child   TOTAL KNEE ARTHROPLASTY Right 12/20/2012   Procedure: RIGHT TOTAL KNEE  ARTHROPLASTY;  Surgeon: Gearlean Alf, MD;  Location: [facility];  Service: Orthopedics;  Laterality: Right;   TOTAL KNEE ARTHROPLASTY Bilateral    VASECTOMY  1978    There were no vitals filed for this visit.   Subjective Assessment - 02/11/21 1849     Subjective Pt states he was prescribed Valium by his doctor to take for his anxiety - has been taking it on a regular basis rather than prn when dizziness/anxiety is particularly increased; states he is going to start weaning off of this medication and will take Dramamine prn.  Pt also informed that Bonine is also an OTC medicine for motion sickness.  Pt states his balance is improving - was able to stand and place each leg in his pant leg in standing    Diagnostic tests MRI and MRA both negative    Patient Stated Goals Improve balance and reduce vertigo    Currently in Pain? No/denies                               [facility] Adult PT Treatment/Exercise - 02/11/21 0001       Transfers   Transfers Sit to Stand;Stand to Sit    Sit to Stand 4: Min  guard    Stand to Sit 5: Supervision    Number of Reps 10 reps    Comments feet on Airex - EC - no UE support used      Exercises   Exercises Knee/Hip      Knee/Hip Exercises: Standing   Heel Raises Both;1 set;10 reps    Hip Flexion Stengthening;Right;Left;2 sets;10 reps;Knee bent;Knee straight   with red theraband   Hip Abduction Stengthening;Right;Left;1 set;10 reps;Knee straight   with red theraband   Hip Extension Stengthening;Right;Left;1 set;10 reps;Knee straight   with red theraband                Balance Exercises - 02/11/21 0001       Balance Exercises: Standing   Rockerboard Anterior/posterior;EO;EC;Other reps (comment)   20 reps - 10 reps EO and 10 reps EC with decreasing UE support   Other Standing Exercises Pt performed stepping down from rockerboard to floor forward 5 reps each - cues to look straight ahead and not down at floor; stepping backwards  from rockerboard to floor 5 reps each foot            Standing on rockerboard with UE support on // bars prn - head turns horizontally 5 reps; vertical head turns 5 reps EO Bil. UE support with EC with head turns - 5 reps horizontal, 5 reps vertical     PT Education - 02/11/21 1859     Education Details added theraband kicks to HEP - red theraband used and given to pt:  hip flexion, extension and abduction    Person(s) Educated Patient    Methods Explanation;Demonstration;Handout    Comprehension Verbalized understanding;Returned demonstration              PT Short Term Goals - 02/11/21 1904       PT SHORT TERM GOAL #1   Title Pt will participate in assessment of FGA    Time 3    Period Weeks    Status New    Target Date 02/15/21      PT SHORT TERM GOAL #2   Title Initiate balance/vestibular HEP.    Baseline met 02-04-21    Time 3    Period Weeks    Status Achieved    Target Date 02/15/21      PT SHORT TERM GOAL #3   Title Pt will verbalize understanding of diet recommendations for Meniere's disease management.    Baseline met 02-04-21    Time 3    Period Weeks    Status Achieved    Target Date 02/15/21               PT Long Term Goals - 02/11/21 1906       PT LONG TERM GOAL #1   Title Pt will demonstrate independence with final vestibular/balance HEP    Time 6    Period Weeks    Status New      PT LONG TERM GOAL #2   Title Pt will increase FOTO DFS score from 56 to >/= 61 to demo improvement in vertigo.    Baseline DFS 56;  DPS 60.7    Time 6    Period Weeks    Status New      PT LONG TERM GOAL #3   Title Pt will increase FGA score by 4 points to indicate decreased falls risk    Time 6    Period Weeks    Status New  PT LONG TERM GOAL #4   Title Pt will report improvement in balance/stability with ability to pick up grandchildren from school with improved confidence and less anxiety.    Time 6    Period Weeks    Status New                    Plan - 02/11/21 1900     Clinical Impression Statement Pt needed UE support for assist with balance activity inside // bars.  Pt also needed cues to look straight ahead and not down at his feet with this activity.  Pt stated the leg exercises with use of the red theraband were beneficial - stating he could feel muscles working in each leg.  Pt is progressing well towards goals.  Cont with POC.    Personal Factors and Comorbidities Comorbidity 2;Past/Current Experience;Time since onset of injury/illness/exacerbation    Comorbidities TIA 2005, h/o prostate cancer, OA, tinnitus    Examination-Activity Limitations Transfers;Locomotion Level;Bend;Lift;Reach Overhead    Examination-Participation Restrictions Cleaning;Driving;Interpersonal Relationship;Shop;Community Activity;Meal Prep    Stability/Clinical Decision Making Evolving/Moderate complexity    Rehab Potential Good    PT Frequency 1x / week    PT Duration 6 weeks    PT Treatment/Interventions ADLs/Self Care Home Management;Neuromuscular re-education;Gait training;Therapeutic activities;Therapeutic exercise;Balance training;Patient/family education;Vestibular;Canalith Repostioning    PT Next Visit Plan do FGA for STG #1 assessment: check HEP - x1 viewing & balance on foam; cont with balance exs with increased vestibular input required    Consulted and Agree with Plan of Care Patient             Patient will benefit from skilled therapeutic intervention in order to improve the following deficits and impairments:  Dizziness, Decreased balance, Difficulty walking  Visit Diagnosis: Unsteadiness on feet  Dizziness and giddiness     Problem List Patient Active Problem List   Diagnosis Date Noted   Hearing loss 05/15/2020   Prostate cancer ([place]) 05/15/2020   Tinnitus 05/15/2020   History of colon polyps 05/15/2020   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   History of bilateral knee  arthroplasty 05/15/2020   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   Imbalance 11/29/2019   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   Contusion of chest 10/05/2019   Contusion of right hand 10/05/2019   Fall from, out of or through other building or structure, initial encounter 10/05/2019   Pleurodynia 10/05/2019   Radial styloid tenosynovitis of left hand 09/08/2019   Chronic pain of left wrist 09/05/2019   Acquired spondylolisthesis 08/01/2019   Disorder of arteries and arterioles, 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   Neck pain 08/01/2019   S/P shoulder replacement, right 03/04/2019   Cardiac murmur, unspecified 01/21/2019   Orthostatic hypotension 01/21/2019   Dizziness 10/15/2018   Malignant melanoma of face excluding eyelid, nose, lip, and ear ([place]) 10/15/2018   Acute non-infective otitis externa 10/04/2018   Diverticula of intestine 06/24/2018   Kidney stone 06/24/2018   Asymptomatic microscopic hematuria 06/14/2018   Elevated blood-pressure reading without diagnosis of hypertension 06/14/2018   Thrombocytopenia ([place]) 06/14/2018   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   Shoulder joint pain 12/17/2017   Arthritis of hand 06/24/2017   Benign neoplasm of colon 06/10/2017   Encounter for general adult medical examination without abnormal findings 06/10/2017   Hyperlipidemia 06/10/2017   Pain in finger 06/10/2017   Transient ischemic attack 06/10/2017   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 [place], [place], [address] Phone: [phone]   Fax:  [phone]  Name: Jeffrey Ewing MRN: [id number] Date of Birth:  09-07-1940

## 2021-02-18 ENCOUNTER — Other Ambulatory Visit: Payer: Self-pay

## 2021-02-18 ENCOUNTER — Ambulatory Visit: Payer: Medicare Other | Admitting: Physical Therapy

## 2021-02-18 DIAGNOSIS — R2681 Unsteadiness on feet: Secondary | ICD-10-CM

## 2021-02-18 DIAGNOSIS — R42 Dizziness and giddiness: Secondary | ICD-10-CM

## 2021-02-19 ENCOUNTER — Encounter: Payer: Self-pay | Admitting: Physical Therapy

## 2021-02-19 NOTE — Therapy (Signed)
[facility] [address] [place], [place], [address] Phone: [phone]   Fax:  [phone]  Physical Therapy Treatment  Patient Details  Name: Jeffrey Ewing MRN: [id number] Date of Birth: 04-Jul-1940 Referring Provider (PT): Dr. Crist Infante   Encounter Date: 02/18/2021   PT End of Session - 02/19/21 1929     Visit Number 4    Number of Visits 7    Date for PT Re-Evaluation 03/08/21    Authorization Type UHC Medicare    Authorization Time Period 01-21-21 - 03-30-21    PT Start Time 1104    PT Stop Time 1150    PT Time Calculation (min) 46 min    Equipment Utilized During Treatment Gait belt    Activity Tolerance Patient tolerated treatment well    Behavior During Therapy [facility] for tasks assessed/performed             Past Medical History:  Diagnosis Date   Atypical nevus 05/04/2013   Left Cheek - Moderate to Severe   Heart defect    "hole in heart"   History of kidney stones    History of prostate cancer 2008   Hyperlipidemia    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 2013   JOINT REPLACEMENT Bilateral    KNEE ARTHROSCOPY  2009   left   MASS EXCISION Right 04/03/2016   Procedure: EXCISION cyst right index finger abd debridement proximal interphalangeal;  Surgeon: Daryll Brod, MD;  Location: [place];  Service: Orthopedics;  Laterality: Right;  FAB   PROSTATECTOMY  2008   removal melanoma  2001   left cheek   REVERSE SHOULDER ARTHROPLASTY Right 03/04/2019   Procedure: REVERSE SHOULDER ARTHROPLASTY;  Surgeon: Netta Cedars, MD;  [facility];  Service: Orthopedics;  Laterality: Right;   TONSILLECTOMY  as child   TOTAL KNEE  ARTHROPLASTY Right 12/20/2012   Procedure: RIGHT TOTAL KNEE ARTHROPLASTY;  Surgeon: Gearlean Alf, MD;  [facility];  Service: Orthopedics;  Laterality: Right;   TOTAL KNEE ARTHROPLASTY Bilateral    VASECTOMY  1978    There were no vitals filed for this visit.   Subjective Assessment - 02/19/21 1920     Subjective Pt states he is doing better - says his family wants to go to the beach for Thanksgiving and he has concerns about that due to his unsteadiness/balance issues going on    Diagnostic tests MRI and MRA both negative    Patient Stated Goals Improve balance and reduce vertigo    Currently in Pain? No/denies                               [facility] Adult PT Treatment/Exercise - 02/19/21 0001       Transfers   Transfers Sit to Stand;Stand to Sit    Sit to Stand 4: Min guard    Stand to Sit 5: Supervision    Number of Reps Other reps (comment)   5 reps   Comments feet on Airex - EC - no UE support used      Knee/Hip Exercises: Standing  Functional Squat 1 set;10 reps   standing on Airex - EO - with UE support on // bars prn                Balance Exercises - 02/19/21 0001       Balance Exercises: Standing   Standing Eyes Opened Wide (BOA);Head turns;Foam/compliant surface;5 reps   horizontal and vertical head turns   Standing Eyes Closed Wide (BOA);Head turns;Foam/compliant surface;5 reps   horizontal and vertical head turns   Rockerboard Anterior/posterior;EO;EC;Other reps (comment)   20 reps - 10 reps EO and 10 reps EC with decreasing UE support   Turning Both   Rt & Lt sides 2 reps each - 4 reps total   Marching Foam/compliant surface;Head turns;Static;10 reps   with UE support prn   Other Standing Exercises Pt performed stepping down from rockerboard to floor forward 5 reps each - cues to look straight ahead and not down at floor; stepping backwards from rockerboard to floor 5 reps each foot                  PT Short Term Goals  - 02/19/21 1935       PT SHORT TERM GOAL #1   Title Pt will participate in assessment of FGA    Time 3    Period Weeks    Status On-going    Target Date 02/15/21      PT SHORT TERM GOAL #2   Title Initiate balance/vestibular HEP.    Baseline met 02-04-21    Time 3    Period Weeks    Status Achieved    Target Date 02/15/21      PT SHORT TERM GOAL #3   Title Pt will verbalize understanding of diet recommendations for Meniere's disease management.    Baseline met 02-04-21    Time 3    Period Weeks    Status Achieved    Target Date 02/15/21               PT Long Term Goals - 02/19/21 1935       PT LONG TERM GOAL #1   Title Pt will demonstrate independence with final vestibular/balance HEP    Time 6    Period Weeks    Status New      PT LONG TERM GOAL #2   Title Pt will increase FOTO DFS score from 56 to >/= 61 to demo improvement in vertigo.    Baseline DFS 56;  DPS 60.7    Time 6    Period Weeks    Status New      PT LONG TERM GOAL #3   Title Pt will increase FGA score by 4 points to indicate decreased falls risk    Time 6    Period Weeks    Status New      PT LONG TERM GOAL #4   Title Pt will report improvement in balance/stability with ability to pick up grandchildren from school with improved confidence and less anxiety.    Time 6    Period Weeks    Status New                   Plan - 02/19/21 1929     Clinical Impression Statement Pt is progressing well towards goals; pt able to perform balance activity on mini trampoline with minimal LOB with minimal UE support on bar and with close CGA.  Pt continues to have most diffiiculty maintaining balance  with EC, indicative of decr. vestibular input in maintaining balance.  Cont with POC.    Personal Factors and Comorbidities Comorbidity 2;Past/Current Experience;Time since onset of injury/illness/exacerbation    Comorbidities TIA 2005, h/o prostate cancer, OA, tinnitus    Examination-Activity  Limitations Transfers;Locomotion Level;Bend;Lift;Reach Overhead    Examination-Participation Restrictions Cleaning;Driving;Interpersonal Relationship;Shop;Community Activity;Meal Prep    Stability/Clinical Decision Making Evolving/Moderate complexity    Rehab Potential Good    PT Frequency 1x / week    PT Duration 6 weeks    PT Treatment/Interventions ADLs/Self Care Home Management;Neuromuscular re-education;Gait training;Therapeutic activities;Therapeutic exercise;Balance training;Patient/family education;Vestibular;Canalith Repostioning    PT Next Visit Plan do FGA:  check HEP - x1 viewing & balance on foam; cont with balance exs with increased vestibular input required    Consulted and Agree with Plan of Care Patient             Patient will benefit from skilled therapeutic intervention in order to improve the following deficits and impairments:  Dizziness, Decreased balance, Difficulty walking  Visit Diagnosis: Unsteadiness on feet  Dizziness and giddiness     Problem List Patient Active Problem List   Diagnosis Date Noted   Hearing loss 05/15/2020   Prostate cancer ([place]) 05/15/2020   Tinnitus 05/15/2020   History of colon polyps 05/15/2020   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   History of bilateral knee arthroplasty 05/15/2020   Renal artery stenosis ([facility]) 05/15/2020   Imbalance 11/29/2019   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   Contusion of chest 10/05/2019   Contusion of right hand 10/05/2019   Fall from, out of or through other building or structure, initial encounter 10/05/2019   Pleurodynia 10/05/2019   Radial styloid tenosynovitis of left hand 09/08/2019   Chronic pain of left wrist 09/05/2019   Acquired spondylolisthesis 08/01/2019   Disorder of arteries and arterioles, 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   Neck pain 08/01/2019   S/P shoulder replacement, right 03/04/2019   Cardiac murmur, unspecified 01/21/2019   Orthostatic  hypotension 01/21/2019   Dizziness 10/15/2018   Malignant melanoma of face excluding eyelid, nose, lip, and ear ([place]) 10/15/2018   Acute non-infective otitis externa 10/04/2018   Diverticula of intestine 06/24/2018   Kidney stone 06/24/2018   Asymptomatic microscopic hematuria 06/14/2018   Elevated blood-pressure reading without diagnosis of hypertension 06/14/2018   Thrombocytopenia ([place]) 06/14/2018   Hardening of the aorta (main artery of the heart) ([place]) 04/16/2018   Shoulder joint pain 12/17/2017   Arthritis of hand 06/24/2017   Benign neoplasm of colon 06/10/2017   Encounter for general adult medical examination without abnormal findings 06/10/2017   Hyperlipidemia 06/10/2017   Pain in finger 06/10/2017   Transient ischemic attack 06/10/2017   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

## 2021-02-25 ENCOUNTER — Other Ambulatory Visit: Payer: Self-pay

## 2021-02-25 ENCOUNTER — Ambulatory Visit: Payer: Medicare Other | Admitting: Physical Therapy

## 2021-02-25 ENCOUNTER — Encounter: Payer: Self-pay | Admitting: Physical Therapy

## 2021-02-25 DIAGNOSIS — R2681 Unsteadiness on feet: Secondary | ICD-10-CM

## 2021-02-25 DIAGNOSIS — R42 Dizziness and giddiness: Secondary | ICD-10-CM

## 2021-02-25 NOTE — Therapy (Signed)
[facility] [address] [place], [place], [address] Phone: [phone]   Fax:  [phone]  Physical Therapy Treatment  Patient Details  Name: Jeffrey Ewing MRN: [id number] Date of Birth: 08/10/1940 Referring Provider (PT): Dr. Crist Infante   Encounter Date: 02/25/2021   PT End of Session - 02/25/21 1247     Visit Number 5    Number of Visits 7    Date for PT Re-Evaluation 03/08/21    Authorization Type [employer] Medicare    Authorization Time Period 01-21-21 - 03-30-21    PT Start Time 1020    PT Stop Time 1058    PT Time Calculation (min) 38 min    Equipment Utilized During Treatment Gait belt    Activity Tolerance Patient tolerated treatment well    Behavior During Therapy [facility] for tasks assessed/performed             Past Medical History:  Diagnosis Date   Atypical nevus 05/04/2013   Left Cheek - Moderate to Severe   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 2013   JOINT REPLACEMENT Bilateral    KNEE ARTHROSCOPY  2009   left   MASS EXCISION Right 04/03/2016   Procedure: EXCISION cyst right index finger abd debridement proximal interphalangeal;  Surgeon: Daryll Brod, MD;  Location: [facility];  Service: Orthopedics;  Laterality: Right;  FAB   PROSTATECTOMY  2008   removal melanoma  2001   left cheek   REVERSE SHOULDER ARTHROPLASTY Right 03/04/2019   Procedure: REVERSE SHOULDER ARTHROPLASTY;  Surgeon: Netta Cedars, MD;  Location: [facility];  Service: Orthopedics;  Laterality: Right;   TONSILLECTOMY  as child   TOTAL KNEE  ARTHROPLASTY Right 12/20/2012   Procedure: RIGHT TOTAL KNEE ARTHROPLASTY;  Surgeon: Gearlean Alf, MD;  Location: [facility];  Service: Orthopedics;  Laterality: Right;   TOTAL KNEE ARTHROPLASTY Bilateral    VASECTOMY  1978    There were no vitals filed for this visit.   Subjective Assessment - 02/25/21 1240     Subjective Pt reports he drove 3 of the 4 hours [place] the beach [place] difficulty last [place] for Thanksgiving.  Pt states he is doing much better - feels balance is better, legs are stronger and he is much more confident; is now tapering off the valium    Diagnostic tests MRI and MRA both negative    Patient Stated Goals Improve balance and reduce vertigo    Currently in Pain? No/denies                [facility] PT Assessment - 02/25/21 1037       Functional Gait  Assessment   Gait assessed  Yes    Gait Level Surface Walks 20 ft in less than 5.5 sec, no assistive devices, good speed, no evidence for imbalance, normal gait pattern, deviates no more than 6 in outside of the 12 in walkway width.    Change in Gait Speed Able to  smoothly change walking speed without loss of balance or gait deviation. Deviate no more than 6 in outside of the 12 in walkway width.    Gait with Horizontal Head Turns Performs head turns smoothly with no change in gait. Deviates no more than 6 in outside 12 in walkway width    Gait with Vertical Head Turns Performs head turns with no change in gait. Deviates no more than 6 in outside 12 in walkway width.    Gait and Pivot Turn Pivot turns safely within 3 sec and stops quickly with no loss of balance.    Step Over Obstacle Is able to step over 2 stacked shoe boxes taped together (9 in total height) without changing gait speed. No evidence of imbalance.    Gait with Narrow Base of Support Is able to ambulate for 10 steps heel to toe with no staggering.    Gait with Eyes Closed Walks 20 ft, no assistive devices, good speed, no evidence of imbalance,  normal gait pattern, deviates no more than 6 in outside 12 in walkway width. Ambulates 20 ft in less than 7 sec.    Ambulating Backwards Walks 20 ft, no assistive devices, good speed, no evidence for imbalance, normal gait    Steps Alternating feet, no rail.    Total Score 30                           [facility] Adult PT Treatment/Exercise - 02/25/21 1037       Transfers   Transfers Sit to Stand;Stand to Sit    Sit to Stand 5: Supervision    Stand to Sit 5: Supervision    Number of Reps 10 reps    Comments feet on [employer]ire[employer] - no UE support used from mat; pt had no LOB      Self-Care   Self-Care Other Self-Care Comments    Other Self-Care Comments  Discussed LTG's and progress - pt completed FOTO survey and results from initial were compared and explained to pt                       PT Short Term Goals - 02/25/21 1248       PT SHORT TERM GOAL #1   Title Pt will participate in assessment of FGA    Time 3    Period Weeks    Status On-going    Target Date 02/15/21      PT SHORT TERM GOAL #2   Title Initiate balance/vestibular HEP.    Baseline met 02-04-21    Time 3    Period Weeks    Status Achieved    Target Date 02/15/21      PT SHORT TERM GOAL #3   Title Pt will verbalize understanding of diet recommendations for Meniere's disease management.    Baseline met 02-04-21    Time 3    Period Weeks    Status Achieved    Target Date 02/15/21               PT Long Term Goals - 02/25/21 1041       PT LONG TERM GOAL #1   Title Pt will demonstrate independence with final vestibular/balance HEP    Time 6    Period Weeks    Status Achieved      PT LONG TERM GOAL #2   Title Pt will increase FOTO DFS score from 56 to >/=  61 to demo improvement in vertigo.    Baseline DFS 56;  DPS 60.7;   score 67.4 DFS on 02-25-21    Time 6    Period Weeks    Status Achieved      PT LONG TERM GOAL #3   Title Pt will increase FGA score by 4 points to indicate  decreased falls risk    Baseline score 30/30 on 02-25-21    Time 6    Period Weeks    Status Achieved      PT LONG TERM GOAL #4   Title Pt will report improvement in balance/stability with ability to pick up grandchildren from school with improved confidence and less anxiety.    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   Imbalance 11/29/2019   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   Contusion of chest 10/05/2019   Contusion of right hand 10/05/2019   Fall from, out of or through other building or structure, initial encounter 10/05/2019   Pleurodynia 10/05/2019   Radial styloid tenosynovitis of left hand 09/08/2019   Chronic pain of left wrist 09/05/2019   Acquired spondylolisthesis 08/01/2019   Disorder of arteries and arterioles, unspecified ([place]) 08/01/2019   Neck pain 08/01/2019   S/P shoulder replacement, right 03/04/2019   Cardiac murmur, unspecified 01/21/2019   Orthostatic hypotension 01/21/2019   Dizziness 10/15/2018   Malignant melanoma of face excluding eyelid, nose, lip, and ear ([place]) 10/15/2018   Acute non-infective otitis externa 10/04/2018   Diverticula of intestine 06/24/2018   Kidney stone 06/24/2018   Asymptomatic microscopic hematuria 06/14/2018   Elevated blood-pressure reading without diagnosis of hypertension 06/14/2018   Thrombocytopenia ([place]) 06/14/2018    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   Shoulder joint pain 12/17/2017   Arthritis of hand 06/24/2017   Benign neoplasm of colon 06/10/2017   Encounter for general adult medical examination without abnormal findings 06/10/2017   Hyperlipidemia 06/10/2017   Pain in finger 06/10/2017   Transient ischemic attack 06/10/2017   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  Pt denies feelings of dizziness.   Education / Equipment: Pt has been instructed in HEP for balance/vestibular exercises and also in strengthening exercises for bil. LE's -  pt reports compliance with this HEP.  Patient agrees to discharge. Patient goals were met. Patient is being discharged due to meeting the stated rehab goals.    Alda Lea, PT 02/25/2021, 12:53 PM  [facility] [address] [place], [place], [address] Phone: [phone]   Fax:  [phone]  Name: Jeffrey Ewing MRN: [id number] Date of Birth: [birth year]

## 2021-03-04 ENCOUNTER — Encounter: Payer: Medicare Other | Admitting: Physical Therapy

## 2021-03-11 ENCOUNTER — Encounter: Payer: Medicare Other | Admitting: Physical Therapy

## 2021-04-16 ENCOUNTER — Emergency Department (HOSPITAL_BASED_OUTPATIENT_CLINIC_OR_DEPARTMENT_OTHER): Payer: Medicare Other | Admitting: Radiology

## 2021-04-16 ENCOUNTER — Emergency Department (HOSPITAL_BASED_OUTPATIENT_CLINIC_OR_DEPARTMENT_OTHER)
Admission: EM | Admit: 2021-04-16 | Discharge: 2021-04-16 | Disposition: A | Discharge status: Home / Self Care | Payer: Medicare Other | Attending: Emergency Medicine | Admitting: Emergency Medicine

## 2021-04-16 ENCOUNTER — Other Ambulatory Visit: Payer: Self-pay

## 2021-04-16 DIAGNOSIS — K2901 Acute gastritis with bleeding: Secondary | ICD-10-CM | POA: Insufficient documentation

## 2021-04-16 DIAGNOSIS — R079 Chest pain, unspecified: Secondary | ICD-10-CM | POA: Diagnosis present

## 2021-04-16 DIAGNOSIS — K2900 Acute gastritis without bleeding: Secondary | ICD-10-CM

## 2021-04-16 LAB — BASIC METABOLIC PANEL
Anion gap: 11 (ref 5–15)
BUN: 17 mg/dL (ref 8–23)
CO2: 24 mmol/L (ref 22–32)
Calcium: 9.1 mg/dL (ref 8.9–10.3)
Chloride: 100 mmol/L (ref 98–111)
Creatinine, Ser: 1.08 mg/dL (ref 0.61–1.24)
GFR, Estimated: >60 mL/min (ref >60)
Glucose, Bld: 90 mg/dL (ref 70–99)
Potassium: 4.2 mmol/L (ref 3.5–5.1)
Sodium: 135 mmol/L (ref 135–145)

## 2021-04-16 LAB — CBC
HCT: 40.3 % (ref 39.0–52.0)
Hemoglobin: 14 g/dL (ref 13.0–17.0)
MCH: 36.5 pg — ABNORMAL HIGH (ref 26.0–34.0)
MCHC: 34.7 g/dL (ref 30.0–36.0)
MCV: 104.9 fL — ABNORMAL HIGH (ref 80.0–100.0)
Platelets: 115 10*3/uL — ABNORMAL LOW (ref 150–400)
RBC: 3.84 MIL/uL — ABNORMAL LOW (ref 4.22–5.81)
RDW: 13.2 % (ref 11.5–15.5)
WBC: 9.6 10*3/uL (ref 4.0–10.5)
nRBC: 0 % (ref 0.0–0.2)

## 2021-04-16 LAB — TROPONIN I (HIGH SENSITIVITY): Troponin I (High Sensitivity): 5 ng/L (ref <18)

## 2021-04-16 MED ORDER — LIDOCAINE VISCOUS HCL 2 % MT SOLN
15.0000 mL | Freq: Once | OROMUCOSAL | Status: AC
  Administered 2021-04-16: 15 mL via ORAL
  Filled 2021-04-16: qty 15

## 2021-04-16 MED ORDER — ALUM & MAG HYDROXIDE-SIMETH 200-200-20 MG/5ML PO SUSP
30.0000 mL | Freq: Once | ORAL | Status: AC
  Administered 2021-04-16: 30 mL via ORAL
  Filled 2021-04-16: qty 30

## 2021-04-16 MED ORDER — FAMOTIDINE 20 MG PO TABS: 20.0000 mg | ORAL_TABLET | Freq: Two times a day (BID) | ORAL | 0 refills | Status: DC

## 2021-04-16 NOTE — ED Provider Notes (Signed)
[facility] EMERGENCY DEPT Provider Note   CSN: [id number] Arrival date & time: 04/16/21  1443     History  Chief Complaint  Patient presents with   Chest Pain    Jeffrey Ewing is a 81 y.o. male.   Chest Pain  81 year old male presenting to the emergency department with a chief complaint of epigastric pain and chest pain.  The patient states the pain has been intermittent for the past 2 weeks.  He initially thought it was gas pain.  It is located in his epigastrium and radiates up to his chest.  It feels like a component of burning.  He states that he was recently started on NSAIDs for metatarsalgia of his foot.  He has been taking NSAIDs fairly regularly for the last 2 weeks.  He does feel like there is a correlation between taking the pills and his discomfort.  He denies any shortness of breath.  He denies any fevers or chills.  Home Medications Prior to Admission medications   Medication Sig Start Date End Date Taking? Authorizing Provider  CRESTOR 20 MG tablet Take 20 mg by mouth daily.  10/03/11  Yes [provider]  diazepam (VALIUM) 2 MG tablet Take 1 tablet (2 mg total) by mouth every 12 (twelve) hours as needed. 12/04/20  Yes Camara, Maryan Puls, MD  dipyridamole-aspirin (AGGRENOX) 200-25 MG per 12 hr capsule Take 1 capsule by mouth 2 (two) times daily.   Yes [provider]  ezetimibe (ZETIA) 10 MG tablet TAKE 1 TABLET(10 MG) BY MOUTH DAILY 11/02/19  Yes Adrian Prows, MD  famotidine (PEPCID) 20 MG tablet Take 1 tablet (20 mg total) by mouth 2 (two) times daily. 04/16/21  Yes Regan Lemming, MD  meclizine (ANTIVERT) 25 MG tablet Take 25 mg by mouth 3 (three) times daily as needed for dizziness.   Yes [provider]      Allergies    Patient has no known allergies.    Review of Systems   Review of Systems  Cardiovascular:  Positive for chest pain.  All other systems reviewed and are negative.  Physical Exam Updated Vital Signs BP (!)  157/95    Pulse 82    Temp 97.6 F (36.4 C)    Resp 20    Ht 5\' 10"  (1.778 m)    Wt 88.5 kg    SpO2 97%    BMI 27.99 kg/m  Physical Exam Vitals and nursing note reviewed.  Constitutional:      General: He is not in acute distress.    Appearance: He is well-developed.  HENT:     Head: Normocephalic and atraumatic.  Eyes:     Conjunctiva/sclera: Conjunctivae normal.     Pupils: Pupils are equal, round, and reactive to light.  Cardiovascular:     Rate and Rhythm: Normal rate and regular rhythm.     Heart sounds: No murmur heard. Pulmonary:     Effort: Pulmonary effort is normal. No respiratory distress.     Breath sounds: Normal breath sounds.  Chest:     Comments: No chest wall tenderness Abdominal:     General: There is no distension.     Palpations: Abdomen is soft.     Tenderness: There is no abdominal tenderness. There is no guarding.  Musculoskeletal:        General: No swelling, deformity or signs of injury.     Cervical back: Neck supple.  Skin:    General: Skin is warm and dry.  Capillary Refill: Capillary refill takes less than 2 seconds.     Findings: No lesion or rash.  Neurological:     General: No focal deficit present.     Mental Status: He is alert. Mental status is at baseline.  Psychiatric:        Mood and Affect: Mood normal.    ED Results / Procedures / Treatments   Labs (all labs ordered are listed, but only abnormal results are displayed) Labs Reviewed  CBC - Abnormal; Notable for the following components:      Result Value   RBC 3.84 (*)    MCV 104.9 (*)    MCH 36.5 (*)    Platelets 115 (*)    All other components within normal limits  BASIC METABOLIC PANEL  TROPONIN I (HIGH SENSITIVITY)    EKG EKG Interpretation  Date/Time:  Tuesday April 16 2021 14:53:36 EST Ventricular Rate:  95 PR Interval:  140 QRS Duration: 88 QT Interval:  350 QTC Calculation: 439 R Axis:   53 Text Interpretation: Normal sinus rhythm Normal ECG When  compared with ECG of 04-Mar-2019 07:54, No significant change was found Confirmed by Regan Lemming ([id number]) on 04/16/2021 5:10:22 PM  Radiology DG Chest 2 View  Result Date: 04/16/2021 CLINICAL DATA:  Chest pain EXAM: CHEST - 2 VIEW COMPARISON:  Chest x-ray 12/14/2012 FINDINGS: Heart size and mediastinal contours are within normal limits. Lungs are hyperinflated. No suspicious pulmonary opacities identified. No pleural effusion or pneumothorax visualized. No acute osseous abnormality appreciated. IMPRESSION: No acute intrathoracic process identified.  COPD. Electronically Signed   By: Ofilia Neas M.D.   On: 04/16/2021 15:27    Procedures Procedures    Medications Ordered in ED Medications  alum & mag hydroxide-simeth (MAALOX/MYLANTA) 200-200-20 MG/5ML suspension 30 mL (30 mLs Oral Given 04/16/21 1728)    And  lidocaine (XYLOCAINE) 2 % viscous mouth solution 15 mL (15 mLs Oral Given 04/16/21 1728)    ED Course/ Medical Decision Making/ A&P                           Medical Decision Making Amount and/or Complexity of Data Reviewed Labs: ordered. Radiology: ordered.  Risk OTC drugs. Prescription drug management.   81 year old male presenting to the emergency department with a chief complaint of epigastric pain and chest pain.  The patient states the pain has been intermittent for the past 2 weeks.  He initially thought it was gas pain.  It is located in his epigastrium and radiates up to his chest.  It feels like a component of burning.  He states that he was recently started on NSAIDs for metatarsalgia of his foot.  He has been taking NSAIDs fairly regularly for the last 2 weeks.  He does feel like there is a correlation between taking the pills and his discomfort.  He denies any shortness of breath.  He denies any fevers or chills.  On arrival, the patient was afebrile and hemodynamically stable, saturating well on room air.  On my evaluation of telemetry initially presented with  sinus tachycardia to 102 but this resolved to normal sinus rhythm without intervention.  Patient's symptoms have been ongoing for the past few weeks.  Symptoms are consistent with most likely gastritis/gastroesophageal reflux in the setting of NSAID use.  He was administered viscous Maalox and lidocaine.  He denies any active chest pain at this time.  Given the duration of symptoms, single troponin was collected and  resulted normal at 5.  CBC was without a leukocytosis or anemia BMP was unremarkable.  An EKG revealed normal sinus rhythm  without ischemic changes and a chest x-ray was reviewed by myself and radiology and revealed no acute intrathoracic process.  Low suspicion for ACS, PE, Boerhaave syndrome, pneumonia, pneumothorax, pancreatitis, cholelithiasis, SBO based on history, physical exam and testing.  Discussed the patient's NSAID use.  Discussed the likely diagnosis of gastritis from NSAID use.  The patient would like to trial a course of Pepcid.  He is only taking the NSAIDs for 2 more weeks.  Will trial Pepcid, advised Tums and Maalox OTC as needed and follow-up outpatient.  Stable for discharge.   Final Clinical Impression(s) / ED Diagnoses Final diagnoses:  Acute gastritis, presence of bleeding unspecified, unspecified gastritis type    Rx / DC Orders ED Discharge Orders          Ordered    famotidine (PEPCID) 20 MG tablet  2 times daily        04/16/21 1722              Regan Lemming, MD 04/18/21 1238

## 2021-04-16 NOTE — ED Triage Notes (Signed)
Chest pain x 2 weeks, random pain on left upper chest that he thought was gas pain. PCP sent pt to ED. States it feels like a pulled muscle and pain does not radiate. Some nausea, no v/d but believes it is due to his vertigo. Lightheadedness & headaches. No vision changes.

## 2021-04-16 NOTE — Discharge Instructions (Addendum)
You were evaluated in the Emergency Department and after careful evaluation, we did not find any emergent condition requiring admission or further testing in the hospital.  Your exam/testing today was overall reassuring.  Your EKG was unremarkable as was your chest x-ray.  Your initial cardiac enzyme, troponin was normal.  Given the duration of symptoms, repeat cardiac enzyme is not warranted.  Your symptoms are likely due to gastritis which is likely being induced by your meloxicam use.  Will prescribe Pepcid.  Also recommend using over-the-counter Tums and Maalox as needed.  Please return to the Emergency Department if you experience any worsening of your condition.  Thank you for allowing [place] to be a part of your care.

## 2021-04-16 NOTE — ED Notes (Signed)
Pt discharged to home. Discharge instructions have been discussed with patient and/or family members. Pt verbally acknowledges understanding d/c instructions, and endorses comprehension to checkout at registration before leaving.  °

## 2021-04-16 NOTE — ED Notes (Signed)
NOt repeating troponin per EDP [place]

## 2021-06-05 ENCOUNTER — Encounter: Payer: Self-pay | Admitting: Neurology

## 2021-06-05 ENCOUNTER — Ambulatory Visit: Payer: Medicare Other | Admitting: Neurology

## 2021-06-05 VITALS — BP 123/76 | HR 92 | Wt 195.0 lb

## 2021-06-05 DIAGNOSIS — G629 Polyneuropathy, unspecified: Secondary | ICD-10-CM | POA: Diagnosis not present

## 2021-06-05 DIAGNOSIS — H8109 Meniere's disease, unspecified ear: Secondary | ICD-10-CM

## 2021-06-05 NOTE — Patient Instructions (Signed)
Continue current medication ?We will check TSH and B12 level ?Follow-up with the primary care doctor ?Return in 1 year or sooner if worse. ?

## 2021-06-05 NOTE — Progress Notes (Signed)
[facility]  PATIENT: Jeffrey Ewing DOB: 04-27-1940  REFERRING CLINICIAN: Crist Infante, MD HISTORY FROM: Patient  REASON FOR VISIT: Vertigo    HISTORICAL  CHIEF COMPLAINT:  Chief Complaint  Patient presents with   Follow-up    Rm 14, alone 6 mo fu, c/o daily dizziness, pt stopped Valium and meclizine due to his own research    INTERVAL HISTORY 06/05/2021 Patient presents today for follow-up, at last visit plan was to start low-dose Valium to help with the episode of vertigo.  He reported discontinued Valium after 1 month due to potential side effects.  Currently he is not taking any medication for his symptoms.  Patient denies any room spinning sensation but states that his symptoms feel like its unsteadiness that is constant 24/7.  He denies any history of neuropathy, denies any history of foot pain.   HISTORY OF PRESENT ILLNESS:  This is a 81 year old gentleman with past medical history of vertigo, hyperlipidemia, TIA who is presenting for recurrent vertigo.  Patient states the vertigo started in July 2020.  At that time, he described as spinning sensation without any nausea or vomiting.  He had schedule an appointment with ENT but unfortunately had shoulder injury requiring surgical repair.  After having his shoulder repaired, he reported that his dizziness improve and he was doing well until late summer in 2021 when the dizziness recur.  At that time he described the symptoms as being unsteady like he was on the boat, denies any spinning sensation.  Since summer 2021 he has seen 2 ENT, has seen audiologist, had MRI brain and MRA of head which was all normal, no evidence of intracranial pathology that can explain his recurrent dizziness.  He did follow with audiology and was told that he has hearing loss, he has hearing aids currently.  He also reported intermittent tinnitus, gets fatigue easily.  He mentioned that he was prescribed meclizine but in fact, he has  never used it for his dizziness. Currently when he had acute episode of dizziness he will lay down and wait for the symptoms to pass.  He reported the dizziness has caused "extreme anxiety, it is bothersome and limit what he can do.  In the month of July he had 2 episode 1 episode he was picking heavy things while on a boat and the second episode that happened yesterday he picked up a child up to pick a leaf from the tree then had acute dizziness.  He reported that his dizziness can last up to 3 to 4 hours, again no falls associated with his dizziness. He has completed vestibular rehab in the past which provided some relief but dizziness still persistent.      OTHER MEDICAL CONDITIONS: TIA, Melanoma, HLD, history of kidney stone.    REVIEW OF SYSTEMS: Full 14 system review of systems performed and negative with exception of: as noted in the HPI  ALLERGIES: No Known Allergies  HOME MEDICATIONS: Outpatient Medications Prior to Visit  Medication Sig Dispense Refill   CRESTOR 20 MG tablet Take 20 mg by mouth daily.      dipyridamole-aspirin (AGGRENOX) 200-25 MG per 12 hr capsule Take 1 capsule by mouth 2 (two) times daily.     ezetimibe (ZETIA) 10 MG tablet TAKE 1 TABLET(10 MG) BY MOUTH DAILY 90 tablet 1   diazepam (VALIUM) 2 MG tablet Take 1 tablet (2 mg total) by mouth every 12 (twelve) hours as needed. 30 tablet 3   famotidine (PEPCID) 20 MG  tablet Take 1 tablet (20 mg total) by mouth 2 (two) times daily. 30 tablet 0   meclizine (ANTIVERT) 25 MG tablet Take 25 mg by mouth 3 (three) times daily as needed for dizziness.     No facility-administered medications prior to visit.    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 2013   JOINT REPLACEMENT Bilateral    KNEE ARTHROSCOPY  2009   left   MASS EXCISION Right 04/03/2016   Procedure: EXCISION cyst right index finger abd debridement proximal interphalangeal;  Surgeon: Daryll Brod, MD;  Location: [facility];  Service: Orthopedics;  Laterality: Right;  FAB   PROSTATECTOMY  2008   removal melanoma  2001   left cheek   REVERSE SHOULDER ARTHROPLASTY Right 03/04/2019   Procedure: REVERSE SHOULDER ARTHROPLASTY;  Surgeon: Netta Cedars, MD;  Location: [facility];  Service: Orthopedics;  Laterality: Right;   TONSILLECTOMY  as child   TOTAL KNEE ARTHROPLASTY Right 12/20/2012   Procedure: RIGHT TOTAL KNEE ARTHROPLASTY;  Surgeon: Gearlean Alf, MD;  Location: [facility];  Service: Orthopedics;  Laterality: Right;   TOTAL KNEE ARTHROPLASTY Bilateral    VASECTOMY  1978    FAMILY HISTORY: Family History  Problem Relation Age of Onset   Alzheimer's disease Mother    Heart attack Father    Hypertension Father    CAD Father    Colon cancer Neg Hx    Stomach cancer Neg Hx    Esophageal cancer Neg Hx    Rectal cancer Neg Hx     SOCIAL HISTORY: Social History   Socioeconomic History   Marital status: Married    Spouse name: Not on file   Number of children: 3   Years of education: Not on file   Highest education level: Not on file  Occupational History   Not on file  Tobacco Use   Smoking status: Former    Packs/day: 1.00    Years: 41.00    Pack years: 41.00    Types: Cigarettes    Quit date: 11/05/2003    Years since quitting: 17.5   Smokeless tobacco: Never  Vaping Use   Vaping Use: Never used  Substance and Sexual Activity   Alcohol use: No   Drug use: No   Sexual activity: Not on file  Other Topics  Concern   Not on file  Social History Narrative   Not on file   Social Determinants of Health   Financial Resource Strain: Not on file  Food Insecurity: Not on file  Transportation Needs: Not on file  Physical Activity: Not on file  Stress: Not on file  Social Connections: Not on file  Intimate Partner Violence: Not on file     PHYSICAL EXAM  GENERAL EXAM/CONSTITUTIONAL: Vitals:  Vitals:   06/05/21 0903  BP: 123/76  Pulse: 92  Weight: 195 lb (  88.5 kg)   Body mass index is 27.98 kg/m. Wt Readings from Last 3 Encounters:  06/05/21 195 lb (88.5 kg)  04/16/21 195 lb 1.7 oz (88.5 kg)  12/04/20 195 lb (88.5 kg)   Patient is in no distress; well developed, nourished and groomed; neck is supple  CARDIOVASCULAR: Examination of carotid arteries is normal; no carotid bruits Regular rate and rhythm, no murmurs Examination of peripheral vascular system by observation and palpation is normal  EYES: Pupils round and reactive to light, Visual fields full to confrontation, Extraocular movements intacts,   MUSCULOSKELETAL: Gait, strength, tone, movements noted in Neurologic exam below  NEUROLOGIC: MENTAL STATUS: awake, alert, oriented to person, place and time recent and remote memory intact normal attention and concentration language fluent, comprehension intact, naming intact fund of knowledge appropriate  CRANIAL NERVE:  2nd, 3rd, 4th, 6th - pupils equal and reactive to light, visual fields full to confrontation, extraocular muscles intact, no nystagmus 5th - facial sensation symmetric 7th - facial strength symmetric 8th - hearing intact 9th - palate elevates symmetrically, uvula midline 11th - shoulder shrug symmetric 12th - tongue protrusion midline  MOTOR:  normal bulk and tone, full strength in the BUE, BLE  SENSORY:  Decrease vibration and pinprick in the bilateral feet.   COORDINATION:  finger-nose-finger, fine finger movements normal  REFLEXES:  deep  tendon reflexes present and symmetric  GAIT/STATION:  normal  DIAGNOSTIC DATA (LABS, IMAGING, TESTING) - I reviewed patient records, labs, notes, testing and imaging myself where available.  Lab Results  Component Value Date   WBC 9.6 04/16/2021   HGB 14.0 04/16/2021   HCT 40.3 04/16/2021   MCV 104.9 (H) 04/16/2021   PLT 115 (L) 04/16/2021      Component Value Date/Time   NA 135 04/16/2021 1501   K 4.2 04/16/2021 1501   CL 100 04/16/2021 1501   CO2 24 04/16/2021 1501   GLUCOSE 90 04/16/2021 1501   BUN 17 04/16/2021 1501   CREATININE 1.08 04/16/2021 1501   CALCIUM 9.1 04/16/2021 1501   PROT 6.5 04/19/2020 1038   ALBUMIN 4.4 04/19/2020 1038   AST 27 04/19/2020 1038   ALT 20 04/19/2020 1038   ALKPHOS 75 04/19/2020 1038   BILITOT 1.0 04/19/2020 1038   GFRNONAA >60 04/16/2021 1501   GFRAA >60 03/05/2019 0221   No results found for: CHOL, HDL, LDLCALC, LDLDIRECT, TRIG, CHOLHDL No results found for: HGBA1C No results found for: VITAMINB12 No results found for: TSH   MRI/MRA 05/15/2020:  1. No evidence of acute intracranial abnormality. 2. A few scattered foci of T2 hyperintensity are seen within the white matter of the cerebral hemispheres, nonspecific. This may represent mild chronic small vessel ischemic disease. 3. Normal MRA of the head.   ASSESSMENT AND PLAN  81 y.o. year old male with past medical history of lipidemia, TIA who is presenting for follow-up for with recurrent dizziness for the past 2 years.  He has tried Valium for a month and a half without clear benefit and because of possible side effect he discontinued it.  On today's exam he did complain of some abnormal sensation in his feet.  He does have decreased sensation to vibration all the way to the ankle and decreased pinprick consistent with peripheral neuropathy.  I have explained to the patient that he might have two conditions going on at the same time.  The vertigo likely Mnire's disease that is  causing the room spinning sensation and the peripheral neuropathy that is  causing the unsteadiness that is constant.  He denies lately having any room spinning sensation therefore I told him that most likely the feeling of unsteadiness is from his peripheral neuropathy.  He does not complain of any pain, I will obtain a vitamin B12 level and TSH.  I will contact the patient to go over the result otherwise I will see him in 1 year for follow-up.  I also advised him to contact me if his symptoms are worse or any other new concern.    1. Neuropathy   2. Meniere's disease, unspecified laterality     Patient Instructions  Continue current medication We will check TSH and B12 level Follow-up with the primary care doctor Return in 1 year or sooner if worse.   Orders Placed This Encounter  Procedures   TSH   Vitamin B12     No orders of the defined types were placed in this encounter.   Return in about 1 year (around 06/06/2022).  I have spent a total of 30 minutes dedicated to this patient today, preparing to see patient, performing a medically appropriate examination and evaluation, ordering tests and/or medications and procedures, and counseling and educating the patient/family/caregiver; independently interpreting result and communicating results to the family/patient/caregiver; and documenting clinical information in the electronic medical record.   Alric Ran, MD 06/05/2021, 12:44 PM  [facility] [address], [place], [place] [address] [phone]

## 2021-06-06 LAB — VITAMIN B12: Vitamin B-12: 639 pg/mL (ref 232–1245)

## 2021-06-06 LAB — TSH: TSH: 2.03 u[IU]/mL (ref 0.450–4.500)

## 2021-10-11 ENCOUNTER — Other Ambulatory Visit (HOSPITAL_COMMUNITY): Payer: Self-pay | Admitting: Internal Medicine

## 2021-10-11 DIAGNOSIS — I701 Atherosclerosis of renal artery: Secondary | ICD-10-CM

## 2021-10-16 ENCOUNTER — Ambulatory Visit (HOSPITAL_COMMUNITY)
Admission: RE | Admit: 2021-10-16 | Discharge: 2021-10-16 | Disposition: A | Discharge status: Home / Self Care | Payer: Medicare Other | Source: Ambulatory Visit | Attending: Cardiovascular Disease | Admitting: Cardiovascular Disease

## 2021-10-16 DIAGNOSIS — I701 Atherosclerosis of renal artery: Secondary | ICD-10-CM | POA: Insufficient documentation

## 2021-10-16 IMAGING — CT CT ANGIO ABDOMEN
2 of 5 series · 15 of 46 positions shown, 18 images · IV contrast (OMNI)
Comparison: June 18, 2018.

CLINICAL DATA: Abdominal mass.

EXAM:
CT ANGIOGRAPHY ABDOMEN
TECHNIQUE: Multidetector CT imaging of the abdomen was performed using the
standard protocol during bolus administration of intravenous
contrast. Multiplanar reconstructed images and MIPs were obtained
and reviewed to evaluate the vascular anatomy.
CONTRAST:  100mL OMNIPAQUE IOHEXOL 350 MG/ML SOLN

[Series 5: dissection 3.0 i30f 3 · axial · 0.87mm/px · z∈[+835,+1246]mm · 12 of 152 slices shown, 15 images]
[im 10/152  soft-tissue]
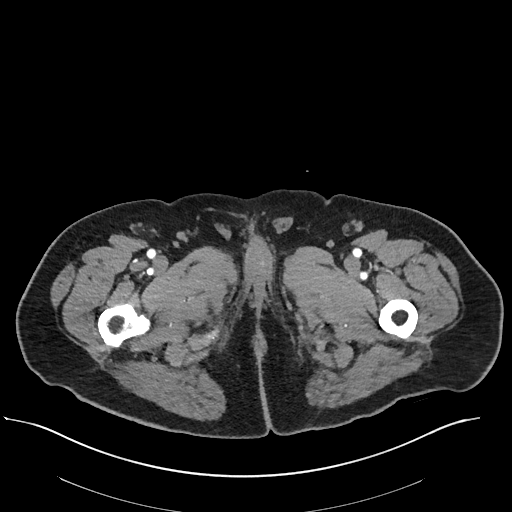
[im 10/152  bone]
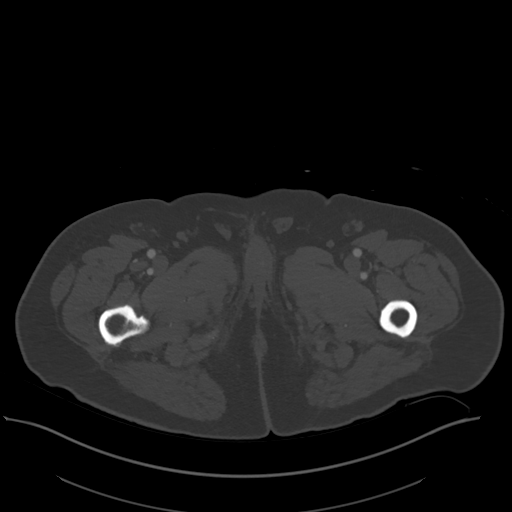
[im 30/152  soft-tissue]
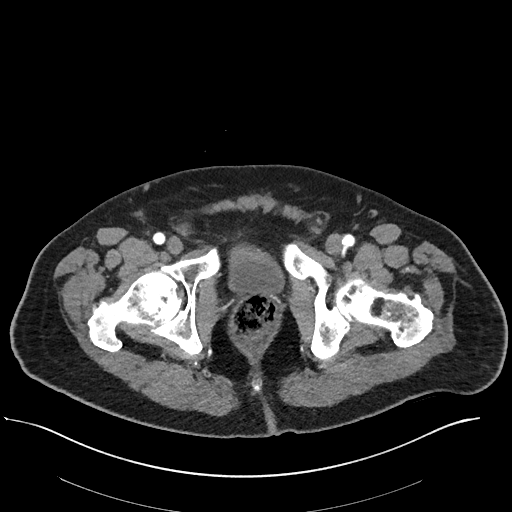
[im 44/152  soft-tissue]
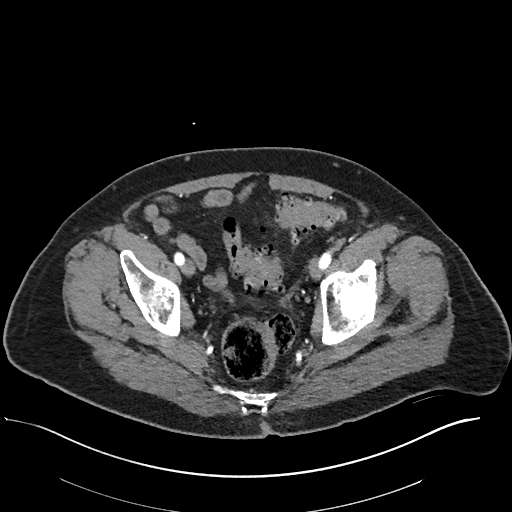
[im 59/152  soft-tissue]
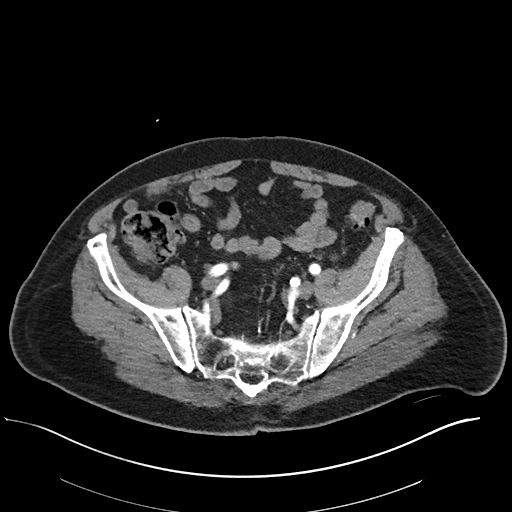
[im 78/152  soft-tissue]
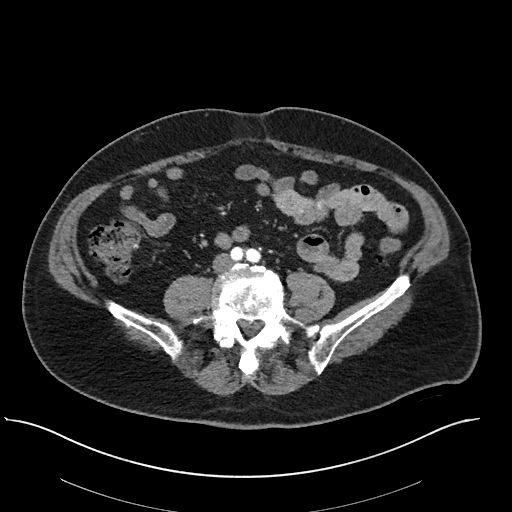
[im 93/152  soft-tissue]
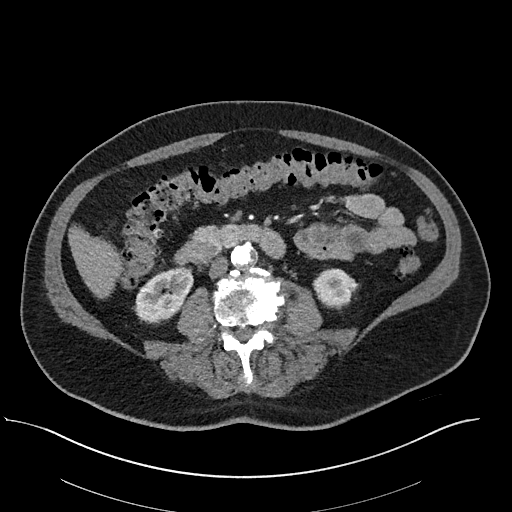
[im 108/152  soft-tissue]
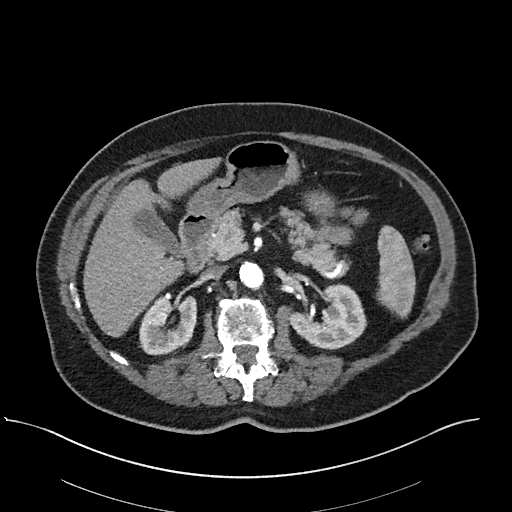
[im 127/152  soft-tissue]
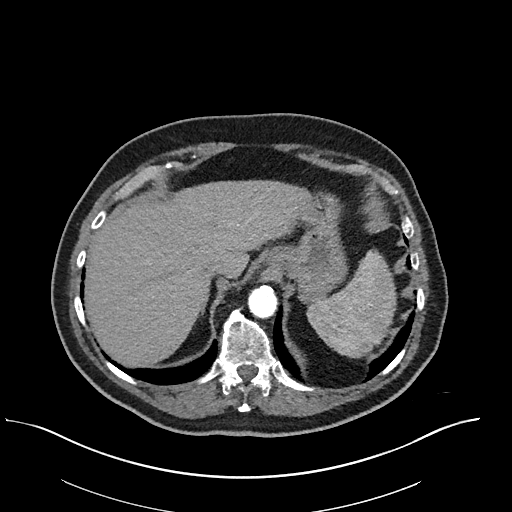
[im 132/152  lung]
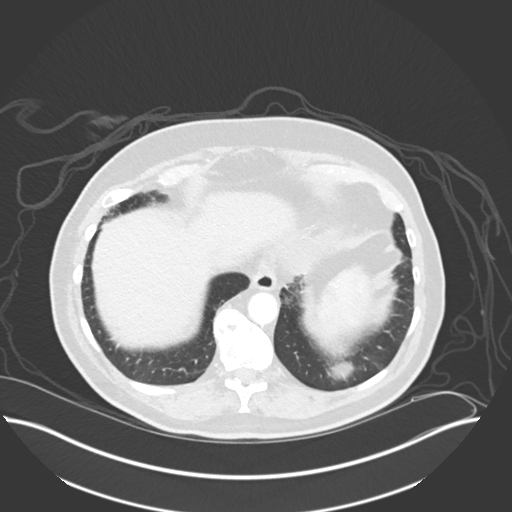
[im 137/152  lung]
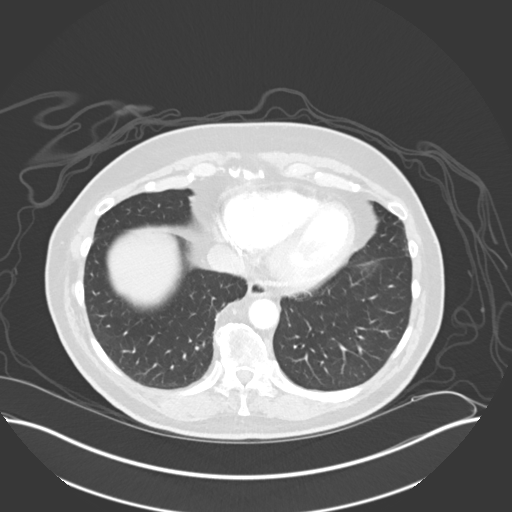
[im 142/152  soft-tissue]
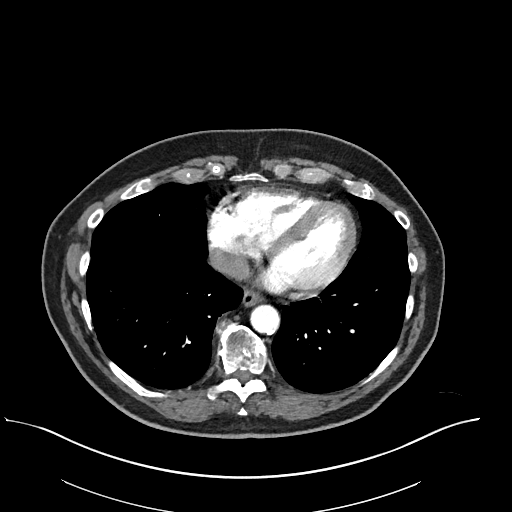
[im 142/152  lung]
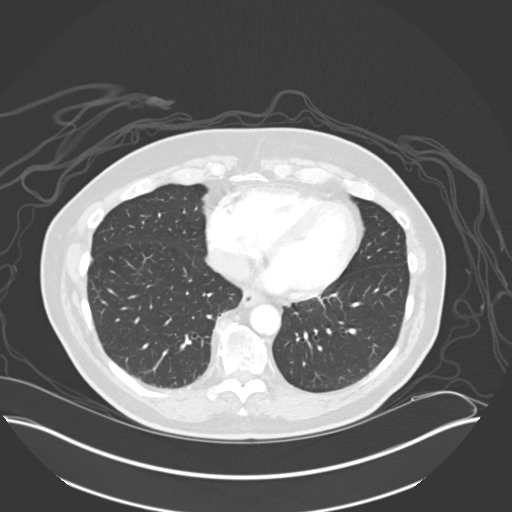
[im 142/152  bone]
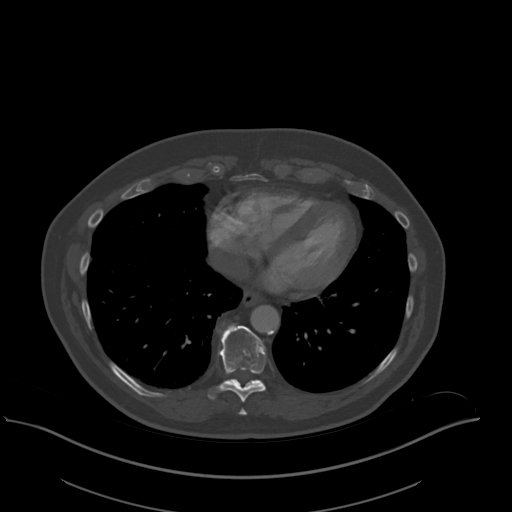
[im 147/152  lung]
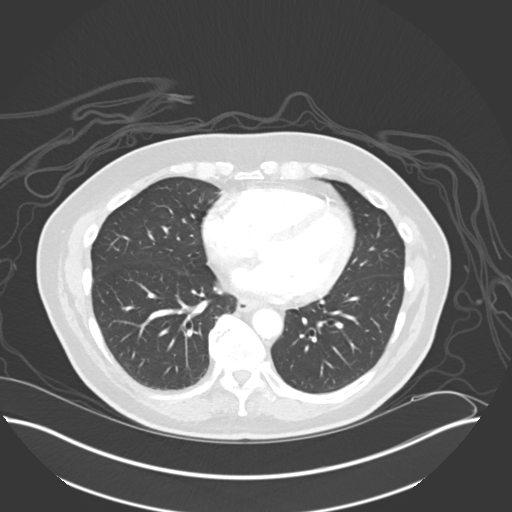

[Series 8: coronals · coronal · 0.79mm/px · 3 of 142 slices shown]
[im 36/142  soft-tissue]
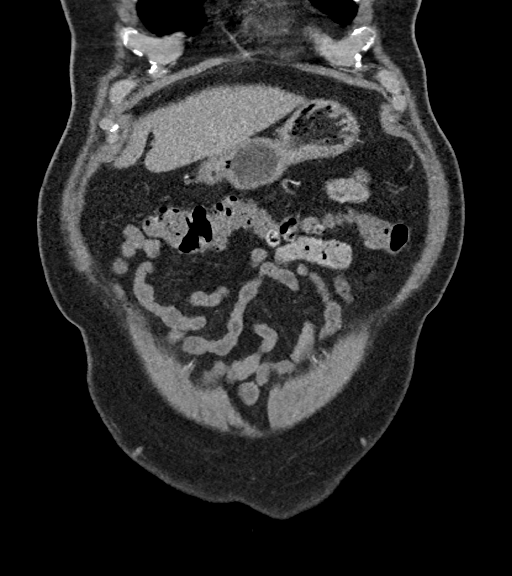
[im 71/142  soft-tissue]
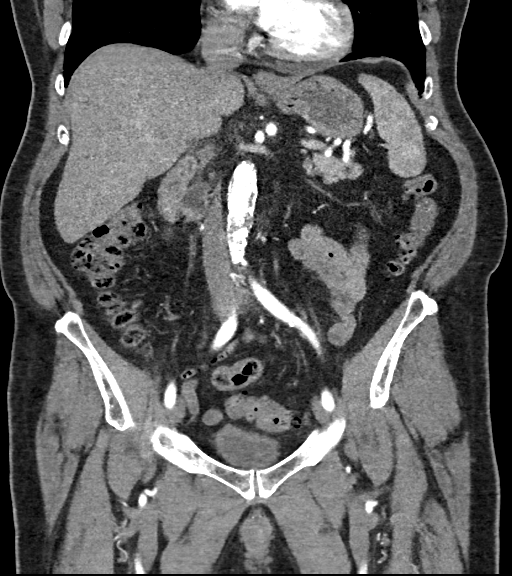
[im 106/142  soft-tissue]
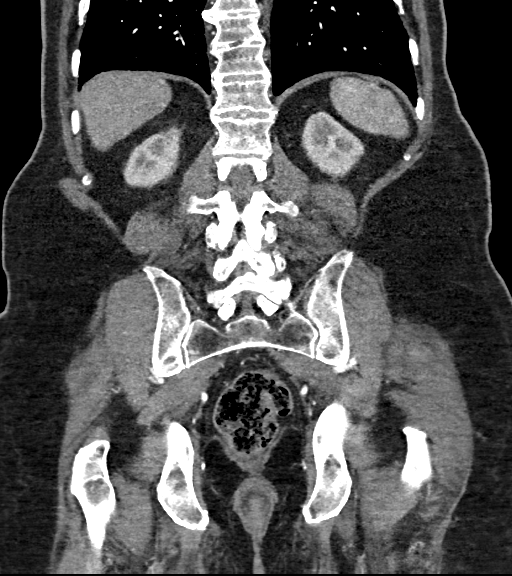

[15 of 46 positions shown; findings below may reference images not displayed]

FINDINGS: VASCULAR

Aorta: Atherosclerosis of abdominal aorta is noted without aneurysm
or acute dissection.

Celiac: Patent without evidence of aneurysm, dissection, vasculitis
or significant stenosis.

SMA: Mild stenosis is noted at origin secondary to calcified plaque.
No thrombus is noted.

Renals: Left renal artery is widely patent. Severe stenosis is noted
at origin of right renal artery secondary to eccentric calcified
plaque. No thrombus is noted.

IMA: Patent without evidence of aneurysm, dissection, vasculitis or
significant stenosis.

Inflow: Patent without evidence of aneurysm, dissection, vasculitis
or significant stenosis.

Veins: No obvious venous abnormality within the limitations of this
arterial phase study.

Review of the MIP images confirms the above findings.

NON-VASCULAR

Lower chest: No acute abnormality.

Hepatobiliary: No focal liver abnormality is seen. No gallstones,
gallbladder wall thickening, or biliary dilatation.

Pancreas: Unremarkable. No pancreatic ductal dilatation or
surrounding inflammatory changes.

Spleen: Normal in size without focal abnormality.

Adrenals/Urinary Tract: Adrenal glands are unremarkable. Kidneys are
normal, without renal calculi, focal lesion, or hydronephrosis.
Bladder is unremarkable.

Stomach/Bowel: The stomach appears normal. There is no evidence of
bowel obstruction or inflammation. The appendix appears normal.
Sigmoid diverticulosis is noted without inflammation.

Lymphatic: No adenopathy is noted.

Other: No abdominal wall hernia or abnormality. No abdominopelvic
ascites.

Musculoskeletal: Multilevel degenerative disc disease is noted in
the lumbar spine. No acute abnormality is noted.
IMPRESSION: VASCULAR:

Atherosclerosis of abdominal aorta is noted without aneurysm or
acute dissection.

Severe stenosis is noted at origin of right renal artery secondary
to eccentric calcified plaque. No thrombus is noted.

NON-VASCULAR:

Sigmoid diverticulosis without inflammation.

Multilevel degenerative disc disease is noted in the lumbar spine.

Aortic Atherosclerosis ([id number]).

## 2022-06-09 ENCOUNTER — Encounter: Payer: Self-pay | Admitting: Neurology

## 2022-06-09 ENCOUNTER — Ambulatory Visit: Payer: Medicare Other | Admitting: Neurology

## 2022-06-09 VITALS — BP 135/70 | HR 71 | Ht 70.0 in | Wt 205.5 lb

## 2022-06-09 DIAGNOSIS — R2689 Other abnormalities of gait and mobility: Secondary | ICD-10-CM | POA: Diagnosis not present

## 2022-06-09 DIAGNOSIS — G629 Polyneuropathy, unspecified: Secondary | ICD-10-CM

## 2022-06-09 NOTE — Progress Notes (Signed)
[facility]  PATIENT: Jeffrey Ewing DOB: [birth year]  REFERRING CLINICIAN: Crist Infante, MD HISTORY FROM: Patient  REASON FOR VISIT: Vertigo    HISTORICAL  CHIEF COMPLAINT:  Chief Complaint  Patient presents with   Follow-up    Rm 12. Alone. No new concerns, continues to have balance issues.    INTERVAL HISTORY 06/09/2022:  Patient presents today for follow-up, last visit was a year ago.  Since then he has been doing well, denies any room spinning sensation.  He still reports problem with balance, causing him some anxiety but he did follow-up with his primary care doctor who started him on Zoloft.  Since being on Zoloft for the past month, he reports that anxiety has been well controlled. Denies any falls.  He does workout at least 5 days a week, still drive no recent active accident, he is independent in all activities of daily living.  Reports occasional sharp pain in the left toes but otherwise his neuropathy is not causing him any major pain.   INTERVAL HISTORY 06/05/2021 Patient presents today for follow-up, at last visit plan was to start low-dose Valium to help with the episode of vertigo.  He reported discontinued Valium after 1 month due to potential side effects.  Currently he is not taking any medication for his symptoms.  Patient denies any room spinning sensation but states that his symptoms feel like its unsteadiness that is constant 24/7.  He denies any history of neuropathy, denies any history of foot pain.   HISTORY OF PRESENT ILLNESS:  This is a 82 year old gentleman with past medical history of vertigo, hyperlipidemia, TIA who is presenting for recurrent vertigo.  Patient states the vertigo started in July 2020.  At that time, he described as spinning sensation without any nausea or vomiting.  He had schedule an appointment with ENT but unfortunately had shoulder injury requiring surgical repair.  After having his shoulder repaired, he reported  that his dizziness improve and he was doing well until late summer in 2021 when the dizziness recur.  At that time he described the symptoms as being unsteady like he was on the boat, denies any spinning sensation.  Since summer 2021 he has seen 2 ENT, has seen audiologist, had MRI brain and MRA of head which was all normal, no evidence of intracranial pathology that can explain his recurrent dizziness.  He did follow with audiology and was told that he has hearing loss, he has hearing aids currently.  He also reported intermittent tinnitus, gets fatigue easily.  He mentioned that he was prescribed meclizine but in fact, he has never used it for his dizziness. Currently when he had acute episode of dizziness he will lay down and wait for the symptoms to pass.  He reported the dizziness has caused "extreme anxiety, it is bothersome and limit what he can do.  In the month of July he had 2 episode 1 episode he was picking heavy things while on a boat and the second episode that happened yesterday he picked up a child up to pick a leaf from the tree then had acute dizziness.  He reported that his dizziness can last up to 3 to 4 hours, again no falls associated with his dizziness. He has completed vestibular rehab in the past which provided some relief but dizziness still persistent.      OTHER MEDICAL CONDITIONS: TIA, Melanoma, HLD, history of kidney stone.    REVIEW OF SYSTEMS: Full 14 system review of systems  performed and negative with exception of: as noted in the HPI  ALLERGIES: No Known Allergies  HOME MEDICATIONS: Outpatient Medications Prior to Visit  Medication Sig Dispense Refill   CRESTOR 20 MG tablet Take 20 mg by mouth daily.      dipyridamole-aspirin (AGGRENOX) 200-25 MG per 12 hr capsule Take 1 capsule by mouth 2 (two) times daily.     ezetimibe (ZETIA) 10 MG tablet TAKE 1 TABLET(10 MG) BY MOUTH DAILY 90 tablet 1   sertraline (ZOLOFT) 50 MG tablet Take 50 mg by mouth daily.     UNABLE  TO FIND Take 1 tablet by mouth daily. Med Name: metanx for neuropathy of feet     No facility-administered medications prior to visit.    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 2013   JOINT REPLACEMENT Bilateral    KNEE ARTHROSCOPY  2009   left   MASS EXCISION Right 04/03/2016   Procedure: EXCISION cyst right index finger abd debridement proximal interphalangeal;  Surgeon: Daryll Brod, MD;  Location: [facility];  Service: Orthopedics;  Laterality: Right;  FAB   PROSTATECTOMY  2008   removal melanoma  2001   left cheek   REVERSE SHOULDER ARTHROPLASTY Right 03/04/2019   Procedure: REVERSE SHOULDER ARTHROPLASTY;  Surgeon: Netta Cedars, MD;  Location: [facility];  Service: Orthopedics;  Laterality: Right;   TONSILLECTOMY  as child   TOTAL KNEE ARTHROPLASTY Right 12/20/2012   Procedure: RIGHT TOTAL KNEE ARTHROPLASTY;  Surgeon: Gearlean Alf, MD;  Location: [facility];  Service: Orthopedics;  Laterality: Right;   TOTAL KNEE ARTHROPLASTY Bilateral    VASECTOMY  1978    FAMILY HISTORY: Family History  Problem Relation Age of Onset   Alzheimer's disease Mother    Heart attack Father    Hypertension Father    CAD Father    Colon cancer Neg Hx    Stomach cancer Neg Hx    Esophageal cancer Neg Hx    Rectal cancer Neg Hx     SOCIAL HISTORY: Social History   Socioeconomic History   Marital status: Married    Spouse name: Not on file   Number of children: 3    Years of education: Not on file   Highest education level: Not on file  Occupational History   Not on file  Tobacco Use   Smoking status: Former    Packs/day: 1.00    Years: 41.00    Total pack years: 41.00    Types: Cigarettes    Quit date: 11/05/2003    Years since quitting: 18.6   Smokeless tobacco: Never  Vaping Use   Vaping Use: Never used  Substance and Sexual Activity   Alcohol use: No   Drug use: No   Sexual activity: Not on file  Other Topics Concern   Not on file  Social History Narrative  Not on file   Social Determinants of Health   Financial Resource Strain: Not on file  Food Insecurity: Not on file  Transportation Needs: Not on file  Physical Activity: Not on file  Stress: Not on file  Social Connections: Not on file  Intimate Partner Violence: Not on file     PHYSICAL EXAM  GENERAL EXAM/CONSTITUTIONAL: Vitals:  Vitals:   06/09/22 1019  BP: 135/70  Pulse: 71  Weight: 205 lb 8 oz (93.2 kg)  Height: '5\' 10"'$  (1.778 m)    Body mass index is 29.49 kg/m. Wt Readings from Last 3 Encounters:  06/09/22 205 lb 8 oz (93.2 kg)  06/05/21 195 lb (88.5 kg)  04/16/21 195 lb 1.7 oz (88.5 kg)   Patient is in no distress; well developed, nourished and groomed; neck is supple  EYES: Visual fields full to confrontation, Extraocular movements intacts,   MUSCULOSKELETAL: Gait, strength, tone, movements noted in Neurologic exam below  NEUROLOGIC: MENTAL STATUS: awake, alert, oriented to person, place and time recent and remote memory intact normal attention and concentration language fluent, comprehension intact, naming intact fund of knowledge appropriate  CRANIAL NERVE:  2nd, 3rd, 4th, 6th - visual fields full to confrontation, extraocular muscles intact, no nystagmus 5th - facial sensation symmetric 7th - facial strength symmetric 8th - hearing intact 9th - palate elevates symmetrically, uvula midline 11th - shoulder shrug symmetric 12th - tongue  protrusion midline  MOTOR:  normal bulk and tone, full strength in the BUE, BLE  SENSORY:  Decrease vibration and pinprick in the bilateral feet.   COORDINATION:  finger-nose-finger, fine finger movements normal  REFLEXES:  deep tendon reflexes present and symmetric  GAIT/STATION:  Wide base, positive Romberg   DIAGNOSTIC DATA (LABS, IMAGING, TESTING) - I reviewed patient records, labs, notes, testing and imaging myself where available.  Lab Results  Component Value Date   WBC 9.6 04/16/2021   HGB 14.0 04/16/2021   HCT 40.3 04/16/2021   MCV 104.9 (H) 04/16/2021   PLT 115 (L) 04/16/2021      Component Value Date/Time   NA 135 04/16/2021 1501   K 4.2 04/16/2021 1501   CL 100 04/16/2021 1501   CO2 24 04/16/2021 1501   GLUCOSE 90 04/16/2021 1501   BUN 17 04/16/2021 1501   CREATININE 1.08 04/16/2021 1501   CALCIUM 9.1 04/16/2021 1501   PROT 6.5 04/19/2020 1038   ALBUMIN 4.4 04/19/2020 1038   AST 27 04/19/2020 1038   ALT 20 04/19/2020 1038   ALKPHOS 75 04/19/2020 1038   BILITOT 1.0 04/19/2020 1038   GFRNONAA >60 04/16/2021 1501   GFRAA >60 03/05/2019 0221   No results found for: "CHOL", "HDL", "LDLCALC", "LDLDIRECT", "TRIG", "CHOLHDL" No results found for: "HGBA1C" Lab Results  Component Value Date   [id number] 06/05/2021   Lab Results  Component Value Date   TSH 2.030 06/05/2021     MRI/MRA 05/15/2020:  1. No evidence of acute intracranial abnormality. 2. A few scattered foci of T2 hyperintensity are seen within the white matter of the cerebral hemispheres, nonspecific. This may represent mild chronic small vessel ischemic disease. 3. Normal MRA of the head.   ASSESSMENT AND PLAN  82 y.o. year old male with past medical history of hyperlipidemia, TIA who is presenting for follow-up for balance problem likely secondary to peripheral neuropathy.    1. Neuropathy   2. Balance problem     Patient Instructions  Continue current medications   Continue exercises  Follow up as  needed    No orders of the defined types were placed in this encounter.    No orders of the defined types were placed in this encounter.   Return if symptoms worsen or fail to improve.  I have spent a total of 30 minutes dedicated to this patient today, preparing to see patient, performing a medically appropriate examination and evaluation, ordering tests and/or medications and procedures, and counseling and educating the patient/family/caregiver; independently interpreting result and communicating results to the family/patient/caregiver; and documenting clinical information in the electronic medical record.   Alric Ran, MD 06/09/2022, 12:37 PM  [facility] [address] [place], [place] [address] [phone]

## 2022-06-09 NOTE — Patient Instructions (Signed)
Continue current medications  Continue exercises  Follow up as needed

## 2022-12-03 ENCOUNTER — Other Ambulatory Visit (HOSPITAL_COMMUNITY): Payer: Self-pay | Admitting: Internal Medicine

## 2022-12-03 DIAGNOSIS — R011 Cardiac murmur, unspecified: Secondary | ICD-10-CM

## 2022-12-16 ENCOUNTER — Ambulatory Visit (HOSPITAL_COMMUNITY): Payer: Medicare Other | Attending: Cardiology

## 2022-12-16 DIAGNOSIS — R011 Cardiac murmur, unspecified: Secondary | ICD-10-CM | POA: Diagnosis present

## 2022-12-16 LAB — ECHOCARDIOGRAM COMPLETE
AR max vel: 1.64 cm2
AV Area VTI: 1.61 cm2
AV Area mean vel: 1.51 cm2
AV Mean grad: 17.4 mmHg
AV Peak grad: 31.5 mmHg
Ao pk vel: 2.81 m/s
Area-P 1/2: 3.02 cm2
P 1/2 time: 339 ms
S' Lateral: 1.9 cm

## 2023-01-05 NOTE — Progress Notes (Unsigned)
Cardiology Office Note:   Date:  01/06/2023  ID:  Jeffrey Ewing, DOB 01-14-1941, MRN [id number] PCP:  Rodrigo Ran, MD  [facility] Providers Cardiologist:  Alverda Skeans, MD Referring MD: Rodrigo Ran, MD  Chief Complaint/Reason for Referral: Aortic stenosis ASSESSMENT:    1. Aortic stenosis, moderate   2. Aortic atherosclerosis (HCC)   3. Hyperlipidemia LDL goal <70   4. Renal artery stenosis (HCC)   5. 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  Will continue to monitor for symptoms and progression of valvular disease.  Echocardiogram in 6 months with follow-up at that time as well. Aortic atherosclerosis: Continue Aggrenox, Zetia, and Crestor. Hyperlipidemia: Check lipid panel, LFTs today.  Goal LDL is less than 55 if possible given history of stroke but less than 70 is acceptable. 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  I discussed my recommendation of stopping his Aggrenox and starting aspirin 81 mg daily due to increased risk of bleeding.  The patient's TIA was almost 20 years ago.  He will think about this.            Dispo:  Return in about 6 months (around 07/07/2023).      Medication Adjustments/Labs and Tests Ordered: Current medicines are reviewed at length with the patient today.  Concerns regarding medicines are outlined above.  The following changes have been made:  no change   Labs/tests ordered: Orders Placed This Encounter  Procedures   Lipid panel   Hepatic function panel   EKG 12-Lead   ECHOCARDIOGRAM COMPLETE    Medication Changes: No orders of the defined types were placed in this encounter.   Current medicines are reviewed at length with the patient today.  The patient does not have concerns regarding medicines.  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  The patient is doing well.  He is quite active denies any shortness of breath, chest discomfort, presyncope or syncope.  He is very concerned about the development of aortic valvular disease and is somewhat anxious about it.  He has had no issues with his other medications.  Interestingly he still remains on Aggrenox despite a TIA almost 20 years ago.  He has had no severe bleeding or bruising with this medication.  He is tolerating his rosuvastatin without myalgias or arthralgias.  He fortunately has not required any emergency room visits or hospitalizations.  He does not smoke currently.  He does not drink.  He is otherwise well without significant complaints today.          Current Medications: Current Meds  Medication Sig   amoxicillin (AMOXIL) 500 MG tablet Take 2,000 mg by mouth once.   CRESTOR 20 MG tablet Take 20 mg by mouth daily.    dipyridamole-aspirin (AGGRENOX) 200-25 MG per 12 hr capsule Take 1 capsule by mouth 2 (two) times daily.   DULoxetine (CYMBALTA) 60 MG  capsule Take 60 mg by mouth daily.   ezetimibe (ZETIA) 10 MG tablet TAKE 1 TABLET(10 MG) BY MOUTH DAILY   meclizine (ANTIVERT) 25 MG tablet Take 1 tablet by mouth 3 (three) times daily as needed.   UNABLE TO FIND Take 1 tablet by mouth daily. Med Name: metanx for neuropathy of feet     Review of Systems:   Please see the history of present illness.    All other systems reviewed and are negative.     EKGs/Labs/Other Test  Reviewed:   EKG: EKG performed January 2023 demonstrates sinus rhythm  EKG Interpretation Date/Time:  Tuesday January 06 2023 10:31:23 EDT Ventricular Rate:  97 PR Interval:  140 QRS Duration:  78 QT Interval:  350 QTC Calculation: 444 R Axis:   41  Text Interpretation: Normal sinus rhythm Normal ECG When compared with ECG of 16-Apr-2021 14:53, No significant change was found Confirmed by Alverda Skeans ([id number]) on 01/06/2023 10:38:10 AM         Risk Assessment/Calculations:          Physical Exam:   VS:  BP 130/75   Pulse 96   Ht 5\' 10"  (1.778 m)   Wt 205 lb 3.2 oz (93.1 kg)   SpO2 93%   BMI 29.44 kg/m        Wt Readings from Last 3 Encounters:  01/06/23 205 lb 3.2 oz (93.1 kg)  06/09/22 205 lb 8 oz (93.2 kg)  06/05/21 195 lb (88.5 kg)      GENERAL:  No apparent distress, AOx3 HEENT:  No carotid bruits, +2 carotid impulses, no scleral icterus CAR: RRR with very slight systolic murmur without gallops, rubs, or thrills RES:  Clear to auscultation bilaterally ABD:  Soft, nontender, nondistended, positive bowel sounds x 4 VASC:  +2 radial pulses, +2 carotid pulses NEURO:  CN 2-12 grossly intact; motor and sensory grossly intact PSYCH:  No active depression or anxiety EXT:  No edema, ecchymosis, or cyanosis  Signed, Orbie Pyo, MD  01/06/2023 10:54 AM    [facility] [address] [place], [place], [place]  [address] Phone: [phone]; Fax: [phone]   Note:  This document was prepared using Dragon voice recognition software and may include unintentional dictation errors.

## 2023-01-06 ENCOUNTER — Encounter: Payer: Self-pay | Admitting: Internal Medicine

## 2023-01-06 ENCOUNTER — Ambulatory Visit: Payer: Medicare Other | Attending: Internal Medicine | Admitting: Internal Medicine

## 2023-01-06 VITALS — BP 130/75 | HR 96 | Ht 70.0 in | Wt 205.2 lb

## 2023-01-06 DIAGNOSIS — I350 Nonrheumatic aortic (valve) stenosis: Secondary | ICD-10-CM

## 2023-01-06 DIAGNOSIS — I701 Atherosclerosis of renal artery: Secondary | ICD-10-CM

## 2023-01-06 DIAGNOSIS — I700 Atherosclerosis of aorta: Secondary | ICD-10-CM | POA: Diagnosis not present

## 2023-01-06 DIAGNOSIS — E785 Hyperlipidemia, unspecified: Secondary | ICD-10-CM

## 2023-01-06 DIAGNOSIS — I639 Cerebral infarction, unspecified: Secondary | ICD-10-CM

## 2023-01-06 NOTE — Patient Instructions (Signed)
Medication Instructions:  Your physician recommends that you continue on your current medications as directed. Please refer to the Current Medication list given to you today.  *If you need a refill on your cardiac medications before your next appointment, please call your pharmacy*   Lab Work: FLP, LFT  If you have labs (blood work) drawn today and your tests are completely normal, you will receive your results only by: MyChart Message (if you have MyChart) OR A paper copy in the mail If you have any lab test that is abnormal or we need to change your treatment, we will call you to review the results.   Testing/Procedures: APRIL 2025: Your physician has requested that you have an echocardiogram. Echocardiography is a painless test that uses sound waves to create images of your heart. It provides your doctor with information about the size and shape of your heart and how well your heart's chambers and valves are working. This procedure takes approximately one hour. There are no restrictions for this procedure. Please do NOT wear cologne, perfume, aftershave, or lotions (deodorant is allowed). Please arrive 15 minutes prior to your appointment time.    Follow-Up: At [facility], you and your health needs are our priority.  As part of our continuing mission to provide you with exceptional heart care, we have created designated Provider Care Teams.  These Care Teams include your primary Cardiologist (physician) and Advanced Practice Providers (APPs -  Physician Assistants and Nurse Practitioners) who all work together to provide you with the care you need, when you need it.   Your next appointment:   6 month(s)  Provider:   Orbie Pyo, MD

## 2023-01-07 LAB — LIPID PANEL
Chol/HDL Ratio: 2.1 {ratio} (ref 0.0–5.0)
Cholesterol, Total: 130 mg/dL (ref 100–199)
HDL: 63 mg/dL (ref >39)
LDL Chol Calc (NIH): 50 mg/dL (ref 0–99)
Triglycerides: 91 mg/dL (ref 0–149)
VLDL Cholesterol Cal: 17 mg/dL (ref 5–40)

## 2023-01-07 LAB — HEPATIC FUNCTION PANEL
ALT: 20 [IU]/L (ref 0–44)
AST: 23 [IU]/L (ref 0–40)
Albumin: 4.7 g/dL (ref 3.7–4.7)
Alkaline Phosphatase: 78 [IU]/L (ref 44–121)
Bilirubin Total: 0.9 mg/dL (ref 0.0–1.2)
Bilirubin, Direct: 0.26 mg/dL (ref 0.00–0.40)
Total Protein: 6.5 g/dL (ref 6.0–8.5)

## 2023-07-02 ENCOUNTER — Encounter: Payer: Self-pay | Admitting: Internal Medicine

## 2023-07-02 ENCOUNTER — Ambulatory Visit (HOSPITAL_COMMUNITY): Payer: Medicare Other | Attending: Cardiology

## 2023-07-02 DIAGNOSIS — I700 Atherosclerosis of aorta: Secondary | ICD-10-CM

## 2023-07-02 DIAGNOSIS — I350 Nonrheumatic aortic (valve) stenosis: Secondary | ICD-10-CM | POA: Diagnosis present

## 2023-07-02 LAB — ECHOCARDIOGRAM COMPLETE
AR max vel: 1.09 cm2
AV Area VTI: 1.03 cm2
AV Area mean vel: 0.99 cm2
AV Mean grad: 17.2 mmHg
AV Peak grad: 27.3 mmHg
Ao pk vel: 2.61 m/s
Area-P 1/2: 2.97 cm2
S' Lateral: 2.4 cm

## 2023-07-03 NOTE — Progress Notes (Addendum)
 Cardiology Office Note:   Date:  07/08/2023  ID:  Jeffrey Ewing, DOB [birth year], MRN [id number] PCP:  Jeffrey Ran, MD  [facility] Providers Cardiologist:  Jeffrey Skeans, MD Referring MD: Jeffrey Ran, MD  Chief Complaint/Reason for Referral: Aortic stenosis ASSESSMENT:    1. Nonrheumatic aortic valve stenosis   2. Hyperlipidemia LDL goal <55   3. Aortic atherosclerosis (HCC)   4. Transient ischemic attack   5. Renal artery stenosis (HCC)      PLAN:   In order of problems listed above: Aortic stenosis: The patient endorses NYHA II symptoms of fatigue and CCS 1 angina.  I will have his echocardiogram screen for the moderate aortic stenosis continuing access program registry.  If he is not a candidate I will see him back in 6 months with a repeat echocardiogram.  If he is a candidate I will have him return to discuss further.  (Echo screened by Jeffrey Ewing >> does not make critieria for CAP). Hyperlipidemia: Continue Crestor 20 mg daily and Zetia 10 mg daily; LDL on last check was at goal at 50.   Aortic atherosclerosis: Continue Aggrenox 200 x 25 mg, Crestor 20 mg, Zetia 10 mg, and blood pressure control.   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  Change Aggrenox to aspirin 81 mg.   Renal artery stenosis: Continue Crestor 20 mg, aspirin 81 mg, monitor blood pressure.             Dispo:  Return in about 6 months (around 01/07/2024).      Medication Adjustments/Labs and Tests Ordered: Current medicines are reviewed at length with the patient today.  Concerns regarding medicines are outlined above.  The following changes have been made:  no change   Labs/tests ordered: Orders Placed This Encounter  Procedures   ECHOCARDIOGRAM COMPLETE    Medication Changes: Meds ordered this encounter  Medications   aspirin EC 81 MG tablet    Sig: Take 1 tablet (81 mg total) by mouth daily. Swallow whole.    Current medicines are reviewed at length  with the patient today.  The patient does not have concerns regarding medicines.  History of Present Illness:      FOCUSED PROBLEM LIST:   TIA 2005 Cardiac MRI negative for intracardiac shunt On indefinite Aggrenox Hyperlipidemia Aortic atherosclerosis  CTA abdomen 2022 Aortic stenosis AVA 1.6, MG 17, V-max 2.8, EF 65 to 70% TTE September 2024  Did not meet criteria for PROGRESS CAPP AVA 1.03, MG 17, V-max 2.6, EF 60 to 65% TTE April 2025 Peripheral vascular disease  Moderate bilateral renal artery disease on ultrasound Peripheral neuropathy  Followed by neurology  October 2024: The patient is here for recommendations regarding moderate aortic stenosis on echocardiogram performed due to an incidental murmur.  The patient is doing well.  He is quite active denies any shortness of breath, chest discomfort, presyncope or syncope.  He is very concerned about the development of aortic valvular disease and is somewhat anxious about it.  He has had no issues with his other medications.  Interestingly he still remains on Aggrenox despite a TIA almost 20 years ago.  He has had no severe bleeding or bruising with this medication.  He is tolerating his rosuvastatin without myalgias or arthralgias.  He fortunately has not required any emergency room visits or hospitalizations.  He does not smoke currently.  He does not drink.  He is otherwise well without significant complaints today.  Plan: Consider changing from Aggrenox to  aspirin.  Obtain echocardiogram to evaluate AS.  April 2025:  Patient consents to use of AI scribe. The patient returns for routine follow-up.  In the interim his echocardiogram demonstrated progression of his aortic stenosis.  His LDL was at goal at 50.  He experiences fatigue, particularly after physical activities such as gardening and walking. He feels more tired than usual after prolonged activities, like standing for four hours at an exhibit, but resting improves his energy  levels the following day. He describes occasional shortness of breath, which is slightly more pronounced than a year ago, but not severe. No blackouts, lightheadedness, or chest tightness during physical activities.  He feels down, attributing this to concerns about his heart condition and his involvement in church activities, which sometimes involve visiting sick individuals. He has been prescribed duloxetine to help manage these feelings.  He has a history of bilateral knee replacements, chronic disequilibrium, and neuropathy in his feet. Despite these conditions, he remains active, frequently engaging in physical activities and social commitments, including church activities and caring for his grandchildren.          Current Medications: Current Meds  Medication Sig   amoxicillin (AMOXIL) 500 MG tablet Take 2,000 mg by mouth once.   aspirin EC 81 MG tablet Take 1 tablet (81 mg total) by mouth daily. Swallow whole.   CRESTOR 20 MG tablet Take 20 mg by mouth daily.    diazepam (VALIUM) 2 MG tablet Take by mouth.   DULoxetine (CYMBALTA) 60 MG capsule Take 60 mg by mouth daily.   ezetimibe (ZETIA) 10 MG tablet TAKE 1 TABLET(10 MG) BY MOUTH DAILY   gabapentin (NEURONTIN) 100 MG capsule Take by mouth.   UNABLE TO FIND Take 1 tablet by mouth daily. Med Name: metanx for neuropathy of feet   [DISCONTINUED] dipyridamole-aspirin (AGGRENOX) 200-25 MG per 12 hr capsule Take 1 capsule by mouth 2 (two) times daily.     Review of Systems:   Please see the history of present illness.    All other systems reviewed and are negative.     EKGs/Labs/Other Test Reviewed:   EKG: EKG performed J October 2024 demonstrates normal sinus rhythm without bundle-branch blocks  EKG Interpretation Date/Time:    Ventricular Rate:    PR Interval:    QRS Duration:    QT Interval:    QTC Calculation:   R Axis:      Text Interpretation:           Risk Assessment/Calculations:          Physical Exam:    VS:  BP 130/80   Pulse 84   Ht 5\' 10"  (1.778 m)   Wt 207 lb (93.9 kg)   SpO2 97%   BMI 29.70 kg/m        Wt Readings from Last 3 Encounters:  07/08/23 207 lb (93.9 kg)  01/06/23 205 lb 3.2 oz (93.1 kg)  06/09/22 205 lb 8 oz (93.2 kg)      GENERAL:  No apparent distress, AOx3 HEENT:  No carotid bruits, +2 carotid impulses, no scleral icterus CAR: RRR with 3 out of 6 systolic ejection murmur without gallops, rubs, or thrills RES:  Clear to auscultation bilaterally ABD:  Soft, nontender, nondistended, positive bowel sounds x 4 VASC:  +2 radial pulses, +2 carotid pulses NEURO:  CN 2-12 grossly intact; motor and sensory grossly intact PSYCH:  No active depression or anxiety EXT:  No edema, ecchymosis, or cyanosis  Signed, Orbie Pyo, MD  07/08/2023 11:12 AM    [facility] [address] [place], [place], [place]  [address] Phone: [phone]; Fax: [phone]   Note:  This document was prepared using Dragon voice recognition software and may include unintentional dictation errors.

## 2023-07-08 ENCOUNTER — Encounter: Payer: Self-pay | Admitting: Internal Medicine

## 2023-07-08 ENCOUNTER — Ambulatory Visit: Payer: Medicare Other | Attending: Internal Medicine | Admitting: Internal Medicine

## 2023-07-08 VITALS — BP 130/80 | HR 84 | Ht 70.0 in | Wt 207.0 lb

## 2023-07-08 DIAGNOSIS — I701 Atherosclerosis of renal artery: Secondary | ICD-10-CM

## 2023-07-08 DIAGNOSIS — I350 Nonrheumatic aortic (valve) stenosis: Secondary | ICD-10-CM | POA: Diagnosis not present

## 2023-07-08 DIAGNOSIS — I700 Atherosclerosis of aorta: Secondary | ICD-10-CM | POA: Diagnosis not present

## 2023-07-08 DIAGNOSIS — E785 Hyperlipidemia, unspecified: Secondary | ICD-10-CM

## 2023-07-08 DIAGNOSIS — G459 Transient cerebral ischemic attack, unspecified: Secondary | ICD-10-CM

## 2023-07-08 MED ORDER — ASPIRIN 81 MG PO TBEC: 81.0000 mg | DELAYED_RELEASE_TABLET | Freq: Every day | ORAL | Status: AC

## 2023-07-08 NOTE — Patient Instructions (Addendum)
 Medication Instructions:  Your physician has recommended you make the following change in your medication:   STOP Dipyridamole-aspirin (Aggrenox)  START Aspirin 81 mg once daily    *If you need a refill on your cardiac medications before your next appointment, please call your pharmacy*  Lab Work: None ordered today. If you have labs (blood work) drawn today and your tests are completely normal, you will receive your results only by: MyChart Message (if you have MyChart) OR A paper copy in the mail If you have any lab test that is abnormal or we need to change your treatment, we will call you to review the results.  Testing/Procedures: Your physician has requested that you have an echocardiogram in October 2025. Echocardiography is a painless test that uses sound waves to create images of your heart. It provides your doctor with information about the size and shape of your heart and how well your heart's chambers and valves are working. This procedure takes approximately one hour. There are no restrictions for this procedure. Please do NOT wear cologne, perfume, aftershave, or lotions (deodorant is allowed). Please arrive 15 minutes prior to your appointment time.  Please note: We ask at that you not bring children with you during ultrasound (echo/ vascular) testing. Due to room size and safety concerns, children are not allowed in the ultrasound rooms during exams. Our front office staff cannot provide observation of children in our lobby area while testing is being conducted. An adult accompanying a patient to their appointment will only be allowed in the ultrasound room at the discretion of the ultrasound technician under special circumstances. We apologize for any inconvenience.   Follow-Up: At [facility], you and your health needs are our priority.  As part of our continuing mission to provide you with exceptional heart care, we have created designated Provider Care Teams.  These  Care Teams include your primary Cardiologist (physician) and Advanced Practice Providers (APPs -  Physician Assistants and Nurse Practitioners) who all work together to provide you with the care you need, when you need it.  Your next appointment:   6 months  The format for your next appointment:   In Person  Provider:   Dr. Lynnette Caffey  Other Instructions   1st Floor: - Lobby - Registration  - Pharmacy  - Lab - Cafe  2nd Floor: - PV Lab - Diagnostic Testing (echo, CT, nuclear med)  3rd Floor: - Vacant  4th Floor: - TCTS (cardiothoracic surgery) - AFib Clinic - Structural Heart Clinic - Vascular Surgery  - Vascular Ultrasound  5th Floor: - HeartCare Cardiology (general and EP) - Clinical Pharmacy for coumadin, hypertension, lipid, weight-loss medications, and med management appointments    Valet parking services will be available as well.

## 2023-08-10 ENCOUNTER — Telehealth: Payer: Self-pay | Admitting: Internal Medicine

## 2023-08-10 NOTE — Telephone Encounter (Signed)
 Spoke with pt and explained that Dr. Lorie Rook advised for pt to see DOD this week. Arranged appt with Dr. Maximo Spar for 5/16.

## 2023-08-10 NOTE — Telephone Encounter (Signed)
 Patient c/o Palpitations: STAT if patient c/o lightheadedness, shortness of breath, or chest pain  How long have you had palpitations/irregular HR/ Afib? Are you having the symptoms now? Past few weeks  Are you currently experiencing lightheadedness, SOB or CP? No  Do you have a history of afib (atrial fibrillation) or irregular heart rhythm? No  Have you checked your BP or HR? (document readings if available): 84 HR 5 mins ago  Are you experiencing any other symptoms? Sob since April  Patient stated when he last saw Dr. Paulita Boss he felt good and was not having any symptoms. Since then, the patient has been having palpitations and noticed his heart is skipping a beat. Patient is concerned. Please advise.

## 2023-08-10 NOTE — Telephone Encounter (Signed)
 Spoke with pt over the phone. Pt states he has had a couple of episodes of where he had a slight pain in the left side of his chest and has experienced palpitations. Pt states he has also been experiencing extreme fatigue. Pt's wife is a Engineer, civil (consulting) and stated that when she felt his radial pulse for his HR it was irregular. Pt wanted to call in and let us  know what symptoms he was having for Dr. Lorie Rook to review.

## 2023-08-14 ENCOUNTER — Ambulatory Visit

## 2023-08-14 ENCOUNTER — Encounter: Payer: Self-pay | Admitting: Internal Medicine

## 2023-08-14 ENCOUNTER — Ambulatory Visit: Attending: Internal Medicine | Admitting: Internal Medicine

## 2023-08-14 VITALS — BP 144/72 | HR 85 | Ht 70.0 in

## 2023-08-14 DIAGNOSIS — I350 Nonrheumatic aortic (valve) stenosis: Secondary | ICD-10-CM

## 2023-08-14 DIAGNOSIS — R002 Palpitations: Secondary | ICD-10-CM | POA: Diagnosis not present

## 2023-08-14 NOTE — Progress Notes (Signed)
 OFFICE NOTE  Chief Complaint:  Palpitations  Primary Care Physician: Aldo Hun, MD  HPI:  Jeffrey Ewing is a 83 y.o. male with a past medial history significant for aortic stenosis, dyslipidemia, prior stroke in 2005 and family history of heart disease.  [name] has recently seen Dr. Lorie Rook and called into the office with complaints of palpitations.  His wife who is a nurse noted that his heart rate was irregular.  Then he was advised to come in and be seen today with me as the doctor of the day.  [name] had 2 episodes where he felt palpitations and was noted to have an irregular heart rhythm.  EKG today however shows a normal sinus rhythm.  He does have an Apple Watch but says it has not been set up to detect atrial fibrillation or arrhythmias but that he was going to try to learn it better.  He is concerned about his progressive aortic stenosis.  He says his valve area has dropped precipitously and he wonders whether or not it is appropriate to wait until October to have another echo.  He wants to be "proactive" about trying to replace his valve however I explained to him that we would not necessarily replace the valve until he was symptomatic clearly related to the valve and had severe aortic stenosis.  His most recent echo suggested a valve area around 1 with a mean gradient of 17.2 mmHg however the DVI was 0.27 and it was suggested that the patient might have paradoxical low-flow low gradient stenosis.  He also reports some left-sided chest discomfort but that is rare and not associated with exertion or relieved by rest.  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 2013   JOINT REPLACEMENT Bilateral    KNEE ARTHROSCOPY  2009   left   MASS EXCISION Right 04/03/2016   Procedure: EXCISION cyst right index finger abd debridement proximal interphalangeal;  Surgeon: Lyanne Sample, MD;  Location: [facility];  Service: Orthopedics;  Laterality: Right;  FAB   PROSTATECTOMY  2008   removal melanoma  2001   left cheek   REVERSE SHOULDER ARTHROPLASTY Right 03/04/2019   Procedure: REVERSE SHOULDER ARTHROPLASTY;  Surgeon: Winston Hawking, MD;  Location: [facility];  Service: Orthopedics;  Laterality: Right;   TONSILLECTOMY  as child   TOTAL KNEE ARTHROPLASTY Right 12/20/2012   Procedure: RIGHT TOTAL KNEE ARTHROPLASTY;  Surgeon: Aurther Blue, MD;  Location: [facility];  Service: Orthopedics;  Laterality: Right;   TOTAL KNEE ARTHROPLASTY Bilateral    VASECTOMY  1978    FAMHx:  Family History  Problem Relation Age of Onset   Alzheimer's disease Mother    Heart attack Father    Hypertension Father    CAD Father    Colon cancer Neg Hx    Stomach cancer Neg Hx    Esophageal cancer  Neg Hx    Rectal cancer Neg Hx     SOCHx:   reports that he quit smoking about 19 years ago. His smoking use included cigarettes. He started smoking about 60 years ago. He has a 41 pack-year smoking history. He has never used smokeless tobacco. He reports that he does not drink alcohol and does not use drugs.  ALLERGIES:  Allergies  Allergen Reactions   Meloxicam Nausea Only   Sertraline Nausea Only    ROS: Pertinent items noted in HPI and remainder of comprehensive ROS otherwise negative.  HOME MEDS: Current Outpatient Medications on File Prior to Visit  Medication Sig Dispense Refill   amoxicillin (AMOXIL) 500 MG tablet Take 2,000 mg by mouth once.     aspirin  EC 81 MG tablet Take 1 tablet (81 mg total) by  mouth daily. Swallow whole.     CRESTOR  20 MG tablet Take 20 mg by mouth daily.      DULoxetine (CYMBALTA) 60 MG capsule Take 60 mg by mouth daily.     ezetimibe  (ZETIA ) 10 MG tablet TAKE 1 TABLET(10 MG) BY MOUTH DAILY 90 tablet 1   No current facility-administered medications on file prior to visit.    LABS/IMAGING: No results found for this or any previous visit (from the past 48 hours). No results found.  LIPID PANEL:    Component Value Date/Time   CHOL 130 01/06/2023 1108   TRIG 91 01/06/2023 1108   HDL 63 01/06/2023 1108   CHOLHDL 2.1 01/06/2023 1108   LDLCALC 50 01/06/2023 1108     WEIGHTS: Wt Readings from Last 3 Encounters:  07/08/23 207 lb (93.9 kg)  01/06/23 205 lb 3.2 oz (93.1 kg)  06/09/22 205 lb 8 oz (93.2 kg)    VITALS: BP (!) 144/72 (BP Location: Left Arm, Patient Position: Sitting, Cuff Size: Normal)   Pulse 85   Ht 5\' 10"  (1.778 m)   SpO2 94%   BMI 29.70 kg/m   EXAM: General appearance: alert, no distress, and anxious Lungs: clear to auscultation bilaterally Heart: regular rate and rhythm, S1, S2 normal, and systolic murmur: late systolic 3/6, crescendo at 2nd right intercostal space Extremities: extremities normal, atraumatic, no cyanosis or edema Neurologic: Grossly normal  EKG: EKG Interpretation Date/Time:  Friday Aug 14 2023 13:15:38 EDT Ventricular Rate:  88 PR Interval:  154 QRS Duration:  90 QT Interval:  362 QTC Calculation: 438 R Axis:   23  Text Interpretation: Normal sinus rhythm Normal ECG When compared with ECG of 06-Jan-2023 10:31, No significant change was found Confirmed by Dinah Franco [phone]) on 08/14/2023 1:24:39 PM    ASSESSMENT: Palpitations Moderate to severe aortic stenosis (DI 0.27, AVA 1.03 cm)-06/2023  PLAN: 1.   [name] is having some palpitations although EKG today shows normal sinus rhythm.  Although he has an Scientist, physiological he is not aware of how to use its functions or has set up the features on it.  I  would like to place a 2-week ZIO monitor.  He can follow-up with Dr. Lorie Rook regarding this result.  On exam he does have a late peaking systolic murmur more consistent with moderate to severe AS.  He has a follow-up echo scheduled in October.  He is concerned that he might not make it until October.  This is unlikely and I explained to him that he would likely have symptoms if his valve were to become progressively worse which would be unusual over that rapid time.  But that he  should present to the emergency department if he did become symptomatic.  Hazle Lites, MD, [facility], FNLA, FACP  [place]  [facility]  Medical Director of the Advanced Lipid Disorders &  Cardiovascular Risk Reduction Clinic Diplomate of the American Board of Clinical Lipidology Attending Cardiologist  Direct Dial: [phone]  Fax: [phone]  Website:  [email or web address] 08/14/2023, 2:09 PM

## 2023-08-14 NOTE — Progress Notes (Unsigned)
 Applied a 14 day Zio XT monitor to patient in the office ?

## 2023-08-14 NOTE — Patient Instructions (Signed)
 Medication Instructions:  Your physician recommends that you continue on your current medications as directed. Please refer to the Current Medication list given to you today.  *If you need a refill on your cardiac medications before your next appointment, please call your pharmacy*  Testing/Procedures: ZIO AT Long term monitor-Live Telemetry  Your physician has requested you wear a ZIO patch monitor for 14 days.  This is a single patch monitor. [employer] supplies one patch monitor per enrollment. Additional  stickers are not available.  Please do not apply patch if you will be having a Nuclear Stress Test, Echocardiogram, Cardiac CT, MRI,  or Chest Xray during the period you would be wearing the monitor. The patch cannot be worn during  these tests. You cannot remove and re-apply the ZIO AT patch monitor.  Your ZIO patch monitor will be mailed 3 day USPS to your address on file. It may take 3-5 days to  receive your monitor after you have been enrolled.  Once you have received your monitor, please review the enclosed instructions. Your monitor has  already been registered assigning a specific monitor serial # to you.   Billing and Patient Assistance Program information  Jeffrey Ewing has been supplied with any insurance information on record for billing. [employer] offers a sliding scale Patient Assistance Program for patients without insurance, or whose  insurance does not completely cover the cost of the ZIO patch monitor. You must apply for the  Patient Assistance Program to qualify for the discounted rate. To apply, call [employer] at [phone],  select option 4, select option 2 , ask to apply for the Patient Assistance Program, (you can request an  interpreter if needed). [employer] will ask your household income and how many people are in your  household. [employer] will quote your out-of-pocket cost based on this information. They will also be able  to set up a 12 month interest free payment plan if  needed.  Applying the monitor   Shave hair from upper left chest.  Hold the abrader disc by orange tab. Rub the abrader in 40 strokes over left upper chest as indicated in  your monitor instructions.  Clean area with 4 enclosed alcohol pads. Use all pads to ensure the area is cleaned thoroughly. Let  dry.  Apply patch as indicated in monitor instructions. Patch will be placed under collarbone on left side of  chest with arrow pointing upward.  Rub patch adhesive wings for 2 minutes. Remove the white label marked "1". Remove the white label  marked "2". Rub patch adhesive wings for 2 additional minutes.  While looking in a mirror, press and release button in center of patch. A small green light will flash 3-4  times. This will be your only indicator that the monitor has been turned on.  Do not shower for the first 24 hours. You may shower after the first 24 hours.  Press the button if you feel a symptom. You will hear a small click. Record Date, Time and Symptom in  the Patient Log.   Starting the Gateway  In your kit there is a Audiological scientist box the size of a cellphone. This is Buyer, retail. It transmits all your  recorded data to [facility]. This box must always stay within 10 feet of you. Open the box and push the *  button. There will be a light that blinks orange and then green a few times. When the light stops  blinking, the Gateway is connected to the ZIO  patch. Call [employer] at [phone] to confirm your monitor is transmitting.  Returning your monitor  Remove your patch and place it inside the Gateway. In the lower half of the Gateway there is a white  bag with prepaid postage on it. Place Gateway in bag and seal. Mail package back to [place] as soon as  possible. Your physician should have your final report approximately 7 days after you have mailed back  your monitor. Call [facility] Customer Care at [phone] if you have questions regarding your ZIO AT  patch  monitor. Call them immediately if you see an orange light blinking on your monitor.  If your monitor falls off in less than 4 days, contact our Monitor department at [phone]. If your  monitor becomes loose or falls off after 4 days call [employer] at [phone] for suggestions on  securing your monitor  Follow-Up: At [facility], you and your health needs are our priority.  As part of our continuing mission to provide you with exceptional heart care, our providers are all part of one team.  This team includes your primary Cardiologist (physician) and Advanced Practice Providers or APPs (Physician Assistants and Nurse Practitioners) who all work together to provide you with the care you need, when you need it.  Your next appointment:   Follow up with primary cardiologist Dr. Lorie Rook

## 2023-08-19 ENCOUNTER — Encounter: Payer: Self-pay | Admitting: Internal Medicine

## 2023-09-06 ENCOUNTER — Encounter: Payer: Self-pay | Admitting: Internal Medicine

## 2023-09-14 ENCOUNTER — Ambulatory Visit: Payer: Self-pay | Admitting: Internal Medicine

## 2023-09-14 DIAGNOSIS — R002 Palpitations: Secondary | ICD-10-CM | POA: Diagnosis not present

## 2023-09-21 MED ORDER — METOPROLOL SUCCINATE ER 25 MG PO TB24: 25.0000 mg | ORAL_TABLET | Freq: Every day | ORAL | 3 refills | Status: DC

## 2023-09-21 NOTE — Addendum Note (Signed)
 Addended by: RANDY HAMP SAILOR on: 09/21/2023 05:30 PM   Modules accepted: Orders

## 2023-09-22 ENCOUNTER — Other Ambulatory Visit: Payer: Self-pay | Admitting: *Deleted

## 2023-09-22 MED ORDER — METOPROLOL SUCCINATE ER 25 MG PO TB24
12.5000 mg | ORAL_TABLET | Freq: Every day | ORAL | 2 refills | Status: AC
Start: 2023-09-22 — End: ?

## 2023-09-22 NOTE — Progress Notes (Signed)
 Order for Toprol XL 12.5 mg sent to patient pharmacy and patient sent message with recommendation per Dr.Thukkani to decrease toprol XL 12.5 mg daily. Message patient to call office for any questions.

## 2024-01-13 ENCOUNTER — Ambulatory Visit (HOSPITAL_COMMUNITY)
Admission: RE | Admit: 2024-01-13 | Discharge: 2024-01-13 | Disposition: A | Discharge status: Home / Self Care | Source: Ambulatory Visit | Attending: Cardiovascular Disease | Admitting: Cardiovascular Disease

## 2024-01-13 ENCOUNTER — Ambulatory Visit: Payer: Self-pay | Admitting: Internal Medicine

## 2024-01-13 DIAGNOSIS — I350 Nonrheumatic aortic (valve) stenosis: Secondary | ICD-10-CM | POA: Insufficient documentation

## 2024-01-13 LAB — ECHOCARDIOGRAM COMPLETE
AR max vel: 0.99 cm2
AV Area VTI: 1.12 cm2
AV Area mean vel: 1.11 cm2
AV Mean grad: 25 mmHg
AV Peak grad: 39.7 mmHg
Ao pk vel: 3.15 m/s
Area-P 1/2: 3.16 cm2
P 1/2 time: 374 ms
S' Lateral: 2.6 cm

## 2024-01-16 NOTE — Progress Notes (Addendum)
 Cardiology Office Note:   Date:  01/20/2024  ID:  Jeffrey Ewing, DOB February 27, 1941, MRN [id number] PCP:  Shayne Anes, MD  [facility] Providers Cardiologist:  Wendel Haws, MD Referring MD: Shayne Anes, MD  Chief Complaint/Reason for Referral: Aortic stenosis ASSESSMENT:    1. Nonrheumatic aortic valve stenosis   2. Diastolic dysfunction   3. Hyperlipidemia LDL goal <55   4. Aortic atherosclerosis   5. Cryptogenic stroke (HCC)   6. Renal artery stenosis       PLAN:   In order of problems listed above: Aortic stenosis: The patient endorses NYHA II symptoms of fatigue and CCS 1 angina. EUROSCORE 1.19%. He qualifies for the continuing access program for progress trial.  Will have research personnel reach out to him as he is interested. Diastolic dysfunction: Continue Toprol  12.5 mg, could start ARB in the future Hyperlipidemia: Continue Crestor  20 mg daily and Zetia  10 mg daily; check lipid panel, LFTs today.  Defer LP(a) testing in this advanced age individual.  Goal LDL is less than 55 given history of stroke. Aortic atherosclerosis: Continue ASA 81, Crestor  20 mg, Zetia  10 mg, and blood pressure control.   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  Continue aspirin  81 mg.   Renal artery stenosis: Continue Crestor  20 mg, aspirin  81 mg, monitor blood pressure.             Dispo:  Return in about 6 months (around 07/20/2024).      Medication Adjustments/Labs and Tests Ordered: Current medicines are reviewed at length with the patient today.  Concerns regarding medicines are outlined above.  The following changes have been made:  no change   Labs/tests ordered: Orders Placed This Encounter  Procedures   Lipid panel   Hepatic function panel   EKG 12-Lead    Medication Changes: No orders of the defined types were placed in this encounter.   Current medicines are reviewed at length with the patient today.  The patient does not have  concerns regarding medicines.  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  The patient is doing well.  He is quite active denies any shortness of breath, chest discomfort, presyncope or syncope.  He is very concerned about the development of aortic valvular disease and is somewhat anxious about it.  He has had no issues with his other medications.  Interestingly he still remains on Aggrenox  despite a TIA almost 20 years ago.  He has had no severe bleeding or bruising with this medication.  He is tolerating his rosuvastatin  without myalgias or arthralgias.  He fortunately has not required any emergency room visits or hospitalizations.  He does not smoke currently.  He does not drink.  He is otherwise well without significant complaints today.  Plan: Consider changing from Aggrenox  to aspirin .  Obtain echocardiogram to evaluate AS.  April 2025:  Patient consents to use of AI scribe. The patient returns for routine follow-up.  In the interim his echocardiogram demonstrated progression of his aortic stenosis.  His LDL was at goal at 50.  He experiences fatigue, particularly after physical activities such as gardening and walking. He feels more tired than usual after prolonged activities, like standing for four hours at an  exhibit, but resting improves his energy levels the following day. He describes occasional shortness of breath, which is slightly more pronounced than a year ago, but not severe. No blackouts, lightheadedness, or chest tightness during physical activities.  He feels down, attributing this to concerns about his heart condition and his involvement in church activities, which sometimes involve visiting sick individuals. He has been prescribed duloxetine to help manage these feelings.  He has a history of bilateral knee replacements, chronic disequilibrium, and neuropathy in his feet. Despite these conditions, he remains active, frequently engaging in physical activities and social commitments, including church activities and caring for his grandchildren.  Plan: Not a candidate for Progress cap, follow-up in 6 months.  Discontinue Aggrenox ; start aspirin  81 mg  October 2025:  Patient consents to use of AI scribe. In the interim the patient had another echocardiogram which demonstrated progression with a mean gradient of 25 mmHg and a V-max of 3.1 m/s.  This was reviewed by our research personnel and the patient does qualify for the continuing access program of the PROGRESS trial.  He has been experiencing shortness of breath, most notably during a recent episode while sitting in church. 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 He is generally cautious about his activities.  He experiences fatigue and some shortness of breath.  He has not developed any lightheadedness or presyncope however  He experiences chronic disequilibrium, which he believes is neurological in origin, possibly related to his knee replacements from twelve years ago. This condition contributes significantly to his fatigue. Despite these issues, he stays busy and active he denies any dental issues.          Current Medications: Current Meds  Medication Sig    acetaminophen  (TYLENOL ) 500 MG tablet Take by mouth as needed.   aspirin  EC 81 MG tablet Take 1 tablet (81 mg total) by mouth daily. Swallow whole.   CRESTOR  20 MG tablet Take 20 mg by mouth daily.    DULoxetine (CYMBALTA) 60 MG capsule Take 60 mg by mouth daily.   ezetimibe  (ZETIA ) 10 MG tablet TAKE 1 TABLET(10 MG) BY MOUTH DAILY   gabapentin (NEURONTIN) 100 MG capsule Take 100 mg by mouth daily.   L-Methylfolate-B6-B12 (FOLTANX PO) Take by mouth daily.   metoprolol  succinate (TOPROL  XL) 25 MG 24 hr tablet Take 0.5 tablets (12.5 mg total) by mouth daily.   Multiple Vitamin (MULTI VITAMIN) TABS daily.     Review of Systems:   Please see the history of present illness.    All other systems reviewed and are negative.     EKGs/Labs/Other Test Reviewed:   EKG: EKG performed J October 2024 demonstrates normal sinus rhythm without bundle-branch blocks  EKG Interpretation Date/Time:  Wednesday January 20 2024 14:51:15 EDT Ventricular Rate:  74 PR Interval:  156 QRS Duration:  90 QT Interval:  404 QTC Calculation: 448 R Axis:   -12  Text Interpretation: Normal sinus rhythm Nonspecific ST abnormality When compared with ECG of 14-Aug-2023 13:15, No significant change was found  Confirmed by Wendel Haws ([id number]) on 01/20/2024 2:58:06 PM         Risk Assessment/Calculations:          Physical Exam:   VS:  BP 120/70   Pulse 74   Ht 5' 10 (1.778 m)   Wt 211 lb 12.8 oz (96.1 kg)   SpO2 95%   BMI 30.39 kg/m        Wt Readings from Last 3 Encounters:  01/20/24 211 lb 12.8 oz (96.1 kg)  07/08/23 207 lb (93.9 kg)  01/06/23 205 lb 3.2 oz (93.1 kg)      GENERAL:  No apparent distress, AOx3 HEENT:  No carotid bruits, +2 carotid impulses, no scleral icterus CAR: RRR with 3 out of 6 systolic ejection murmur without gallops, rubs, or thrills RES:  Clear to auscultation bilaterally ABD:  Soft, nontender, nondistended, positive bowel sounds x 4 VASC:  +2 radial pulses, +2 carotid  pulses NEURO:  CN 2-12 grossly intact; motor and sensory grossly intact PSYCH:  No active depression or anxiety EXT:  No edema, ecchymosis, or cyanosis  Signed, Haws MARLA Wendel, MD  01/20/2024 3:37 PM    [facility] [address] [place], [place], [place]  [address] Phone: [phone]; Fax: [phone]   Note:  This document was prepared using Dragon voice recognition software and may include unintentional dictation errors.

## 2024-01-20 ENCOUNTER — Encounter: Payer: Self-pay | Admitting: Internal Medicine

## 2024-01-20 ENCOUNTER — Ambulatory Visit: Attending: Internal Medicine | Admitting: Internal Medicine

## 2024-01-20 VITALS — BP 120/70 | HR 74 | Ht 70.0 in | Wt 211.8 lb

## 2024-01-20 DIAGNOSIS — E785 Hyperlipidemia, unspecified: Secondary | ICD-10-CM | POA: Diagnosis not present

## 2024-01-20 DIAGNOSIS — I350 Nonrheumatic aortic (valve) stenosis: Secondary | ICD-10-CM

## 2024-01-20 DIAGNOSIS — I700 Atherosclerosis of aorta: Secondary | ICD-10-CM

## 2024-01-20 DIAGNOSIS — I701 Atherosclerosis of renal artery: Secondary | ICD-10-CM

## 2024-01-20 DIAGNOSIS — I639 Cerebral infarction, unspecified: Secondary | ICD-10-CM

## 2024-01-20 DIAGNOSIS — I5189 Other ill-defined heart diseases: Secondary | ICD-10-CM

## 2024-01-20 NOTE — Patient Instructions (Signed)
 Medication Instructions:  No medication changes were made at this visit. Continue current regimen.   *If you need a refill on your cardiac medications before your next appointment, please call your pharmacy*  Lab Work: To be completed today: lipid panel and LFT  If you have labs (blood work) drawn today and your tests are completely normal, you will receive your results only by: MyChart Message (if you have MyChart) OR A paper copy in the mail If you have any lab test that is abnormal or we need to change your treatment, we will call you to review the results.  Testing/Procedures: None ordered today.  Follow-Up: At [facility], you and your health needs are our priority.  As part of our continuing mission to provide you with exceptional heart care, our providers are all part of one team.  This team includes your primary Cardiologist (physician) and Advanced Practice Providers or APPs (Physician Assistants and Nurse Practitioners) who all work together to provide you with the care you need, when you need it.  Your next appointment:   6 month(s)  Provider:   Arun Thukkani, MD

## 2024-01-20 NOTE — Progress Notes (Signed)
 Pre Surgical Assessment: 5 M Walk Test  72M=16.10ft  5 Meter Walk Test- trial 1: 4.28 seconds 5 Meter Walk Test- trial 2: 3.53 seconds 5 Meter Walk Test- trial 3: 3.91 seconds 5 Meter Walk Test Average: 3.9 seconds

## 2024-01-21 ENCOUNTER — Ambulatory Visit: Payer: Self-pay | Admitting: Internal Medicine

## 2024-01-21 ENCOUNTER — Other Ambulatory Visit: Payer: Self-pay | Admitting: Physician Assistant

## 2024-01-21 DIAGNOSIS — I350 Nonrheumatic aortic (valve) stenosis: Secondary | ICD-10-CM

## 2024-01-21 LAB — HEPATIC FUNCTION PANEL
ALT: 22 IU/L (ref 0–44)
AST: 28 IU/L (ref 0–40)
Albumin: 4.4 g/dL (ref 3.7–4.7)
Alkaline Phosphatase: 95 IU/L (ref 48–129)
Bilirubin Total: 0.7 mg/dL (ref 0.0–1.2)
Bilirubin, Direct: 0.25 mg/dL (ref 0.00–0.40)
Total Protein: 6.3 g/dL (ref 6.0–8.5)

## 2024-01-21 LAB — LIPID PANEL
Chol/HDL Ratio: 2.3 ratio (ref 0.0–5.0)
Cholesterol, Total: 133 mg/dL (ref 100–199)
HDL: 57 mg/dL (ref >39)
LDL Chol Calc (NIH): 55 mg/dL (ref 0–99)
Triglycerides: 121 mg/dL (ref 0–149)
VLDL Cholesterol Cal: 21 mg/dL (ref 5–40)

## 2024-02-08 ENCOUNTER — Encounter

## 2024-02-08 VITALS — BP 136/62 | HR 71 | Resp 16

## 2024-02-08 DIAGNOSIS — Z006 Encounter for examination for normal comparison and control in clinical research program: Secondary | ICD-10-CM

## 2024-02-08 NOTE — Research (Addendum)
 Progress CAP Informed Consent      Subject Name: Jeffrey Ewing  Subject met inclusion and exclusion criteria.  The informed consent form, study requirements and expectations were reviewed with the subject and questions and concerns were addressed prior to the signing of the consent form. The shared decision making tool was reviewed with the subject. The subject verbalized understanding of the trial requirements.  The subject agreed to participate in the Progress CAP trial and signed the informed consent at 1250 on 02/08/2024.  The informed consent was obtained prior to performance of any protocol-specific procedures for the subject.  A copy of the signed informed consent was given to the subject and a copy was placed in the subject's medical record.   Jeffrey Ewing    INCLUSION:  [x]  12 years of age or older at time of randomization  [x]   Moderate AS per one of the following as assessed by the Echo Core Lab:    A: Aortic valve area (AVA) / Aortic valve area index (AVAi):   AVA 1.0 - 1.5 cm2; OR   AVA < 1.0 with AVAi > 0.6 cm2/m2 if BMI < 30 kg/m2; OR   AVA < 1.0 with AVAi > 0.5 cm2/m2 if BMI >= 30 kg/m2, OR   AVA > 1.5 with AVAi < 0.9 cm2/m2 if BMI < 30 kg/m2; OR   AVA > 1.5 with AVAi < 0.8 cm2/m2 if BMI >= 30 kg/m2   AND  Peak velocity 3.0 - < 4.0 m/s OR mean gradient 20 - < 40 mmHg  OR  B: Subjects who only meet one of the criteria in 2A on resting echo due to low flow state (LVEF < 50% or SVI < 35 mL/m2) are eligible if both criteria are met following dobutamine stress echo (DSE). Note: Subjects with inconclusive results from DSE are eligible upon approval by the Case Review Board.  OR  C: Subjects who only meet one of the criteria in 2A on resting echo and have normal flow (LVEF >= 50% and SVi >= 35 mL/m2) are eligible if non-contrast CT calcium  score is < 1200 AU for women or < 2000 AU for men.  [x]   Subject meets at least one of the following criteria:   Valve-related symptoms  including [place]  Heart Association functional class >= II, dyspnea, angina, dizziness, pre-syncope, or syncope deemed to be related to AS   LVEF < 60% as assessed by the Echo Core Lab   Diastolic dysfunction (>= Grade 2) as assessed by the Echo Core Lab per American Society of Echocardiography Guidelines1 (i.e., E/e' > 14, left atrium volume index > 34 mL/m2, or tricuspid regurgitation velocity > 2.8 m/sec)   Stroke volume index < 35 mL/m2 as assessed by the Echo Core Lab  Persistent atrial fibrillation or any episode of paroxysmal atrial fibrillation within 6 months prior to randomization   NT-Pro BNP > 3x normal   Elevated calcium  score as assessed by Computed Tomography (CT) Core Lab (> 1200 AU for male and > 2000 AU for male) (only applicable for subjects eligible per 2A or 2B above)  [x]  The subject or subject's legal representative has been informed of the nature of the study, agrees to its provisions, and has provided written informed consent   EXCLUSION []  Native aortic annulus size unsuitable for the THV based on computed tomography angiography (CTA) analysis  []  Anatomical characteristics that would preclude safe placement of the introducer sheath or safe passage of the delivery system  []   Aortic valve is unicuspid or non-calcified  []  Bicuspid aortic valve with an aneurysmal ascending aorta > 4.5 cm or severe raphe/leaflet calcification as assessed by CT Core Lab  []  Pre-existing mechanical or bioprosthetic aortic valve  []  Severe aortic regurgitation (>3+)  []  Prior balloon aortic valvuloplasty (BAV) to treat severe AS  []  LVEF < 20% as assessed by the Echo Core Lab  []  Left ventricular outflow tract (LVOT) calcification that would increase the risk of annular rupture or significant paravalvular leak (PVL) post-TAVR  []  Cardiac imaging evidence of intracardiac mass, thrombus, or vegetation  []  Coronary or aortic valve anatomy that increases the risk of coronary artery obstruction  post-TAVR  []  Myocardial infarction within 30 days prior to randomization  []  Hypertrophic cardiomyopathy with sub-valvular obstruction or restrictive cardiomyopathy  []  Any concomitant valvular disease requiring surgical or transcatheter intervention.  []  Significant untreated coronary artery disease requiring revascularization  []  Any surgical or transcatheter procedure within 30 days prior to randomization  []  Active bacterial endocarditis within 180 days prior to randomization  []  Stroke or transient ischemic attack (TIA) within 90 days prior to randomization  []  Symptomatic carotid or vertebral artery disease or successful treatment of carotid stenosis within 30 days prior to randomization  []  Severe chronic obstructive pulmonary disease (COPD, Forced Ejection Volume 1 [FEV1] < 50% predicted) or currently on home oxygen  []  Hemodynamic or respiratory instability requiring inotropic or mechanical support within 30 days prior to randomization  []  Liver disease (cirrhosis of the liver [Child-Pugh class B or C])  []  Renal insufficiency (estimated Glomerular Filtration Rate [eGFR] < 30 mL/min/1.73 m2) and/or renal replacement therapy  []  Significant frailty as determined by the Heart Team  []  Leukopenia (White blood cells < 3000 cells/mL), anemia (Hemoglobin < 9 g/dL), thrombocytopenia (platelets < 50,000 cells/mL)  []  Inability to tolerate or condition precluding treatment with antithrombotic therapy (including single antiplatelet therapy)  []  Hypercoagulable state or other condition that increases risk of thrombosis  []  Absolute contraindications or allergy to iodinated contrast that cannot be adequately treated with pre-medication  []  Subject refuses blood products  []  Body mass index (BMI) > 50 kg/m2  []  Estimated life expectancy < 24 months  []  Active SARS-CoV-2 infection (Coronavirus-19 [COVID-19]) or previously diagnosed with COVID-19 with sequelae that could confound endpoint assessments  (as assessed by the Case Review Board)  []  Participating in another drug or device study that has not reached its primary endpoint  []  Subject considered to be part of a vulnerable population    Progress Screening Visit    Medical History: 1. Nonrheumatic aortic valve stenosis   2. Diastolic dysfunction   3. Hyperlipidemia LDL goal <55   4. Aortic atherosclerosis   5. Cryptogenic stroke (HCC)   6. Renal artery stenosis     Medications:  Current Outpatient Medications:    acetaminophen  (TYLENOL ) 500 MG tablet, Take by mouth as needed., Disp: , Rfl:    aspirin  EC 81 MG tablet, Take 1 tablet (81 mg total) by mouth daily. Swallow whole., Disp: , Rfl:    CRESTOR  20 MG tablet, Take 20 mg by mouth daily. , Disp: , Rfl:    DULoxetine (CYMBALTA) 60 MG capsule, Take 60 mg by mouth daily., Disp: , Rfl:    ezetimibe  (ZETIA ) 10 MG tablet, TAKE 1 TABLET(10 MG) BY MOUTH DAILY, Disp: 90 tablet, Rfl: 1   gabapentin (NEURONTIN) 100 MG capsule, Take 100 mg by mouth daily., Disp: , Rfl:    L-Methylfolate-B6-B12 (FOLTANX PO),  Take by mouth daily., Disp: , Rfl:    metoprolol  succinate (TOPROL  XL) 25 MG 24 hr tablet, Take 0.5 tablets (12.5 mg total) by mouth daily., Disp: 60 tablet, Rfl: 2   Multiple Vitamin (MULTI VITAMIN) TABS, daily., Disp: , Rfl:     Society of Thoracic Surgeons Score:   European System for Cardiac Operative Risk Evaluation (EuroSCORE) ll: Pending   Surgical Risk assessed by Heart Team:  []  - Low [x]  - Intermediate []  - High []  - Extreme   New York  Heart Association  (NYHA) ll  Canadian Cardiovascular Society Score (CCS) angina grade: l   Transthoracic echocardiogram (TTE), including DSE as applicable: 01/10/2024   12- lead electrocardiogram: 02/08/2024   Cardiac CTA with 3D within 1 year: 02/09/2024     PFT: N/A  NIHSS MRS Completed 02/08/2024  Labs: See labs  KCCQ: See worksheet   [id number]: See worksheet   SF-36:  See  worksheet

## 2024-02-09 ENCOUNTER — Ambulatory Visit (HOSPITAL_COMMUNITY)
Admission: RE | Admit: 2024-02-09 | Discharge: 2024-02-09 | Disposition: A | Discharge status: Home / Self Care | Source: Ambulatory Visit | Attending: Cardiovascular Disease | Admitting: Cardiovascular Disease

## 2024-02-09 ENCOUNTER — Ambulatory Visit: Payer: Self-pay | Admitting: Internal Medicine

## 2024-02-09 DIAGNOSIS — I350 Nonrheumatic aortic (valve) stenosis: Secondary | ICD-10-CM

## 2024-02-09 LAB — CBC
Hematocrit: 41.5 % (ref 37.5–51.0)
Hemoglobin: 14 g/dL (ref 13.0–17.7)
MCH: 36.1 pg — ABNORMAL HIGH (ref 26.6–33.0)
MCHC: 33.7 g/dL (ref 31.5–35.7)
MCV: 107 fL — ABNORMAL HIGH (ref 79–97)
Platelets: 119 x10E3/uL — ABNORMAL LOW (ref 150–450)
RBC: 3.88 x10E6/uL — ABNORMAL LOW (ref 4.14–5.80)
RDW: 13.3 % (ref 11.6–15.4)
WBC: 7.5 x10E3/uL (ref 3.4–10.8)

## 2024-02-09 LAB — COMPREHENSIVE METABOLIC PANEL WITH GFR
ALT: 24 IU/L (ref 0–44)
AST: 32 IU/L (ref 0–40)
Albumin: 4.4 g/dL (ref 3.7–4.7)
Alkaline Phosphatase: 91 IU/L (ref 48–129)
BUN/Creatinine Ratio: 11 (ref 10–24)
BUN: 11 mg/dL (ref 8–27)
Bilirubin Total: 0.6 mg/dL (ref 0.0–1.2)
CO2: 22 mmol/L (ref 20–29)
Calcium: 9.3 mg/dL (ref 8.6–10.2)
Chloride: 100 mmol/L (ref 96–106)
Creatinine, Ser: 0.98 mg/dL (ref 0.76–1.27)
Globulin, Total: 1.9 g/dL (ref 1.5–4.5)
Glucose: 117 mg/dL — ABNORMAL HIGH (ref 70–99)
Potassium: 4.5 mmol/L (ref 3.5–5.2)
Sodium: 137 mmol/L (ref 134–144)
Total Protein: 6.3 g/dL (ref 6.0–8.5)
eGFR: 77 mL/min/1.73 (ref >59)

## 2024-02-09 LAB — PRO B NATRIURETIC PEPTIDE: NT-Pro BNP: 162 pg/mL (ref 0–486)

## 2024-02-09 MED ORDER — IOHEXOL 350 MG/ML SOLN
100.0000 mL | Freq: Once | INTRAVENOUS | Status: AC | PRN
Start: 2024-02-09 — End: 2024-02-09
  Administered 2024-02-09: 100 mL via INTRAVENOUS

## 2024-02-09 NOTE — Progress Notes (Signed)
 Procedure Type: Isolated AVR Perioperative Outcome Estimate % Operative Mortality 2.95% Morbidity & Mortality 8.98% Stroke 1.64% Renal Failure 1.94% Reoperation 4.38% Prolonged Ventilation 4.42% Deep Sternal Wound Infection 0.076% Long Hospital Stay (>14 days) 4.45% Short Hospital Stay (<6 days)* 39.2%

## 2024-02-18 ENCOUNTER — Telehealth: Payer: Self-pay

## 2024-02-18 NOTE — Telephone Encounter (Signed)
 Jeffrey Ewing and I reviewed these risks of a heart cath with the patient over the phone:  Risks include but are not limited to bleeding, infection, vascular injury, stroke, myocardial infection, arrhythmia, kidney injury, radiation-related injury in the case of prolonged fluoroscopy use, emergency cardiac surgery, and death. The patient understands the risks of serious complication is 1-2 in 1000 with diagnostic cardiac cath and 1-2% or less with angioplasty/stenting. Patient is aware of the risks and he consents to a heart cath on 02/23/24.

## 2024-02-23 ENCOUNTER — Other Ambulatory Visit: Payer: Self-pay

## 2024-02-23 ENCOUNTER — Encounter (HOSPITAL_COMMUNITY)
Admission: RE | Disposition: A | Discharge status: Home / Self Care | Payer: Self-pay | Source: Home / Self Care | Attending: Cardiovascular Disease

## 2024-02-23 ENCOUNTER — Ambulatory Visit (HOSPITAL_COMMUNITY)
Admission: RE | Admit: 2024-02-23 | Discharge: 2024-02-23 | Disposition: A | Discharge status: Home / Self Care | Attending: Cardiovascular Disease | Admitting: Cardiovascular Disease

## 2024-02-23 DIAGNOSIS — I2510 Atherosclerotic heart disease of native coronary artery without angina pectoris: Secondary | ICD-10-CM | POA: Diagnosis not present

## 2024-02-23 HISTORY — PX: RIGHT/LEFT HEART CATH AND CORONARY ANGIOGRAPHY: CATH118266

## 2024-02-23 LAB — POCT I-STAT EG7
Acid-Base Excess: 0 mmol/L (ref 0.0–2.0)
Bicarbonate: 25.7 mmol/L (ref 20.0–28.0)
Calcium, Ion: 1.14 mmol/L — ABNORMAL LOW (ref 1.15–1.40)
HCT: 38 % — ABNORMAL LOW (ref 39.0–52.0)
Hemoglobin: 12.9 g/dL — ABNORMAL LOW (ref 13.0–17.0)
O2 Saturation: 73 %
Potassium: 4.1 mmol/L (ref 3.5–5.1)
Sodium: 135 mmol/L (ref 135–145)
TCO2: 27 mmol/L (ref 22–32)
pCO2, Ven: 42.4 mmHg — ABNORMAL LOW (ref 44–60)
pH, Ven: 7.39 (ref 7.25–7.43)
pO2, Ven: 39 mmHg (ref 32–45)

## 2024-02-23 LAB — POCT I-STAT 7, (LYTES, BLD GAS, ICA,H+H)
Acid-Base Excess: 1 mmol/L (ref 0.0–2.0)
Bicarbonate: 25.1 mmol/L (ref 20.0–28.0)
Calcium, Ion: 1.18 mmol/L (ref 1.15–1.40)
HCT: 39 % (ref 39.0–52.0)
Hemoglobin: 13.3 g/dL (ref 13.0–17.0)
O2 Saturation: 94 %
Potassium: 4.2 mmol/L (ref 3.5–5.1)
Sodium: 134 mmol/L — ABNORMAL LOW (ref 135–145)
TCO2: 26 mmol/L (ref 22–32)
pCO2 arterial: 39.5 mmHg (ref 32–48)
pH, Arterial: 7.412 (ref 7.35–7.45)
pO2, Arterial: 69 mmHg — ABNORMAL LOW (ref 83–108)

## 2024-02-23 SURGERY — RIGHT/LEFT HEART CATH AND CORONARY ANGIOGRAPHY
Anesthesia: LOCAL

## 2024-02-23 MED ORDER — IOHEXOL 350 MG/ML SOLN
INTRAVENOUS | Status: DC | PRN
Start: 2024-02-23 — End: 2024-02-23
  Administered 2024-02-23: 55 mL

## 2024-02-23 MED ORDER — SODIUM CHLORIDE 0.9 % IV SOLN: 250.0000 mL | INTRAVENOUS | Status: DC | PRN

## 2024-02-23 MED ORDER — ACETAMINOPHEN 325 MG PO TABS
650.0000 mg | ORAL_TABLET | ORAL | Status: DC | PRN
Start: 2024-02-23 — End: 2024-02-23

## 2024-02-23 MED ORDER — LIDOCAINE HCL (PF) 1 % IJ SOLN
INTRAMUSCULAR | Status: AC
  Filled 2024-02-23: qty 30

## 2024-02-23 MED ORDER — FREE WATER: 500.0000 mL | Freq: Once | Status: DC

## 2024-02-23 MED ORDER — SODIUM CHLORIDE 0.9% FLUSH: 3.0000 mL | Freq: Two times a day (BID) | INTRAVENOUS | Status: DC

## 2024-02-23 MED ORDER — MIDAZOLAM HCL 2 MG/2ML IJ SOLN
INTRAMUSCULAR | Status: AC
  Filled 2024-02-23: qty 2

## 2024-02-23 MED ORDER — ASPIRIN 81 MG PO CHEW
81.0000 mg | CHEWABLE_TABLET | ORAL | Status: DC
Start: 2024-02-23 — End: 2024-02-23

## 2024-02-23 MED ORDER — MIDAZOLAM HCL (PF) 2 MG/2ML IJ SOLN
INTRAMUSCULAR | Status: DC | PRN
  Administered 2024-02-23: 1 mg via INTRAVENOUS

## 2024-02-23 MED ORDER — ONDANSETRON HCL 4 MG/2ML IJ SOLN: 4.0000 mg | Freq: Four times a day (QID) | INTRAMUSCULAR | Status: DC | PRN

## 2024-02-23 MED ORDER — LABETALOL HCL 5 MG/ML IV SOLN
10.0000 mg | INTRAVENOUS | Status: DC | PRN
Start: 2024-02-23 — End: 2024-02-23

## 2024-02-23 MED ORDER — HYDRALAZINE HCL 20 MG/ML IJ SOLN: 10.0000 mg | INTRAMUSCULAR | Status: DC | PRN

## 2024-02-23 MED ORDER — SODIUM CHLORIDE 0.9% FLUSH: 3.0000 mL | INTRAVENOUS | Status: DC | PRN

## 2024-02-23 MED ORDER — HEPARIN SODIUM (PORCINE) 1000 UNIT/ML IJ SOLN
INTRAMUSCULAR | Status: DC | PRN
Start: 2024-02-23 — End: 2024-02-23
  Administered 2024-02-23: 5000 [IU] via INTRAVENOUS

## 2024-02-23 MED ORDER — VERAPAMIL HCL 2.5 MG/ML IV SOLN
INTRAVENOUS | Status: AC
  Filled 2024-02-23: qty 2

## 2024-02-23 MED ORDER — LIDOCAINE HCL (PF) 1 % IJ SOLN
INTRAMUSCULAR | Status: DC | PRN
Start: 2024-02-23 — End: 2024-02-23
  Administered 2024-02-23 (×2): 5 mL via INTRADERMAL

## 2024-02-23 MED ORDER — FENTANYL CITRATE (PF) 100 MCG/2ML IJ SOLN
INTRAMUSCULAR | Status: DC | PRN
  Administered 2024-02-23: 25 ug via INTRAVENOUS

## 2024-02-23 MED ORDER — HEPARIN SODIUM (PORCINE) 1000 UNIT/ML IJ SOLN
INTRAMUSCULAR | Status: AC
  Filled 2024-02-23: qty 10

## 2024-02-23 MED ORDER — HEPARIN (PORCINE) IN NACL 2-0.9 UNITS/ML
INTRAMUSCULAR | Status: DC | PRN
  Administered 2024-02-23: 10 mL via INTRA_ARTERIAL

## 2024-02-23 MED ORDER — HEPARIN (PORCINE) IN NACL 1000-0.9 UT/500ML-% IV SOLN
INTRAVENOUS | Status: DC | PRN
  Administered 2024-02-23: 1000 mL

## 2024-02-23 MED ORDER — FENTANYL CITRATE (PF) 100 MCG/2ML IJ SOLN
INTRAMUSCULAR | Status: AC
  Filled 2024-02-23: qty 2

## 2024-02-23 SURGICAL SUPPLY — 8 items
CATH 5FR JL3.5 JR4 ANG PIG MP (CATHETERS) IMPLANT
CATH BALLN WEDGE 5F 110CM (CATHETERS) IMPLANT
DEVICE RAD COMP TR BAND LRG (VASCULAR PRODUCTS) IMPLANT
GLIDESHEATH SLEND SS 6F .021 (SHEATH) IMPLANT
GUIDEWIRE INQWIRE 1.5J.035X260 (WIRE) IMPLANT
PACK CARDIAC CATHETERIZATION (CUSTOM PROCEDURE TRAY) ×1 IMPLANT
SET ATX-X65L (MISCELLANEOUS) IMPLANT
SHEATH GLIDE SLENDER 4/5FR (SHEATH) IMPLANT

## 2024-02-23 NOTE — Discharge Instructions (Signed)

## 2024-02-23 NOTE — H&P (Signed)
 All Notes   Progress Notes by Izetta Concetta KIDD, CMA at 01/20/2024 2:40 PM  Author: Izetta Concetta KIDD, CMA Author Type: Certified Medical Assistant Filed: 02/09/2024 10:38 AM  Note Status: Signed Cosign: Cosign Not Required Encounter Date: 01/20/2024  Editor: Izetta Concetta KIDD, CMA (Certified Medical Assistant)             Procedure Type: Isolated AVR Perioperative OutcomeEstimate % Operative Mortality2.95% Morbidity & Mortality8.98% Stroke1.64% Renal Failure1.94% Reoperation4.38% Prolonged Ventilation4.42% Deep Sternal Wound Infection0.076% Long Hospital Stay (>14 days)4.45% [facility] Stay (<6 days)*39.2%        Progress Notes by Gordon Ronal SQUIBB, RN at 01/20/2024 2:40 PM  Author: Gordon Ronal SQUIBB, RN Author Type: Registered Nurse Filed: 01/20/2024  3:37 PM  Note Status: Signed Cosign: Cosign Not Required Encounter Date: 01/20/2024  Editor: Gordon Ronal SQUIBB, RN (Registered Nurse)             Pre Surgical Assessment: 5 M Walk Test   33M=16.61ft   5 Meter Walk Test- trial 1: 4.28 seconds 5 Meter Walk Test- trial 2: 3.53 seconds 5 Meter Walk Test- trial 3: 3.91 seconds 5 Meter Walk Test Average: 3.9 seconds          Progress Notes by Wendel Lurena POUR, MD at 01/20/2024 2:40 PM  Author: Thukkani, Arun K, MD Author Type: Physician Filed: 01/20/2024  3:37 PM  Note Status: Signed Cosign: Cosign Not Required Encounter Date: 01/20/2024  Editor: Thukkani, Arun K, MD (Physician)             Expand All Collapse All   Cardiology Office Note:   Date:  01/20/2024  ID:  Jeffrey Ewing, DOB Oct 09, 1940, MRN [id number] PCP:  Shayne Anes, MD           [facility] Providers Cardiologist:  Wendel Lurena, MD Referring MD: Shayne Anes, MD  Chief Complaint/Reason for Referral: Aortic stenosis ASSESSMENT:     1. Nonrheumatic aortic valve stenosis   2. Diastolic dysfunction   3. Hyperlipidemia LDL goal <55   4. Aortic atherosclerosis   5. Cryptogenic stroke (HCC)   6. Renal artery  stenosis           PLAN:   In order of problems listed above: Aortic stenosis: The patient endorses NYHA II symptoms of fatigue and CCS 1 angina.  He qualifies for the continuing access program for progress trial.  Will have research personnel reach out to him as he is interested. Diastolic dysfunction: Continue Toprol  12.5 mg, could start ARB in the future Hyperlipidemia: Continue Crestor  20 mg daily and Zetia  10 mg daily; check lipid panel, LFTs today.  Defer LP(a) testing in this advanced age individual.  Goal LDL is less than 55 given history of stroke. Aortic atherosclerosis: Continue ASA 81, Crestor  20 mg, Zetia  10 mg, and blood pressure control.   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  Continue aspirin  81 mg.   Renal artery stenosis: Continue Crestor  20 mg, aspirin  81 mg, monitor blood pressure.                 Dispo:  Return in about 6 months (around 07/20/2024).    Objective Medication Adjustments/Labs and Tests Ordered: Current medicines are reviewed at length with the patient today.  Concerns regarding medicines are outlined above.   The following changes have been made:  no change    Labs/tests ordered:    Orders Placed This Encounter  Procedures   Lipid panel   Hepatic function panel  EKG 12-Lead      Medication Changes: No orders of the defined types were placed in this encounter.     Current medicines are reviewed at length with the patient today.  The patient does not have concerns regarding medicines.   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  The patient is doing well.  He is quite active denies any shortness of breath, chest discomfort, presyncope or syncope.  He is very concerned about the development of aortic valvular disease and is somewhat anxious about it.  He has had no issues with his other medications.  Interestingly he still remains on Aggrenox  despite a TIA almost 20 years ago.  He has had no severe bleeding or bruising with this medication.  He is tolerating his rosuvastatin  without myalgias or arthralgias.  He fortunately has not required any emergency room visits or hospitalizations.  He does not smoke currently.  He does not drink.  He is otherwise well without significant complaints today.  Plan: Consider changing from Aggrenox  to aspirin .  Obtain echocardiogram to evaluate AS.   April 2025:  Patient consents to use of AI scribe. The patient returns for routine follow-up.  In the interim his echocardiogram demonstrated progression of his aortic stenosis.  His LDL was at goal at 50.   He experiences fatigue, particularly after physical activities such as gardening and walking. He feels more tired than usual after prolonged activities, like standing for four hours at an exhibit, but resting improves his energy levels the following day. He describes occasional shortness of breath, which is slightly more pronounced than a year ago, but not severe. No blackouts, lightheadedness, or chest tightness during physical activities.   He feels down, attributing this to concerns about his heart condition and his involvement in church activities, which sometimes involve visiting sick individuals. He  has been prescribed duloxetine to help manage these feelings.   He has a history of bilateral knee replacements, chronic disequilibrium, and neuropathy in his feet. Despite these conditions, he remains active, frequently engaging in physical activities and social commitments, including church activities and caring for his grandchildren.  Plan: Not a candidate for Progress cap, follow-up in 6 months.  Discontinue Aggrenox ; start aspirin  81 mg   October 2025:  Patient consents to use of AI scribe. In the interim the patient had another echocardiogram which demonstrated progression with a mean gradient of 25 mmHg and a V-max of 3.1 m/s.  This was reviewed by our research personnel and the patient does qualify for the continuing access program of the PROGRESS trial.   He has been experiencing shortness of breath, most notably during a recent episode while sitting in church. 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 He is generally  cautious about his activities.   He experiences fatigue and some shortness of breath.  He has not developed any lightheadedness or presyncope however   He experiences chronic disequilibrium, which he believes is neurological in origin, possibly related to his knee replacements from twelve years ago. This condition contributes significantly to his fatigue. Despite these issues, he stays busy and active he denies any dental issues.           Objective Current Medications: Active Medications      Current Meds  Medication Sig   acetaminophen  (TYLENOL ) 500 MG tablet Take by mouth as needed.   aspirin  EC 81 MG tablet Take 1 tablet (81 mg total) by mouth daily. Swallow whole.   CRESTOR  20 MG tablet Take 20 mg by mouth daily.    DULoxetine (CYMBALTA) 60 MG capsule Take 60 mg by mouth daily.   ezetimibe  (ZETIA ) 10 MG tablet TAKE 1 TABLET(10 MG) BY MOUTH DAILY   gabapentin (NEURONTIN) 100 MG capsule Take 100 mg by  mouth daily.   L-Methylfolate-B6-B12 (FOLTANX PO) Take by mouth daily.   metoprolol  succinate (TOPROL  XL) 25 MG 24 hr tablet Take 0.5 tablets (12.5 mg total) by mouth daily.   Multiple Vitamin (MULTI VITAMIN) TABS daily.        Review of Systems:   Please see the history of present illness.    All other systems reviewed and are negative.        EKGs/Labs/Other Test Reviewed:   EKG: EKG performed J October 2024 demonstrates normal sinus rhythm without bundle-branch blocks   EKG Interpretation Date/Time:                      Wednesday January 20 2024 14:51:15 EDT Ventricular Rate:   74 PR Interval:                      156 QRS Duration:                    90 QT Interval:                      404 QTC Calculation:448 R Axis:                         -12   Text Interpretation:Normal sinus rhythm Nonspecific ST abnormality When compared with ECG of 14-Aug-2023 13:15, No significant change was found Confirmed by Wendel Haws ([id number]) on 01/20/2024 2:58:06 PM               Risk Assessment/Calculations:           Physical Exam:   VS:  BP 120/70   Pulse 74   Ht 5' 10 (1.778 m)   Wt 211 lb 12.8 oz (96.1 kg)   SpO2 95%   BMI 30.39 kg/m           Wt Readings from Last 3 Encounters:  01/20/24 211 lb 12.8 oz (96.1 kg)  07/08/23 207 lb (93.9 kg)  01/06/23 205 lb 3.2 oz (93.1 kg)       GENERAL:  No apparent distress, AOx3 HEENT:  No carotid bruits, +2 carotid impulses, no scleral icterus CAR: RRR with 3 out of 6 systolic ejection murmur without gallops, rubs, or thrills RES:  Clear to auscultation bilaterally ABD:  Soft, nontender, nondistended, positive bowel sounds x 4 VASC:  +2 radial pulses, +2 carotid pulses NEURO:  CN 2-12  grossly intact; motor and sensory grossly intact PSYCH:  No active depression or anxiety EXT:  No edema, ecchymosis, or cyanosis   Signed, Arun K Thukkani, MD  01/20/2024 3:37 PM    [facility] [address] [place],  [place], [place]  [address] Phone: [phone]; Fax: [phone]    Note:  This document was prepared using Dragon voice recognition software and may include unintentional dictation errors.         Patient Instructions by Gordon Ronal SQUIBB, RN at 01/20/2024 2:40 PM  Author: Gordon Ronal SQUIBB, RN Author Type: Registered Nurse Filed: 01/20/2024  3:27 PM  Note Status: Signed Cosign: Cosign Not Required Encounter Date: 01/20/2024  Editor: Gordon Ronal SQUIBB, RN (Registered Nurse)             Medication Instructions:  No medication changes were made at this visit. Continue current regimen.    *If you need a refill on your cardiac medications before your next appointment, please call your pharmacy*   Lab Work: To be completed today: lipid panel and LFT   If you have labs (blood work) drawn today and your tests are completely normal, you will receive your results only by: MyChart Message (if you have MyChart) OR A paper copy in the mail If you have any lab test that is abnormal or we need to change your treatment, we will call you to review the results.   Testing/Procedures: None ordered today.   Follow-Up: At [facility], you and your health needs are our priority.  As part of our continuing mission to provide you with exceptional heart care, our providers are all part of one team.  This team includes your primary Cardiologist (physician) and Advanced Practice Providers or APPs (Physician Assistants and Nurse Practitioners) who all work together to provide you with the care you need, when you need it.   Your next appointment:   6 month(s)   Provider:   Lurena Red, MD           Instructions   Return in about 6 months (around 07/20/2024). Medication Instructions:  No medication changes were made at this visit. Continue current regimen.      Addendum: Patient independently interviewed and examined.  No changes since this H&P last month.  Patient medically stable to proceed  with right and left heart catheterization as outlined above.  Jeffrey Ewing 02/23/2024 9:33 AM

## 2024-02-23 NOTE — Progress Notes (Signed)
 Patient and wife was given discharge instructions. Both verbalized understanding.

## 2024-02-24 ENCOUNTER — Encounter (HOSPITAL_COMMUNITY): Payer: Self-pay | Admitting: Cardiovascular Disease

## 2024-03-09 ENCOUNTER — Other Ambulatory Visit: Payer: Self-pay

## 2024-03-09 DIAGNOSIS — D696 Thrombocytopenia, unspecified: Secondary | ICD-10-CM

## 2024-03-10 ENCOUNTER — Inpatient Hospital Stay: Attending: Internal Medicine | Admitting: Internal Medicine

## 2024-03-10 ENCOUNTER — Inpatient Hospital Stay

## 2024-03-10 VITALS — HR 72 | Temp 97.7°F | Resp 17 | Ht 71.0 in | Wt 212.0 lb

## 2024-03-10 DIAGNOSIS — Z8546 Personal history of malignant neoplasm of prostate: Secondary | ICD-10-CM | POA: Insufficient documentation

## 2024-03-10 DIAGNOSIS — D696 Thrombocytopenia, unspecified: Secondary | ICD-10-CM | POA: Diagnosis present

## 2024-03-10 DIAGNOSIS — Z9079 Acquired absence of other genital organ(s): Secondary | ICD-10-CM | POA: Insufficient documentation

## 2024-03-10 DIAGNOSIS — Z87891 Personal history of nicotine dependence: Secondary | ICD-10-CM | POA: Insufficient documentation

## 2024-03-10 LAB — CMP (CANCER CENTER ONLY)
ALT: 22 U/L (ref 0–44)
AST: 29 U/L (ref 15–41)
Albumin: 4.5 g/dL (ref 3.5–5.0)
Alkaline Phosphatase: 88 U/L (ref 38–126)
Anion gap: 9 (ref 5–15)
BUN: 12 mg/dL (ref 8–23)
CO2: 28 mmol/L (ref 22–32)
Calcium: 9.7 mg/dL (ref 8.9–10.3)
Chloride: 99 mmol/L (ref 98–111)
Creatinine: 1.03 mg/dL (ref 0.61–1.24)
GFR, Estimated: >60 mL/min (ref >60)
Glucose, Bld: 112 mg/dL — ABNORMAL HIGH (ref 70–99)
Potassium: 4.5 mmol/L (ref 3.5–5.1)
Sodium: 137 mmol/L (ref 135–145)
Total Bilirubin: 0.7 mg/dL (ref 0.0–1.2)
Total Protein: 6.8 g/dL (ref 6.5–8.1)

## 2024-03-10 LAB — CBC WITH DIFFERENTIAL (CANCER CENTER ONLY)
Abs Immature Granulocytes: 0.01 K/uL (ref 0.00–0.07)
Basophils Absolute: 0.1 K/uL (ref 0.0–0.1)
Basophils Relative: 1 %
Eosinophils Absolute: 0.2 K/uL (ref 0.0–0.5)
Eosinophils Relative: 3 %
HCT: 40.2 % (ref 39.0–52.0)
Hemoglobin: 13.9 g/dL (ref 13.0–17.0)
Immature Granulocytes: 0 %
Lymphocytes Relative: 18 %
Lymphs Abs: 1.3 K/uL (ref 0.7–4.0)
MCH: 35.1 pg — ABNORMAL HIGH (ref 26.0–34.0)
MCHC: 34.6 g/dL (ref 30.0–36.0)
MCV: 101.5 fL — ABNORMAL HIGH (ref 80.0–100.0)
Monocytes Absolute: 0.8 K/uL (ref 0.1–1.0)
Monocytes Relative: 11 %
Neutro Abs: 5 K/uL (ref 1.7–7.7)
Neutrophils Relative %: 67 %
Platelet Count: 105 K/uL — ABNORMAL LOW (ref 150–400)
RBC: 3.96 MIL/uL — ABNORMAL LOW (ref 4.22–5.81)
RDW: 13.2 % (ref 11.5–15.5)
WBC Count: 7.3 K/uL (ref 4.0–10.5)
nRBC: 0 % (ref 0.0–0.2)

## 2024-03-10 NOTE — Progress Notes (Signed)
  [facility] [phone]   [phone]  CONSULT NOTE  REFERRING PHYSICIAN: Dr. Oneil Neth  REASON FOR CONSULTATION:  83 years old white male with thrombocytopenia  HPI Jeffrey Ewing is a 83 y.o. male.   HPI  Discussed the use of AI scribe software for clinical note transcription with the patient, who gave verbal consent to proceed.  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  Chronic thrombocytopenia was first identified in 2005 following a transient ischemic attack and initiation of Aggrenox . Platelet counts have remained persistently low for approximately 20 years, with recent values ranging from 85,000 to 116,000 over the past year. He transitioned from Aggrenox  to low-dose aspirin  on cardiology recommendation, but thrombocytopenia persisted. He is currently taking Crestor  and Zetia  for hyperlipidemia.  He has tolerated two knee surgeries and a shoulder surgery with thrombocytopenia, requiring PRBCs transfusion only for the shoulder procedure. He experiences easy bruising and occasional bleeding with minor trauma, but notes improvement in bleeding since switching to aspirin . Bruising resolves within one week and he denies distress from symptoms, expressing concern primarily about laboratory values.  Aortic valve stenosis has been monitored for approximately one year, with progressive decline in valve area from 1.6-1.7 cm to 1.15 cm. He is considering aortic valve replacement and inquired about the safety of the procedure given his platelet count. He remains physically active and denies chest pain, dyspnea, or syncope.  Prostate cancer was diagnosed around 2010 and managed with robotic prostatectomy. He has maintained an undetectable PSA since surgery and was discharged from urology follow-up as cancer free. The  patient is married and has 3 children.  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  He quit smoking in 2005 when he has TIA.  He drinks alcohol occasionally and no history of drug abuse.    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 2013   JOINT REPLACEMENT Bilateral    KNEE ARTHROSCOPY  2009   left   MASS EXCISION Right 04/03/2016   Procedure: EXCISION cyst right index finger abd debridement proximal interphalangeal;  Surgeon: Murrell Kuba, MD;  Location: [facility];  Service: Orthopedics;  Laterality: Right;  FAB   PROSTATECTOMY  2008   removal melanoma  2001   left cheek   REVERSE SHOULDER ARTHROPLASTY Right 03/04/2019   Procedure: REVERSE SHOULDER ARTHROPLASTY;  Surgeon: Kay Kemps, MD;  Location: [facility];  Service: Orthopedics;  Laterality: Right;   RIGHT/LEFT HEART CATH AND CORONARY ANGIOGRAPHY N/A 02/23/2024   Procedure: RIGHT/LEFT HEART CATH AND CORONARY ANGIOGRAPHY;  Surgeon: Wonda Sharper, MD;  Location: [facility] INVASIVE CV LAB;  Service: Cardiovascular;  Laterality: N/A;   TONSILLECTOMY  as child   TOTAL KNEE ARTHROPLASTY Right 12/20/2012   Procedure:  RIGHT TOTAL KNEE ARTHROPLASTY;  Surgeon: Dempsey LULLA Moan, MD;  Location: [facility];  Service: Orthopedics;  Laterality: Right;   TOTAL KNEE ARTHROPLASTY Bilateral    VASECTOMY  1978    Family History  Problem Relation Age of Onset   Alzheimer's disease Mother    Heart attack Father    Hypertension Father     CAD Father    Colon cancer Neg Hx    Stomach cancer Neg Hx    Esophageal cancer Neg Hx    Rectal cancer Neg Hx     Social History Social History[1]  Allergies[2]  Current Outpatient Medications  Medication Sig Dispense Refill   acetaminophen  (TYLENOL ) 500 MG tablet Take 500 mg by mouth every 8 (eight) hours as needed for mild pain (pain score 1-3), moderate pain (pain score 4-6) or headache.     aspirin  EC 81 MG tablet Take 1 tablet (81 mg total) by mouth daily. Swallow whole.     CRESTOR  20 MG tablet Take 20 mg by mouth daily.      DULoxetine (CYMBALTA) 60 MG capsule Take 60 mg by mouth daily.     ezetimibe  (ZETIA ) 10 MG tablet TAKE 1 TABLET(10 MG) BY MOUTH DAILY 90 tablet 1   gabapentin (NEURONTIN) 100 MG capsule Take 100 mg by mouth daily as needed (Nerve Pain).     L-Methylfolate-B6-B12 (FOLTANX PO) Take 1 capsule by mouth daily.     metoprolol  succinate (TOPROL  XL) 25 MG 24 hr tablet Take 0.5 tablets (12.5 mg total) by mouth daily. 60 tablet 2   Multiple Vitamin (MULTI VITAMIN) TABS Take 1 tablet by mouth daily.     No current facility-administered medications for this visit.    Review of Systems  Constitutional: negative Eyes: negative Ears, nose, mouth, throat, and face: negative Respiratory: negative Cardiovascular: negative Gastrointestinal: negative Genitourinary:negative Integument/breast: negative Hematologic/lymphatic: negative Musculoskeletal:positive for arthralgias Neurological: negative Behavioral/Psych: negative Endocrine: negative Allergic/Immunologic: negative  Physical Exam  MJO:jozmu, healthy, no distress, well nourished, and well developed SKIN: skin color, texture, turgor are normal, no rashes or significant lesions HEAD: Normocephalic, No masses, lesions, tenderness or abnormalities EYES: normal, PERRLA, Conjunctiva are pink and non-injected EARS: External ears normal, Canals clear OROPHARYNX:no exudate, no erythema, and lips, buccal  mucosa, and tongue normal  NECK: supple, no adenopathy, no JVD LYMPH:  no palpable lymphadenopathy, no hepatosplenomegaly LUNGS: clear to auscultation , and palpation HEART: regular rate & rhythm, systolic murmur, and no gallops ABDOMEN:abdomen soft, non-tender, normal bowel sounds, and no masses or organomegaly BACK: Back symmetric, no curvature., No CVA tenderness EXTREMITIES:no joint deformities, effusion, or inflammation, no edema  NEURO: alert & oriented x 3 with fluent speech, no focal motor/sensory deficits  PERFORMANCE STATUS: ECOG 1  LABORATORY DATA: Lab Results  Component Value Date   WBC 7.3 03/10/2024   HGB 13.9 03/10/2024   HCT 40.2 03/10/2024   MCV 101.5 (H) 03/10/2024   PLT 105 (L) 03/10/2024      Chemistry      Component Value Date/Time   NA 137 03/10/2024 1102   NA 137 02/08/2024 1320   K 4.5 03/10/2024 1102   CL 99 03/10/2024 1102   CO2 28 03/10/2024 1102   BUN 12 03/10/2024 1102   BUN 11 02/08/2024 1320   CREATININE 1.03 03/10/2024 1102      Component Value Date/Time   CALCIUM  9.7 03/10/2024 1102   ALKPHOS 88 03/10/2024 1102   AST 29 03/10/2024 1102   ALT 22 03/10/2024 1102   BILITOT  0.7 03/10/2024 1102       RADIOGRAPHIC STUDIES: CARDIAC CATHETERIZATION Result Date: 02/23/2024 1.  Mild to moderate distal left main stenosis of 40% 2.  Patent LAD with mild calcific 40% proximal vessel stenosis 3.  Patent left circumflex with severe calcific 75% ostial stenosis and severe 75% ostial OM1 stenosis 4.  Patent RCA with mild nonobstructive plaquing, moderate ostial PDA stenosis of 50% 5.  Moderate aortic stenosis mean gradient 27 mmHg, calculated aortic valve area 1.4 cm 6.  Normal intracardiac diastolic filling pressures with mean right atrial pressure 5 mmHg, PA pressure 24/8 mean 17 mmHg, wedge pressure 9 mmHg, LVEDP 11 mmHg, cardiac output 7 L/min, cardiac index 3.3 L/min/m Recommendations: Medical therapy for CAD, continue evaluation for TAVR via  Progress-CAP trial    ASSESSMENT AND PLAN: Assessment and Plan Assessment & Plan Thrombocytopenia likely drug-induced Chronic, mild thrombocytopenia most likely secondary to Crestor  and Zetia , with platelet counts fluctuating between 85,000 and 116,000 over the past year. He remains asymptomatic except for mild ecchymosis and occasional bleeding with trauma, which resolves promptly. No evidence of significant hemorrhage or other cytopenias. Large, functionally effective platelets are present. No intervention is indicated as long as platelet count remains above 75,000 and he is asymptomatic. - Reviewed current blood counts; platelets 105,000 with large platelets present. - Reassured regarding the benign nature and stability of thrombocytopenia. - Recommended continued monitoring of platelet counts with primary care; if platelets decrease to 75,000 or below, repeat in one month. - Advised against discontinuing Crestor  and Zetia  due to his importance for lipid management. - Provided anticipatory guidance that platelet count fluctuations are expected and not concerning unless accompanied by symptoms or significant decline. - Advised follow-up as needed; available for consultation if further questions arise.  Prostate cancer in remission Prostate cancer status post robotic prostatectomy over a decade ago, with no evidence of recurrence and undetectable PSA. No adjuvant hormonal therapy. Considered cancer-free by urology. - Reviewed prostate cancer history and confirmed remission. He was advised to call immediately if he has any concerning symptoms in the interval.  The patient voices understanding of current disease status and treatment options and is in agreement with the current care plan.  All questions were answered. The patient knows to call the clinic with any problems, questions or concerns. We can certainly see the patient much sooner if necessary.  Thank you so much for allowing me to  participate in the care of Jeffrey Ewing. I will continue to follow up the patient with you and assist in his care.  The total time spent in the appointment was 60 minutes including review of chart and various tests results, discussions about plan of care and coordination of care plan .   Disclaimer: This note was dictated with voice recognition software. Similar sounding words can inadvertently be transcribed and may not be corrected upon review.   Jeffrey Ewing March 10, 2024, 12:37 PM       [1]  Social History Tobacco Use   Smoking status: Former    Current packs/day: 0.00    Average packs/day: 1 pack/day for 41.0 years (41.0 ttl pk-yrs)    Types: Cigarettes    Start date: 11/05/1962    Quit date: 11/05/2003    Years since quitting: 20.3   Smokeless tobacco: Never  Vaping Use   Vaping status: Never Used  Substance Use Topics   Alcohol use: No   Drug use: No  [2]  Allergies Allergen Reactions   Meloxicam  Nausea Only   Sertraline Nausea Only

## 2024-03-21 ENCOUNTER — Ambulatory Visit

## 2024-03-21 ENCOUNTER — Encounter

## 2024-03-21 VITALS — BP 104/57 | HR 85 | Resp 20 | Ht 71.0 in | Wt 210.0 lb

## 2024-03-21 DIAGNOSIS — I350 Nonrheumatic aortic (valve) stenosis: Secondary | ICD-10-CM

## 2024-03-21 NOTE — H&P (View-Only) (Signed)
 " [facility]   MULTIDISCIPLINARY HEART VALVE CLINIC    Jeffrey Ewing [facility] Medical Record #[id number] Date of Birth: 25-May-1940  Referring: Wonda Sharper, MD Primary Care: Shayne Anes, MD Primary Cardiologist:Arun MARLA Red, MD  Chief Complaint:    Chief Complaint  Patient presents with   Aortic Stenosis    TAVR consult/ review all testing    History of Present Illness:     Jeffrey Ewing is a 83 y.o. male presents for evaluation of moderate aortic stenosis and TAVR as a Progress CAP trial patient.  He has a history of TIA in 2005, HL, PVD and thrombocytopenia.  His main symptoms are shortness of breath and mostly fatigue.  Also has some dizziness but has chronic dysequilibrium.    He lives with his wife in [place] (continuing care facility) in a villa.  He is completely independent in his ADLs and drives.  He is a former smoker and quit 25 years ago.      NO history of chest surgery or radiation.    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 2013   JOINT REPLACEMENT Bilateral    KNEE ARTHROSCOPY  2009   left   MASS EXCISION Right 04/03/2016   Procedure: EXCISION cyst right index finger abd  debridement proximal interphalangeal;  Surgeon: Murrell Kuba, MD;  Location: [facility];  Service: Orthopedics;  Laterality: Right;  FAB   PROSTATECTOMY  2008   removal melanoma  2001   left cheek   REVERSE SHOULDER ARTHROPLASTY Right 03/04/2019   Procedure: REVERSE SHOULDER ARTHROPLASTY;  Surgeon: Kay Kemps, MD;  Location: [facility];  Service: Orthopedics;  Laterality: Right;   RIGHT/LEFT HEART CATH AND CORONARY ANGIOGRAPHY N/A 02/23/2024   Procedure: RIGHT/LEFT HEART CATH AND CORONARY ANGIOGRAPHY;  Surgeon: Wonda Sharper, MD;  Location: [facility] INVASIVE CV LAB;  Service: Cardiovascular;  Laterality: N/A;   TONSILLECTOMY  as child   TOTAL KNEE ARTHROPLASTY Right 12/20/2012   Procedure: RIGHT TOTAL KNEE ARTHROPLASTY;  Surgeon: Dempsey LULLA Moan, MD;  Location: [facility];  Service: Orthopedics;  Laterality: Right;   TOTAL KNEE ARTHROPLASTY Bilateral    VASECTOMY  1978    Social History:  Tobacco Use History[1]  Social History   Substance and Sexual Activity  Alcohol Use No     Allergies[2]    Current Outpatient Medications  Medication Sig Dispense Refill   acetaminophen  (TYLENOL ) 500 MG tablet Take 500 mg by mouth every 8 (eight) hours as needed for mild pain (pain score 1-3), moderate pain (pain score 4-6) or  headache.     aspirin  EC 81 MG tablet Take 1 tablet (81 mg total) by mouth daily. Swallow whole.     CRESTOR  20 MG tablet Take 20 mg by mouth daily.      DULoxetine (CYMBALTA) 60 MG capsule Take 60 mg by mouth daily.     ezetimibe  (ZETIA ) 10 MG tablet TAKE 1 TABLET(10 MG) BY MOUTH DAILY 90 tablet 1   gabapentin (NEURONTIN) 100 MG capsule Take 100 mg by mouth daily as needed (Nerve Pain).     L-Methylfolate-B6-B12 (FOLTANX PO) Take 1 capsule by mouth daily.     metoprolol  succinate (TOPROL  XL) 25 MG 24 hr tablet Take 0.5 tablets (12.5 mg total) by mouth daily. 60 tablet 2   Multiple Vitamin (MULTI VITAMIN) TABS Take 1 tablet by mouth daily.     No current  facility-administered medications for this visit.    (Not in a hospital admission)   Family History  Problem Relation Age of Onset   Alzheimer's disease Mother    Heart attack Father    Hypertension Father    CAD Father    Colon cancer Neg Hx    Stomach cancer Neg Hx    Esophageal cancer Neg Hx    Rectal cancer Neg Hx      Review of Systems:   Review of Systems  Constitutional:  Positive for malaise/fatigue. Negative for weight loss.  Respiratory:  Positive for shortness of breath. Negative for cough.   Cardiovascular:  Negative for chest pain, palpitations and leg swelling.  Gastrointestinal:  Negative for nausea and vomiting.  Neurological:  Positive for dizziness. Negative for headaches.      Physical Exam: BP (!) 104/57   Pulse 85   Resp 20   Ht 5' 11 (1.803 m)   Wt 210 lb (95.3 kg)   SpO2 95%   BMI 29.29 kg/m  Physical Exam Constitutional:      Appearance: Normal appearance.  Cardiovascular:     Rate and Rhythm: Normal rate and regular rhythm.     Heart sounds: Murmur heard.  Pulmonary:     Effort: Pulmonary effort is normal.     Breath sounds: Normal breath sounds.  Abdominal:     General: There is no distension.     Palpations: Abdomen is soft.  Musculoskeletal:        General: No swelling.  Neurological:     General: No focal deficit present.     Mental Status: He is alert and oriented to person, place, and time.       Cardiac Studies & Procedures   ______________________________________________________________________________________________ CARDIAC CATHETERIZATION  CARDIAC CATHETERIZATION 02/23/2024  Conclusion 1.  Mild to moderate distal left main stenosis of 40% 2.  Patent LAD with mild calcific 40% proximal vessel stenosis 3.  Patent left circumflex with severe calcific 75% ostial stenosis and severe 75% ostial OM1 stenosis 4.  Patent RCA with mild nonobstructive plaquing, moderate ostial PDA stenosis of 50% 5.  Moderate aortic  stenosis mean gradient 27 mmHg, calculated aortic valve area 1.4 cm 6.  Normal intracardiac diastolic filling pressures with mean right atrial pressure 5 mmHg, PA pressure 24/8 mean 17 mmHg, wedge pressure 9 mmHg, LVEDP 11 mmHg, cardiac output 7 L/min, cardiac index 3.3 L/min/m  Recommendations: Medical therapy for CAD, continue evaluation for TAVR via Progress-CAP trial  Findings Coronary Findings Diagnostic  Dominance: Right  Left Main Mid LM to Dist LM lesion is 40% stenosed.  Left Anterior Descending Prox LAD to Mid LAD  lesion is 40% stenosed.  Left Circumflex Ost Cx to Prox Cx lesion is 75% stenosed. The lesion is eccentric. The lesion is calcified.  First Obtuse Marginal Branch 1st Mrg lesion is 75% stenosed. The lesion is calcified.  Right Coronary Artery There is mild diffuse disease throughout the vessel.  Right Posterior Descending Artery RPDA lesion is 50% stenosed.  Intervention  No interventions have been documented.     ECHOCARDIOGRAM  ECHOCARDIOGRAM COMPLETE 01/13/2024  Narrative ECHOCARDIOGRAM REPORT    Patient Name:   Jeffrey Ewing Date of Exam: 01/13/2024 Medical Rec #:  [id number]       Height:       70.0 in Accession #:    [id number]      Weight:       207.0 lb Date of Birth:  07/22/1940       BSA:          2.118 m Patient Age:    83 years        BP:           147/78 mmHg Patient Gender: M               HR:           69 bpm. Exam Location:  [address]  Procedure: 2D Echo (Both Spectral and Color Flow Doppler were utilized during procedure).  Indications:    I35.0 Aortic Stenosis  History:        Patient has prior history of Echocardiogram examinations, most recent 07/02/2023. Renal artery stenosis; Risk Factors:HLD.  Sonographer:    Waldo Guadalajara RCS Referring Phys: [id number] ARUN K THUKKANI  IMPRESSIONS   1. Left ventricular ejection fraction, by estimation, is 60 to 65%. The left ventricle has normal function. The left ventricle  has no regional wall motion abnormalities. There is mild left ventricular hypertrophy. Left ventricular diastolic parameters are consistent with Grade I diastolic dysfunction (impaired relaxation). The average left ventricular global longitudinal strain is -16.6 %. The global longitudinal strain is abnormal. 2. Right ventricular systolic function is normal. The right ventricular size is normal. Tricuspid regurgitation signal is inadequate for assessing PA pressure. 3. Left atrial size was mildly dilated. 4. The mitral valve is normal in structure. No evidence of mitral valve regurgitation. No evidence of mitral stenosis. 5. The aortic valve is abnormal. There is moderate calcification of the aortic valve. Aortic valve regurgitation is mild. Moderate aortic valve stenosis. Aortic valve area, by VTI measures 1.12 cm. Aortic valve mean gradient measures 25.0 mmHg. Aortic valve Vmax measures 3.15 m/s. 6. Aortic dilatation noted. There is mild dilatation of the aortic root, measuring 39 mm. 7. The inferior vena cava is normal in size with greater than 50% respiratory variability, suggesting right atrial pressure of 3 mmHg.  Comparison(s): A prior study was performed on 07/02/2023. There was hypokinesis of the basal inferior wall which is not visualized today. There was moderate paradoxical low flow low gradient, today is moderate stenosis in severity. There was not aortic regurgitation noted. The aortic root was dilated at 41 mm.  FINDINGS Left Ventricle: Left ventricular ejection fraction, by estimation, is 60 to 65%. The left ventricle has normal function. The left ventricle has no regional wall motion abnormalities. The average left ventricular global longitudinal strain is -16.6 %. Strain was performed and the global longitudinal strain is abnormal. The left ventricular internal cavity size was normal in size. There is mild left ventricular hypertrophy. Left ventricular diastolic parameters are  consistent with Grade  I diastolic dysfunction (impaired relaxation).  Right Ventricle: The right ventricular size is normal. No increase in right ventricular wall thickness. Right ventricular systolic function is normal. Tricuspid regurgitation signal is inadequate for assessing PA pressure.  Left Atrium: Left atrial size was mildly dilated.  Right Atrium: Right atrial size was normal in size.  Pericardium: There is no evidence of pericardial effusion.  Mitral Valve: The mitral valve is normal in structure. There is mild thickening of the mitral valve leaflet(s). No evidence of mitral valve regurgitation. No evidence of mitral valve stenosis.  Tricuspid Valve: The tricuspid valve is normal in structure. Tricuspid valve regurgitation is not demonstrated. No evidence of tricuspid stenosis.  Aortic Valve: The aortic valve is abnormal. There is moderate calcification of the aortic valve. Aortic valve regurgitation is mild. Aortic regurgitation PHT measures 374 msec. Moderate aortic stenosis is present. Aortic valve mean gradient measures 25.0 mmHg. Aortic valve peak gradient measures 39.7 mmHg. Aortic valve area, by VTI measures 1.12 cm.  Pulmonic Valve: The pulmonic valve was normal in structure. Pulmonic valve regurgitation is trivial. No evidence of pulmonic stenosis.  Aorta: Aortic dilatation noted. There is mild dilatation of the aortic root, measuring 39 mm.  Venous: The inferior vena cava is normal in size with greater than 50% respiratory variability, suggesting right atrial pressure of 3 mmHg.  IAS/Shunts: No atrial level shunt detected by color flow Doppler.   LEFT VENTRICLE PLAX 2D LVIDd:         3.90 cm   Diastology LVIDs:         2.60 cm   LV e' medial:    4.90 cm/s LV PW:         1.40 cm   LV E/e' medial:  10.4 LV IVS:        1.40 cm   LV e' lateral:   4.57 cm/s LVOT diam:     2.20 cm   LV E/e' lateral: 11.1 LV SV:         88 LV SV Index:   42        2D Longitudinal  Strain LVOT Area:     3.80 cm  2D Strain GLS (A4C):   -18.9 % LV IVRT:       102 msec  2D Strain GLS (A3C):   -14.1 % 2D Strain GLS (A2C):   -16.7 % 2D Strain GLS Avg:     -16.6 %  RIGHT VENTRICLE RV Basal diam:  3.80 cm     PULMONARY VEINS RV S prime:     13.50 cm/s  A Reversal Velocity: 28.40 cm/s TAPSE (M-mode): 2.0 cm      Diastolic Velocity:  32.20 cm/s S/D Velocity:        1.50 Systolic Velocity:   49.70 cm/s  LEFT ATRIUM             Index        RIGHT ATRIUM           Index LA diam:        4.10 cm 1.94 cm/m   RA Area:     13.20 cm LA Vol (A2C):   49.3 ml 23.28 ml/m  RA Volume:   30.80 ml  14.54 ml/m LA Vol (A4C):   50.4 ml 23.80 ml/m LA Biplane Vol: 49.8 ml 23.51 ml/m AORTIC VALVE AV Area (Vmax):    0.99 cm AV Area (Vmean):   1.11 cm AV Area (VTI):     1.12 cm AV Vmax:  315.00 cm/s AV Vmean:          234.000 cm/s AV VTI:            0.787 m AV Peak Grad:      39.7 mmHg AV Mean Grad:      25.0 mmHg LVOT Vmax:         82.20 cm/s LVOT Vmean:        68.100 cm/s LVOT VTI:          0.232 m LVOT/AV VTI ratio: 0.29 AI PHT:            374 msec  AORTA Ao Root diam: 3.90 cm Ao Asc diam:  3.40 cm  MITRAL VALVE MV Area (PHT):             SHUNTS MV Decel Time:             Systemic VTI:  0.23 m MV E velocity: 50.80 cm/s  Systemic Diam: 2.20 cm MV A velocity: 99.60 cm/s MV E/A ratio:  0.51  Emeline Calender Electronically signed by Emeline Calender Signature Date/Time: 01/13/2024/3:02:14 PM    Final    MONITORS  LONG TERM MONITOR (3-14 DAYS) 09/09/2023  Narrative Patch Wear Time:  13 days and 23 hours (2025-05-16T14:10:13-0400 to 2025-05-30T14:10:07-398)  Patient had a min HR of 54 bpm, max HR of 231 bpm, and avg HR of 81 bpm. Predominant underlying rhythm was Sinus Rhythm. 42 Supraventricular Tachycardia runs occurred, the run with the fastest interval lasting 14 beats with a max rate of 231 bpm, the longest lasting 11.4 secs with an avg rate of 145 bpm.  Isolated SVEs were occasional (1.1%, 18087), SVE Couplets were rare (<1.0%, 393), and SVE Triplets were rare (<1.0%, 55). Isolated VEs were rare (<1.0%), VE Couplets were rare (<1.0%), and no VE Triplets were present.  Monitor shows 42 runs of PSVT up to 11 seconds. Low burden of isolated PAC's (1.1%). No afib was noted.  Vinie KYM Maxcy, MD, [facility], FNLA, FACP [place]  [facility] Medical Director of the [facility] Lipid [facility] Cardiovascular Risk Reduction Clinic Diplomate of the American Board of Clinical Lipidology Attending Cardiologist Direct Dial: [phone]  Fax: [phone] Website:  [email or web address]   CT SCANS  CT CORONARY MORPH W/CTA COR W/SCORE 02/09/2024  Addendum 02/14/2024 10:23 PM ADDENDUM REPORT: 02/14/2024 22:21  EXAM: OVER-READ INTERPRETATION  CT CHEST  The following report is an over-read performed by radiologist Dr. Suzen Dials of [facility] on 02/14/2024. This over-read does not include interpretation of cardiac or coronary anatomy or pathology. The coronary calcium  score/coronary CTA interpretation by the cardiologist is attached.  COMPARISON:  February 09, 2024  FINDINGS: Cardiovascular: There are no significant extracardiac vascular findings.  Mediastinum/Nodes: There are no enlarged lymph nodes within the visualized mediastinum.  Lungs/Pleura: There is no pleural effusion. There is mild bibasilar linear scarring. Mild, stable peri-fissural pleural thickening is seen along the oblique fissure on the right  Upper abdomen: No significant findings in the visualized upper abdomen.  Musculoskeletal/Chest wall: No chest wall mass or suspicious osseous findings within the visualized chest.  IMPRESSION: No significant extracardiac findings within the visualized chest.   Electronically Signed By: Suzen Dials M.D. On: 02/14/2024 22:21  Narrative CLINICAL DATA:  Aortic Stenosis.  Progress  CAP.  EXAM: Cardiac TAVR CT  TECHNIQUE: A non-contrast, gated CT scan was obtained with axial slices of 2.5 mm through the heart for aortic valve scoring. A 120 kV retrospective, gated, contrast cardiac scan was obtained.  Gantry rotation speed was 230 msec and collimation was 0.63 mm. Nitroglycerin was not given. A delayed scan was obtained to exclude left atrial appendage thrombus. The 3D dataset was reconstructed in systole with motion correction. The 3D data set was reconstructed in 5% intervals of the 0-95% of the R-R cycle. Systolic and diastolic phases were analyzed on a dedicated workstation using MPR, MIP, and VRT modes. The patient received 100 cc of contrast.  FINDINGS: Aortic Valve: Valve Morphology: Tri-leaflet valve with moderate symmetric calcification and planimeter valve area is 1.64 Sq cm  Annular and subannular calcification: None  Aortic Valve Calcium  Score: 1948  Perimembranous septal diameter: 7 mm  Mitral Valve: Mild mitral annular calcification  Aortic Annulus Measurements- Systole-30%  Major annulus diameter: 30 mm  Minor annulus diameter: 26 mm  Annular perimeter: 87 mm  Annular area: 5.86 cm2  LVOT area: 5.89 cm2  Aortic Annulus Measurements- Diastole 75%  Major annulus diameter: 32 mm  Minor annulus diameter:24 mm  Annular perimeter: 87 mm  Annular area: 5.75 cm2  LVOT area: 5.77 cm2  Aortic Measurements- 75% phase  Sinotubular Junction: 31 mm  Ascending Thoracic Aorta: 32 mm  Descending Thoracic Aorta: 25 mm  Aortic atherosclerosis.  Sinus of Valsalva Measurements:  Right coronary cusp width: 38 mm  Left coronary cusp width: 39 mm  Non coronary cusp width: 39 mm  Coronary Artery Height above Annulus:  Left Main: 18 mm  Left SoV height: 24 mm  Right Coronary: 26 mm  Right SoV height: 26 mm  Optimum Fluoroscopic Angle for Delivery: LAO 7, CAU 6  Valves for structural team consideration:  29 mm Sapien  valve  Progress CAP - not planned for a 34 mm Evolut valve, though dimensions are sufficient  Non TAVR Valve Findings:  Coronary Arteries: Normal coronary origin. Study not completed with nitroglycerin. With this caveat:  Mild (25-49%) calcified plaque in the proximal RCA. Cannot fully assess mid RCA and distal RCA in this study.  Cannot exclude proximal LAD or LCX disease.  Mild left main disease.  Systemic veins: Normal anatomy  Main Pulmonary artery: Moderate dilation 31 mm  Pulmonary veins: Normal anatomy  Left atrial appendage: Patent  Interatrial septum: Cannot exclude small PFO  Chamber dimensions: Mild RV dilation.  Pericardium: No pericardial calcification  Extra Cardiac Findings as per separate reporting.  Image quality: Excellent.  Noise artifact is: Limited.  IMPRESSION: 1. Aortic stenosis. Measurements for potential interventions as above.  Stanly Leavens MD  Electronically Signed: By: Stanly Leavens M.D. On: 02/09/2024 16:19     ______________________________________________________________________________________________      ECG NSR    I have independently reviewed the above radiologic studies and discussed with the patient   Recent Lab Findings: Lab Results  Component Value Date   WBC 7.3 03/10/2024   HGB 13.9 03/10/2024   HCT 40.2 03/10/2024   PLT 105 (L) 03/10/2024   GLUCOSE 112 (H) 03/10/2024   CHOL 133 01/20/2024   TRIG 121 01/20/2024   HDL 57 01/20/2024   LDLCALC 55 01/20/2024   ALT 22 03/10/2024   AST 29 03/10/2024   NA 137 03/10/2024   K 4.5 03/10/2024   CL 99 03/10/2024   CREATININE 1.03 03/10/2024   BUN 12 03/10/2024   CO2 28 03/10/2024   TSH 2.030 06/05/2021   INR 0.99 12/14/2012      Assessment / Plan:   83 y.o. male with moderate aortic stenosis, he is a Progress CAP trial participant with symptoms  of fatigue, dyspnea and dizziness.  STS score: 2.95%.  NYHA Class II.  The risks and benefits of  transfemoral TAVR were discussed in detail.  The risks included death, stroke, paravalvular leak, aortic dissection, annulus rupture, device embolization, acute myocardial infarction, arrhythmia, heart block or need for permanent pacemaker.  We also discussed possibility of an emergent sternotomy to address any procedural complications.  Based on our discussion, we collectively decided that an emergent sternotomy would be indicated.  The patient is agreeable to proceed.  Based on my review of his LHC, echo, and CTA, I agree with the multidisciplinary plan to proceed with a TF 29 mm S3 TAVR.   I  spent 30 minutes counseling the patient face to face.   Con RAMAN Destinie Thornsberry 03/21/2024 1:01 PM      [1]  Social History Tobacco Use  Smoking Status Former   Current packs/day: 0.00   Average packs/day: 1 pack/day for 41.0 years (41.0 ttl pk-yrs)   Types: Cigarettes   Start date: 11/05/1962   Quit date: 11/05/2003   Years since quitting: 20.3  Smokeless Tobacco Never  [2]  Allergies Allergen Reactions   Meloxicam Nausea Only   Sertraline Nausea Only   "

## 2024-03-21 NOTE — Progress Notes (Signed)
 " [facility]   MULTIDISCIPLINARY HEART VALVE CLINIC    Jeffrey Ewing [facility] Medical Record #[id number] Date of Birth: 25-May-1940  Referring: Jeffrey Sharper, MD Primary Care: Jeffrey Anes, MD Primary Cardiologist:Jeffrey MARLA Red, MD  Chief Complaint:    Chief Complaint  Patient presents with   Aortic Stenosis    TAVR consult/ review all testing    History of Present Illness:     Jeffrey Ewing is a 83 y.o. male presents for evaluation of moderate aortic stenosis and TAVR as a Progress CAP trial patient.  He has a history of TIA in 2005, HL, PVD and thrombocytopenia.  His main symptoms are shortness of breath and mostly fatigue.  Also has some dizziness but has chronic dysequilibrium.    He lives with his wife in [place] (continuing care facility) in a villa.  He is completely independent in his ADLs and drives.  He is a former smoker and quit 25 years ago.      NO history of chest surgery or radiation.    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 2013   JOINT REPLACEMENT Bilateral    KNEE ARTHROSCOPY  2009   left   MASS EXCISION Right 04/03/2016   Procedure: EXCISION cyst right index finger abd  debridement proximal interphalangeal;  Surgeon: Murrell Kuba, MD;  Location: [facility];  Service: Orthopedics;  Laterality: Right;  FAB   PROSTATECTOMY  2008   removal melanoma  2001   left cheek   REVERSE SHOULDER ARTHROPLASTY Right 03/04/2019   Procedure: REVERSE SHOULDER ARTHROPLASTY;  Surgeon: Kay Kemps, MD;  Location: [facility];  Service: Orthopedics;  Laterality: Right;   RIGHT/LEFT HEART CATH AND CORONARY ANGIOGRAPHY N/A 02/23/2024   Procedure: RIGHT/LEFT HEART CATH AND CORONARY ANGIOGRAPHY;  Surgeon: Jeffrey Sharper, MD;  Location: [facility] INVASIVE CV LAB;  Service: Cardiovascular;  Laterality: N/A;   TONSILLECTOMY  as child   TOTAL KNEE ARTHROPLASTY Right 12/20/2012   Procedure: RIGHT TOTAL KNEE ARTHROPLASTY;  Surgeon: Dempsey LULLA Moan, MD;  Location: [facility];  Service: Orthopedics;  Laterality: Right;   TOTAL KNEE ARTHROPLASTY Bilateral    VASECTOMY  1978    Social History:  Tobacco Use History[1]  Social History   Substance and Sexual Activity  Alcohol Use No     Allergies[2]    Current Outpatient Medications  Medication Sig Dispense Refill   acetaminophen  (TYLENOL ) 500 MG tablet Take 500 mg by mouth every 8 (eight) hours as needed for mild pain (pain score 1-3), moderate pain (pain score 4-6) or  headache.     aspirin  EC 81 MG tablet Take 1 tablet (81 mg total) by mouth daily. Swallow whole.     CRESTOR  20 MG tablet Take 20 mg by mouth daily.      DULoxetine (CYMBALTA) 60 MG capsule Take 60 mg by mouth daily.     ezetimibe  (ZETIA ) 10 MG tablet TAKE 1 TABLET(10 MG) BY MOUTH DAILY 90 tablet 1   gabapentin (NEURONTIN) 100 MG capsule Take 100 mg by mouth daily as needed (Nerve Pain).     L-Methylfolate-B6-B12 (FOLTANX PO) Take 1 capsule by mouth daily.     metoprolol  succinate (TOPROL  XL) 25 MG 24 hr tablet Take 0.5 tablets (12.5 mg total) by mouth daily. 60 tablet 2   Multiple Vitamin (MULTI VITAMIN) TABS Take 1 tablet by mouth daily.     No current  facility-administered medications for this visit.    (Not in a hospital admission)   Family History  Problem Relation Age of Onset   Alzheimer's disease Mother    Heart attack Father    Hypertension Father    CAD Father    Colon cancer Neg Hx    Stomach cancer Neg Hx    Esophageal cancer Neg Hx    Rectal cancer Neg Hx      Review of Systems:   Review of Systems  Constitutional:  Positive for malaise/fatigue. Negative for weight loss.  Respiratory:  Positive for shortness of breath. Negative for cough.   Cardiovascular:  Negative for chest pain, palpitations and leg swelling.  Gastrointestinal:  Negative for nausea and vomiting.  Neurological:  Positive for dizziness. Negative for headaches.      Physical Exam: BP (!) 104/57   Pulse 85   Resp 20   Ht 5' 11 (1.803 m)   Wt 210 lb (95.3 kg)   SpO2 95%   BMI 29.29 kg/m  Physical Exam Constitutional:      Appearance: Normal appearance.  Cardiovascular:     Rate and Rhythm: Normal rate and regular rhythm.     Heart sounds: Murmur heard.  Pulmonary:     Effort: Pulmonary effort is normal.     Breath sounds: Normal breath sounds.  Abdominal:     General: There is no distension.     Palpations: Abdomen is soft.  Musculoskeletal:        General: No swelling.  Neurological:     General: No focal deficit present.     Mental Status: He is alert and oriented to person, place, and time.       Cardiac Studies & Procedures   ______________________________________________________________________________________________ CARDIAC CATHETERIZATION  CARDIAC CATHETERIZATION 02/23/2024  Conclusion 1.  Mild to moderate distal left main stenosis of 40% 2.  Patent LAD with mild calcific 40% proximal vessel stenosis 3.  Patent left circumflex with severe calcific 75% ostial stenosis and severe 75% ostial OM1 stenosis 4.  Patent RCA with mild nonobstructive plaquing, moderate ostial PDA stenosis of 50% 5.  Moderate aortic  stenosis mean gradient 27 mmHg, calculated aortic valve area 1.4 cm 6.  Normal intracardiac diastolic filling pressures with mean right atrial pressure 5 mmHg, PA pressure 24/8 mean 17 mmHg, wedge pressure 9 mmHg, LVEDP 11 mmHg, cardiac output 7 L/min, cardiac index 3.3 L/min/m  Recommendations: Medical therapy for CAD, continue evaluation for TAVR via Progress-CAP trial  Findings Coronary Findings Diagnostic  Dominance: Right  Left Main Mid LM to Dist LM lesion is 40% stenosed.  Left Anterior Descending Prox LAD to Mid LAD  lesion is 40% stenosed.  Left Circumflex Ost Cx to Prox Cx lesion is 75% stenosed. The lesion is eccentric. The lesion is calcified.  First Obtuse Marginal Branch 1st Mrg lesion is 75% stenosed. The lesion is calcified.  Right Coronary Artery There is mild diffuse disease throughout the vessel.  Right Posterior Descending Artery RPDA lesion is 50% stenosed.  Intervention  No interventions have been documented.     ECHOCARDIOGRAM  ECHOCARDIOGRAM COMPLETE 01/13/2024  Narrative ECHOCARDIOGRAM REPORT    Patient Name:   Jeffrey Ewing Date of Exam: 01/13/2024 Medical Rec #:  [id number]       Height:       70.0 in Accession #:    [id number]      Weight:       207.0 lb Date of Birth:  07/22/1940       BSA:          2.118 m Patient Age:    83 years        BP:           147/78 mmHg Patient Gender: M               HR:           69 bpm. Exam Location:  [address]  Procedure: 2D Echo (Both Spectral and Color Flow Doppler were utilized during procedure).  Indications:    I35.0 Aortic Stenosis  History:        Patient has prior history of Echocardiogram examinations, most recent 07/02/2023. Renal artery stenosis; Risk Factors:HLD.  Sonographer:    Jeffrey Ewing RCS Referring Phys: [id number] Jeffrey K THUKKANI  IMPRESSIONS   1. Left ventricular ejection fraction, by estimation, is 60 to 65%. The left ventricle has normal function. The left ventricle  has no regional wall motion abnormalities. There is mild left ventricular hypertrophy. Left ventricular diastolic parameters are consistent with Grade I diastolic dysfunction (impaired relaxation). The average left ventricular global longitudinal strain is -16.6 %. The global longitudinal strain is abnormal. 2. Right ventricular systolic function is normal. The right ventricular size is normal. Tricuspid regurgitation signal is inadequate for assessing PA pressure. 3. Left atrial size was mildly dilated. 4. The mitral valve is normal in structure. No evidence of mitral valve regurgitation. No evidence of mitral stenosis. 5. The aortic valve is abnormal. There is moderate calcification of the aortic valve. Aortic valve regurgitation is mild. Moderate aortic valve stenosis. Aortic valve area, by VTI measures 1.12 cm. Aortic valve mean gradient measures 25.0 mmHg. Aortic valve Vmax measures 3.15 m/s. 6. Aortic dilatation noted. There is mild dilatation of the aortic root, measuring 39 mm. 7. The inferior vena cava is normal in size with greater than 50% respiratory variability, suggesting right atrial pressure of 3 mmHg.  Comparison(s): A prior study was performed on 07/02/2023. There was hypokinesis of the basal inferior wall which is not visualized today. There was moderate paradoxical low flow low gradient, today is moderate stenosis in severity. There was not aortic regurgitation noted. The aortic root was dilated at 41 mm.  FINDINGS Left Ventricle: Left ventricular ejection fraction, by estimation, is 60 to 65%. The left ventricle has normal function. The left ventricle has no regional wall motion abnormalities. The average left ventricular global longitudinal strain is -16.6 %. Strain was performed and the global longitudinal strain is abnormal. The left ventricular internal cavity size was normal in size. There is mild left ventricular hypertrophy. Left ventricular diastolic parameters are  consistent with Grade  I diastolic dysfunction (impaired relaxation).  Right Ventricle: The right ventricular size is normal. No increase in right ventricular wall thickness. Right ventricular systolic function is normal. Tricuspid regurgitation signal is inadequate for assessing PA pressure.  Left Atrium: Left atrial size was mildly dilated.  Right Atrium: Right atrial size was normal in size.  Pericardium: There is no evidence of pericardial effusion.  Mitral Valve: The mitral valve is normal in structure. There is mild thickening of the mitral valve leaflet(s). No evidence of mitral valve regurgitation. No evidence of mitral valve stenosis.  Tricuspid Valve: The tricuspid valve is normal in structure. Tricuspid valve regurgitation is not demonstrated. No evidence of tricuspid stenosis.  Aortic Valve: The aortic valve is abnormal. There is moderate calcification of the aortic valve. Aortic valve regurgitation is mild. Aortic regurgitation PHT measures 374 msec. Moderate aortic stenosis is present. Aortic valve mean gradient measures 25.0 mmHg. Aortic valve peak gradient measures 39.7 mmHg. Aortic valve area, by VTI measures 1.12 cm.  Pulmonic Valve: The pulmonic valve was normal in structure. Pulmonic valve regurgitation is trivial. No evidence of pulmonic stenosis.  Aorta: Aortic dilatation noted. There is mild dilatation of the aortic root, measuring 39 mm.  Venous: The inferior vena cava is normal in size with greater than 50% respiratory variability, suggesting right atrial pressure of 3 mmHg.  IAS/Shunts: No atrial level shunt detected by color flow Doppler.   LEFT VENTRICLE PLAX 2D LVIDd:         3.90 cm   Diastology LVIDs:         2.60 cm   LV e' medial:    4.90 cm/s LV PW:         1.40 cm   LV E/e' medial:  10.4 LV IVS:        1.40 cm   LV e' lateral:   4.57 cm/s LVOT diam:     2.20 cm   LV E/e' lateral: 11.1 LV SV:         88 LV SV Index:   42        2D Longitudinal  Strain LVOT Area:     3.80 cm  2D Strain GLS (A4C):   -18.9 % LV IVRT:       102 msec  2D Strain GLS (A3C):   -14.1 % 2D Strain GLS (A2C):   -16.7 % 2D Strain GLS Avg:     -16.6 %  RIGHT VENTRICLE RV Basal diam:  3.80 cm     PULMONARY VEINS RV S prime:     13.50 cm/s  A Reversal Velocity: 28.40 cm/s TAPSE (M-mode): 2.0 cm      Diastolic Velocity:  32.20 cm/s S/D Velocity:        1.50 Systolic Velocity:   49.70 cm/s  LEFT ATRIUM             Index        RIGHT ATRIUM           Index LA diam:        4.10 cm 1.94 cm/m   RA Area:     13.20 cm LA Vol (A2C):   49.3 ml 23.28 ml/m  RA Volume:   30.80 ml  14.54 ml/m LA Vol (A4C):   50.4 ml 23.80 ml/m LA Biplane Vol: 49.8 ml 23.51 ml/m AORTIC VALVE AV Area (Vmax):    0.99 cm AV Area (Vmean):   1.11 cm AV Area (VTI):     1.12 cm AV Vmax:  315.00 cm/s AV Vmean:          234.000 cm/s AV VTI:            0.787 m AV Peak Grad:      39.7 mmHg AV Mean Grad:      25.0 mmHg LVOT Vmax:         82.20 cm/s LVOT Vmean:        68.100 cm/s LVOT VTI:          0.232 m LVOT/AV VTI ratio: 0.29 AI PHT:            374 msec  AORTA Ao Root diam: 3.90 cm Ao Asc diam:  3.40 cm  MITRAL VALVE MV Area (PHT):             SHUNTS MV Decel Time:             Systemic VTI:  0.23 m MV E velocity: 50.80 cm/s  Systemic Diam: 2.20 cm MV A velocity: 99.60 cm/s MV E/A ratio:  0.51  Jeffrey Ewing Electronically signed by Jeffrey Ewing Signature Date/Time: 01/13/2024/3:02:14 PM    Final    MONITORS  LONG TERM MONITOR (3-14 DAYS) 09/09/2023  Narrative Patch Wear Time:  13 days and 23 hours (2025-05-16T14:10:13-0400 to 2025-05-30T14:10:07-398)  Patient had a min HR of 54 bpm, max HR of 231 bpm, and avg HR of 81 bpm. Predominant underlying rhythm was Sinus Rhythm. 42 Supraventricular Tachycardia runs occurred, the run with the fastest interval lasting 14 beats with a max rate of 231 bpm, the longest lasting 11.4 secs with an avg rate of 145 bpm.  Isolated SVEs were occasional (1.1%, 18087), SVE Couplets were rare (<1.0%, 393), and SVE Triplets were rare (<1.0%, 55). Isolated VEs were rare (<1.0%), VE Couplets were rare (<1.0%), and no VE Triplets were present.  Monitor shows 42 runs of PSVT up to 11 seconds. Low burden of isolated PAC's (1.1%). No afib was noted.  Jeffrey KYM Maxcy, MD, [facility], FNLA, FACP [place]  [facility] Medical Director of the [facility] Cardiovascular Risk Reduction [facility] Diplomate of the American Board of Clinical Lipidology Attending Cardiologist Direct Dial: [phone]  Fax: [phone] Website:  [email or web address]   CT SCANS  CT CORONARY MORPH W/CTA COR W/SCORE 02/09/2024  Addendum 02/14/2024 10:23 PM ADDENDUM REPORT: 02/14/2024 22:21  EXAM: OVER-READ INTERPRETATION  CT CHEST  The following report is an over-read performed by radiologist Dr. Suzen Dials of [facility] on 02/14/2024. This over-read does not include interpretation of cardiac or coronary anatomy or pathology. The coronary calcium  score/coronary CTA interpretation by the cardiologist is attached.  COMPARISON:  February 09, 2024  FINDINGS: Cardiovascular: There are no significant extracardiac vascular findings.  Mediastinum/Nodes: There are no enlarged lymph nodes within the visualized mediastinum.  Lungs/Pleura: There is no pleural effusion. There is mild bibasilar linear scarring. Mild, stable peri-fissural pleural thickening is seen along the oblique fissure on the right  Upper abdomen: No significant findings in the visualized upper abdomen.  Musculoskeletal/Chest wall: No chest wall mass or suspicious osseous findings within the visualized chest.  IMPRESSION: No significant extracardiac findings within the visualized chest.   Electronically Signed By: Suzen Dials M.D. On: 02/14/2024 22:21  Narrative CLINICAL DATA:  Aortic Stenosis.  Progress  CAP.  EXAM: Cardiac TAVR CT  TECHNIQUE: A non-contrast, gated CT scan was obtained with axial slices of 2.5 mm through the heart for aortic valve scoring. A 120 kV retrospective, gated, contrast cardiac scan was obtained.  Gantry rotation speed was 230 msec and collimation was 0.63 mm. Nitroglycerin was not given. A delayed scan was obtained to exclude left atrial appendage thrombus. The 3D dataset was reconstructed in systole with motion correction. The 3D data set was reconstructed in 5% intervals of the 0-95% of the R-R cycle. Systolic and diastolic phases were analyzed on a dedicated workstation using MPR, MIP, and VRT modes. The patient received 100 cc of contrast.  FINDINGS: Aortic Valve: Valve Morphology: Tri-leaflet valve with moderate symmetric calcification and planimeter valve area is 1.64 Sq cm  Annular and subannular calcification: None  Aortic Valve Calcium  Score: 1948  Perimembranous septal diameter: 7 mm  Mitral Valve: Mild mitral annular calcification  Aortic Annulus Measurements- Systole-30%  Major annulus diameter: 30 mm  Minor annulus diameter: 26 mm  Annular perimeter: 87 mm  Annular area: 5.86 cm2  LVOT area: 5.89 cm2  Aortic Annulus Measurements- Diastole 75%  Major annulus diameter: 32 mm  Minor annulus diameter:24 mm  Annular perimeter: 87 mm  Annular area: 5.75 cm2  LVOT area: 5.77 cm2  Aortic Measurements- 75% phase  Sinotubular Junction: 31 mm  Ascending Thoracic Aorta: 32 mm  Descending Thoracic Aorta: 25 mm  Aortic atherosclerosis.  Sinus of Valsalva Measurements:  Right coronary cusp width: 38 mm  Left coronary cusp width: 39 mm  Non coronary cusp width: 39 mm  Coronary Artery Height above Annulus:  Left Main: 18 mm  Left SoV height: 24 mm  Right Coronary: 26 mm  Right SoV height: 26 mm  Optimum Fluoroscopic Angle for Delivery: LAO 7, CAU 6  Valves for structural team consideration:  29 mm Sapien  valve  Progress CAP - not planned for a 34 mm Evolut valve, though dimensions are sufficient  Non TAVR Valve Findings:  Coronary Arteries: Normal coronary origin. Study not completed with nitroglycerin. With this caveat:  Mild (25-49%) calcified plaque in the proximal RCA. Cannot fully assess mid RCA and distal RCA in this study.  Cannot exclude proximal LAD or LCX disease.  Mild left main disease.  Systemic veins: Normal anatomy  Main Pulmonary artery: Moderate dilation 31 mm  Pulmonary veins: Normal anatomy  Left atrial appendage: Patent  Interatrial septum: Cannot exclude small PFO  Chamber dimensions: Mild RV dilation.  Pericardium: No pericardial calcification  Extra Cardiac Findings as per separate reporting.  Image quality: Excellent.  Noise artifact is: Limited.  IMPRESSION: 1. Aortic stenosis. Measurements for potential interventions as above.  Jeffrey Leavens MD  Electronically Signed: By: Jeffrey Ewing M.D. On: 02/09/2024 16:19     ______________________________________________________________________________________________      ECG NSR    I have independently reviewed the above radiologic studies and discussed with the patient   Recent Lab Findings: Lab Results  Component Value Date   WBC 7.3 03/10/2024   HGB 13.9 03/10/2024   HCT 40.2 03/10/2024   PLT 105 (L) 03/10/2024   GLUCOSE 112 (H) 03/10/2024   CHOL 133 01/20/2024   TRIG 121 01/20/2024   HDL 57 01/20/2024   LDLCALC 55 01/20/2024   ALT 22 03/10/2024   AST 29 03/10/2024   NA 137 03/10/2024   K 4.5 03/10/2024   CL 99 03/10/2024   CREATININE 1.03 03/10/2024   BUN 12 03/10/2024   CO2 28 03/10/2024   TSH 2.030 06/05/2021   INR 0.99 12/14/2012      Assessment / Plan:   83 y.o. male with moderate aortic stenosis, he is a Progress CAP trial participant with symptoms  of fatigue, dyspnea and dizziness.  STS score: 2.95%.  NYHA Class II.  The risks and benefits of  transfemoral TAVR were discussed in detail.  The risks included death, stroke, paravalvular leak, aortic dissection, annulus rupture, device embolization, acute myocardial infarction, arrhythmia, heart block or need for permanent pacemaker.  We also discussed possibility of an emergent sternotomy to address any procedural complications.  Based on our discussion, we collectively decided that an emergent sternotomy would be indicated.  The patient is agreeable to proceed.  Based on my review of his LHC, echo, and CTA, I agree with the multidisciplinary plan to proceed with a TF 29 mm S3 TAVR.   I  spent 30 minutes counseling the patient face to face.   Con RAMAN Destinie Thornsberry 03/21/2024 1:01 PM      [1]  Social History Tobacco Use  Smoking Status Former   Current packs/day: 0.00   Average packs/day: 1 pack/day for 41.0 years (41.0 ttl pk-yrs)   Types: Cigarettes   Start date: 11/05/1962   Quit date: 11/05/2003   Years since quitting: 20.3  Smokeless Tobacco Never  [2]  Allergies Allergen Reactions   Meloxicam Nausea Only   Sertraline Nausea Only   "

## 2024-03-29 ENCOUNTER — Other Ambulatory Visit: Payer: Self-pay

## 2024-03-29 DIAGNOSIS — I350 Nonrheumatic aortic (valve) stenosis: Secondary | ICD-10-CM

## 2024-04-08 ENCOUNTER — Other Ambulatory Visit: Payer: Self-pay

## 2024-04-08 ENCOUNTER — Ambulatory Visit: Payer: Self-pay

## 2024-04-08 ENCOUNTER — Encounter (HOSPITAL_COMMUNITY)
Admission: RE | Admit: 2024-04-08 | Discharge: 2024-04-08 | Disposition: A | Source: Ambulatory Visit | Attending: Cardiovascular Disease

## 2024-04-08 ENCOUNTER — Ambulatory Visit (HOSPITAL_COMMUNITY)
Admission: RE | Admit: 2024-04-08 | Discharge: 2024-04-08 | Disposition: A | Source: Ambulatory Visit | Attending: Cardiovascular Disease | Admitting: Cardiovascular Disease

## 2024-04-08 DIAGNOSIS — I350 Nonrheumatic aortic (valve) stenosis: Secondary | ICD-10-CM

## 2024-04-08 DIAGNOSIS — Z01818 Encounter for other preprocedural examination: Secondary | ICD-10-CM

## 2024-04-08 DIAGNOSIS — R9431 Abnormal electrocardiogram [ECG] [EKG]: Secondary | ICD-10-CM | POA: Insufficient documentation

## 2024-04-08 LAB — URINALYSIS, ROUTINE W REFLEX MICROSCOPIC
Bilirubin Urine: NEGATIVE
Glucose, UA: NEGATIVE mg/dL
Ketones, ur: NEGATIVE mg/dL
Leukocytes,Ua: NEGATIVE
Nitrite: NEGATIVE
Protein, ur: NEGATIVE mg/dL
Specific Gravity, Urine: 1.02 (ref 1.005–1.030)
pH: 6.5 (ref 5.0–8.0)

## 2024-04-08 LAB — PROTIME-INR
INR: 1 (ref 0.8–1.2)
Prothrombin Time: 13.6 s (ref 11.4–15.2)

## 2024-04-08 LAB — COMPREHENSIVE METABOLIC PANEL WITH GFR
ALT: 25 U/L (ref 0–44)
AST: 29 U/L (ref 15–41)
Albumin: 4.4 g/dL (ref 3.5–5.0)
Alkaline Phosphatase: 86 U/L (ref 38–126)
Anion gap: 10 (ref 5–15)
BUN: 11 mg/dL (ref 8–23)
CO2: 28 mmol/L (ref 22–32)
Calcium: 9.5 mg/dL (ref 8.9–10.3)
Chloride: 98 mmol/L (ref 98–111)
Creatinine, Ser: 0.93 mg/dL (ref 0.61–1.24)
GFR, Estimated: >60 mL/min
Glucose, Bld: 94 mg/dL (ref 70–99)
Potassium: 4.2 mmol/L (ref 3.5–5.1)
Sodium: 135 mmol/L (ref 135–145)
Total Bilirubin: 0.7 mg/dL (ref 0.0–1.2)
Total Protein: 6.6 g/dL (ref 6.5–8.1)

## 2024-04-08 LAB — CBC
HCT: 42.5 % (ref 39.0–52.0)
Hemoglobin: 14.6 g/dL (ref 13.0–17.0)
MCH: 35.6 pg — ABNORMAL HIGH (ref 26.0–34.0)
MCHC: 34.4 g/dL (ref 30.0–36.0)
MCV: 103.7 fL — ABNORMAL HIGH (ref 80.0–100.0)
Platelets: 101 K/uL — ABNORMAL LOW (ref 150–400)
RBC: 4.1 MIL/uL — ABNORMAL LOW (ref 4.22–5.81)
RDW: 13.5 % (ref 11.5–15.5)
WBC: 8.5 K/uL (ref 4.0–10.5)
nRBC: 0 % (ref 0.0–0.2)

## 2024-04-08 LAB — SURGICAL PCR SCREEN
MRSA, PCR: NEGATIVE
Staphylococcus aureus: NEGATIVE

## 2024-04-08 LAB — URINALYSIS, MICROSCOPIC (REFLEX)

## 2024-04-08 LAB — TYPE AND SCREEN
ABO/RH(D): O POS
Antibody Screen: NEGATIVE

## 2024-04-08 NOTE — Progress Notes (Signed)
 All consents signed by patient at PAT lab appointment. Pt was sent home with printed copy of surgical instructions and CHG soap/CHG soap instructions. All instructions reviewed with patient and questions answered.  Patients chart send to anesthesia for review. Pt denies any respiratory illness/infection in the last two months.

## 2024-04-11 MED ORDER — CEFAZOLIN SODIUM-DEXTROSE 2-4 GM/100ML-% IV SOLN
2.0000 g | INTRAVENOUS | Status: AC
  Administered 2024-04-12: 2 g via INTRAVENOUS
  Filled 2024-04-11 (×2): qty 100

## 2024-04-11 MED ORDER — HEPARIN 30,000 UNITS/1000 ML (OHS) CELLSAVER SOLUTION
Status: DC
  Filled 2024-04-11: qty 1000

## 2024-04-11 MED ORDER — DEXMEDETOMIDINE HCL IN NACL 400 MCG/100ML IV SOLN
0.1000 ug/kg/h | INTRAVENOUS | Status: AC
  Administered 2024-04-12: .7 ug/kg/h via INTRAVENOUS
  Administered 2024-04-12: 95.32 ug via INTRAVENOUS
  Filled 2024-04-11: qty 100

## 2024-04-11 MED ORDER — NOREPINEPHRINE 4 MG/250ML-% IV SOLN
0.0000 ug/min | INTRAVENOUS | Status: AC
  Administered 2024-04-12: 2 ug/min via INTRAVENOUS
  Filled 2024-04-11: qty 250

## 2024-04-11 MED ORDER — MAGNESIUM SULFATE 50 % IJ SOLN
40.0000 meq | INTRAMUSCULAR | Status: DC
  Filled 2024-04-11: qty 9.85

## 2024-04-11 MED ORDER — POTASSIUM CHLORIDE 2 MEQ/ML IV SOLN
80.0000 meq | INTRAVENOUS | Status: DC
  Filled 2024-04-11: qty 40

## 2024-04-12 ENCOUNTER — Other Ambulatory Visit: Payer: Self-pay

## 2024-04-12 ENCOUNTER — Inpatient Hospital Stay (HOSPITAL_COMMUNITY)
Admission: RE | Admit: 2024-04-12 | Discharge: 2024-04-13 | DRG: 267 | Disposition: A | Discharge status: Home / Self Care | Attending: Cardiovascular Disease | Admitting: Cardiovascular Disease

## 2024-04-12 ENCOUNTER — Encounter (HOSPITAL_COMMUNITY): Admitting: Physician Assistant

## 2024-04-12 ENCOUNTER — Inpatient Hospital Stay (HOSPITAL_COMMUNITY)

## 2024-04-12 ENCOUNTER — Encounter (HOSPITAL_COMMUNITY): Admission: RE | Disposition: A | Payer: Self-pay | Source: Home / Self Care | Attending: Cardiovascular Disease

## 2024-04-12 ENCOUNTER — Encounter (HOSPITAL_COMMUNITY): Payer: Self-pay | Admitting: Cardiovascular Disease

## 2024-04-12 ENCOUNTER — Inpatient Hospital Stay (HOSPITAL_COMMUNITY): Admitting: Certified Registered Nurse Anesthetist

## 2024-04-12 DIAGNOSIS — I2510 Atherosclerotic heart disease of native coronary artery without angina pectoris: Secondary | ICD-10-CM | POA: Diagnosis present

## 2024-04-12 DIAGNOSIS — I701 Atherosclerosis of renal artery: Secondary | ICD-10-CM | POA: Diagnosis present

## 2024-04-12 DIAGNOSIS — Z952 Presence of prosthetic heart valve: Principal | ICD-10-CM

## 2024-04-12 DIAGNOSIS — Z79899 Other long term (current) drug therapy: Secondary | ICD-10-CM | POA: Diagnosis not present

## 2024-04-12 DIAGNOSIS — Z0000 Encounter for general adult medical examination without abnormal findings: Secondary | ICD-10-CM

## 2024-04-12 DIAGNOSIS — Z9079 Acquired absence of other genital organ(s): Secondary | ICD-10-CM

## 2024-04-12 DIAGNOSIS — Z87891 Personal history of nicotine dependence: Secondary | ICD-10-CM | POA: Diagnosis not present

## 2024-04-12 DIAGNOSIS — Z96653 Presence of artificial knee joint, bilateral: Secondary | ICD-10-CM | POA: Diagnosis present

## 2024-04-12 DIAGNOSIS — M19011 Primary osteoarthritis, right shoulder: Secondary | ICD-10-CM | POA: Diagnosis present

## 2024-04-12 DIAGNOSIS — Z8673 Personal history of transient ischemic attack (TIA), and cerebral infarction without residual deficits: Secondary | ICD-10-CM

## 2024-04-12 DIAGNOSIS — Z7982 Long term (current) use of aspirin: Secondary | ICD-10-CM | POA: Diagnosis not present

## 2024-04-12 DIAGNOSIS — Z8249 Family history of ischemic heart disease and other diseases of the circulatory system: Secondary | ICD-10-CM | POA: Diagnosis not present

## 2024-04-12 DIAGNOSIS — Z8546 Personal history of malignant neoplasm of prostate: Secondary | ICD-10-CM

## 2024-04-12 DIAGNOSIS — Z006 Encounter for examination for normal comparison and control in clinical research program: Secondary | ICD-10-CM | POA: Diagnosis not present

## 2024-04-12 DIAGNOSIS — C61 Malignant neoplasm of prostate: Secondary | ICD-10-CM | POA: Diagnosis present

## 2024-04-12 DIAGNOSIS — E785 Hyperlipidemia, unspecified: Secondary | ICD-10-CM | POA: Diagnosis present

## 2024-04-12 DIAGNOSIS — D696 Thrombocytopenia, unspecified: Secondary | ICD-10-CM | POA: Diagnosis present

## 2024-04-12 DIAGNOSIS — Z85820 Personal history of malignant melanoma of skin: Secondary | ICD-10-CM | POA: Diagnosis not present

## 2024-04-12 DIAGNOSIS — Z96611 Presence of right artificial shoulder joint: Secondary | ICD-10-CM | POA: Diagnosis present

## 2024-04-12 DIAGNOSIS — M19012 Primary osteoarthritis, left shoulder: Secondary | ICD-10-CM | POA: Diagnosis present

## 2024-04-12 DIAGNOSIS — K869 Disease of pancreas, unspecified: Secondary | ICD-10-CM | POA: Diagnosis present

## 2024-04-12 DIAGNOSIS — I350 Nonrheumatic aortic (valve) stenosis: Principal | ICD-10-CM | POA: Diagnosis present

## 2024-04-12 DIAGNOSIS — Z820 Family history of epilepsy and other diseases of the nervous system: Secondary | ICD-10-CM | POA: Diagnosis not present

## 2024-04-12 DIAGNOSIS — I739 Peripheral vascular disease, unspecified: Secondary | ICD-10-CM | POA: Diagnosis present

## 2024-04-12 DIAGNOSIS — C4330 Malignant melanoma of unspecified part of face: Secondary | ICD-10-CM | POA: Diagnosis present

## 2024-04-12 HISTORY — PX: INTRAOPERATIVE TRANSTHORACIC ECHOCARDIOGRAM: SHX6523

## 2024-04-12 HISTORY — DX: Nonrheumatic aortic (valve) stenosis: I35.0

## 2024-04-12 LAB — POCT I-STAT, CHEM 8
BUN: 13 mg/dL (ref 8–23)
Calcium, Ion: 1.1 mmol/L — ABNORMAL LOW (ref 1.15–1.40)
Chloride: 102 mmol/L (ref 98–111)
Creatinine, Ser: 0.8 mg/dL (ref 0.61–1.24)
Glucose, Bld: 150 mg/dL — ABNORMAL HIGH (ref 70–99)
HCT: 36 % — ABNORMAL LOW (ref 39.0–52.0)
Hemoglobin: 12.2 g/dL — ABNORMAL LOW (ref 13.0–17.0)
Potassium: 4.1 mmol/L (ref 3.5–5.1)
Sodium: 136 mmol/L (ref 135–145)
TCO2: 21 mmol/L — ABNORMAL LOW (ref 22–32)

## 2024-04-12 LAB — ECHOCARDIOGRAM LIMITED
AR max vel: 1.55 cm2
AV Area VTI: 4.48 cm2
AV Area mean vel: 2.04 cm2
AV Mean grad: 2 mmHg
AV Peak grad: 26 mmHg
Ao pk vel: 2.55 m/s

## 2024-04-12 MED ORDER — IODIXANOL 320 MG/ML IV SOLN
INTRAVENOUS | Status: DC | PRN
  Administered 2024-04-12: 90 mL via INTRA_ARTERIAL

## 2024-04-12 MED ORDER — SODIUM CHLORIDE 0.9% FLUSH
3.0000 mL | Freq: Two times a day (BID) | INTRAVENOUS | Status: DC
  Administered 2024-04-13: 3 mL via INTRAVENOUS

## 2024-04-12 MED ORDER — DULOXETINE HCL 60 MG PO CPEP
60.0000 mg | ORAL_CAPSULE | Freq: Every day | ORAL | Status: DC
  Administered 2024-04-13: 60 mg via ORAL
  Filled 2024-04-12 (×2): qty 1

## 2024-04-12 MED ORDER — EZETIMIBE 10 MG PO TABS
10.0000 mg | ORAL_TABLET | Freq: Every day | ORAL | Status: DC
  Administered 2024-04-12 – 2024-04-13 (×2): 10 mg via ORAL
  Filled 2024-04-12 (×2): qty 1

## 2024-04-12 MED ORDER — SODIUM CHLORIDE 0.9 % IV SOLN: INTRAVENOUS | Status: DC | PRN

## 2024-04-12 MED ORDER — MORPHINE SULFATE (PF) 2 MG/ML IV SOLN: 1.0000 mg | INTRAVENOUS | Status: DC | PRN

## 2024-04-12 MED ORDER — HEPARIN (PORCINE) IN NACL 1000-0.9 UT/500ML-% IV SOLN
INTRAVENOUS | Status: DC | PRN
  Administered 2024-04-12: 500 mL

## 2024-04-12 MED ORDER — PROTAMINE SULFATE 10 MG/ML IV SOLN
INTRAVENOUS | Status: DC | PRN
  Administered 2024-04-12: 130 mg via INTRAVENOUS

## 2024-04-12 MED ORDER — HEPARIN (PORCINE) IN NACL 2000-0.9 UNIT/L-% IV SOLN
INTRAVENOUS | Status: DC | PRN
  Administered 2024-04-12: 1000 mL

## 2024-04-12 MED ORDER — CHLORHEXIDINE GLUCONATE 4 % EX SOLN: 30.0000 mL | CUTANEOUS | Status: DC

## 2024-04-12 MED ORDER — ROSUVASTATIN CALCIUM 20 MG PO TABS
20.0000 mg | ORAL_TABLET | Freq: Every day | ORAL | Status: DC
  Administered 2024-04-12 – 2024-04-13 (×2): 20 mg via ORAL
  Filled 2024-04-12 (×2): qty 1

## 2024-04-12 MED ORDER — FENTANYL CITRATE (PF) 100 MCG/2ML IJ SOLN
INTRAMUSCULAR | Status: DC | PRN
  Administered 2024-04-12 (×4): 25 ug via INTRAVENOUS

## 2024-04-12 MED ORDER — SODIUM CHLORIDE 0.9 % IV SOLN: 250.0000 mL | INTRAVENOUS | Status: DC | PRN

## 2024-04-12 MED ORDER — SODIUM CHLORIDE 0.9% FLUSH: 3.0000 mL | INTRAVENOUS | Status: DC | PRN

## 2024-04-12 MED ORDER — CHLORHEXIDINE GLUCONATE 4 % EX SOLN: 60.0000 mL | Freq: Once | CUTANEOUS | Status: DC

## 2024-04-12 MED ORDER — OXYCODONE HCL 5 MG PO TABS: 5.0000 mg | ORAL_TABLET | ORAL | Status: DC | PRN

## 2024-04-12 MED ORDER — ONDANSETRON HCL 4 MG/2ML IJ SOLN: 4.0000 mg | Freq: Four times a day (QID) | INTRAMUSCULAR | Status: DC | PRN

## 2024-04-12 MED ORDER — ALBUMIN HUMAN 5 % IV SOLN: INTRAVENOUS | Status: DC | PRN

## 2024-04-12 MED ORDER — LIDOCAINE-EPINEPHRINE 1 %-1:100000 IJ SOLN
INTRAMUSCULAR | Status: AC
  Filled 2024-04-12: qty 1

## 2024-04-12 MED ORDER — HEPARIN SODIUM (PORCINE) 1000 UNIT/ML IJ SOLN
INTRAMUSCULAR | Status: DC | PRN
  Administered 2024-04-12: 13000 [IU] via INTRAVENOUS

## 2024-04-12 MED ORDER — ACETAMINOPHEN 325 MG PO TABS
650.0000 mg | ORAL_TABLET | Freq: Four times a day (QID) | ORAL | Status: DC | PRN
  Administered 2024-04-13: 650 mg via ORAL
  Filled 2024-04-12: qty 2

## 2024-04-12 MED ORDER — ALBUMIN HUMAN 5 % IV SOLN
INTRAVENOUS | Status: AC
  Filled 2024-04-12: qty 250

## 2024-04-12 MED ORDER — ASPIRIN 81 MG PO TBEC
81.0000 mg | DELAYED_RELEASE_TABLET | Freq: Every day | ORAL | Status: DC
  Administered 2024-04-13: 81 mg via ORAL
  Filled 2024-04-12: qty 1

## 2024-04-12 MED ORDER — ONDANSETRON HCL 4 MG/2ML IJ SOLN
INTRAMUSCULAR | Status: DC | PRN
  Administered 2024-04-12: 4 mg via INTRAVENOUS

## 2024-04-12 MED ORDER — ACETAMINOPHEN 650 MG RE SUPP: 650.0000 mg | Freq: Four times a day (QID) | RECTAL | Status: DC | PRN

## 2024-04-12 MED ORDER — TRAMADOL HCL 50 MG PO TABS: 50.0000 mg | ORAL_TABLET | ORAL | Status: DC | PRN

## 2024-04-12 MED ORDER — NOREPINEPHRINE 4 MG/250ML-% IV SOLN: 0.0000 ug/min | INTRAVENOUS | Status: DC

## 2024-04-12 MED ORDER — FENTANYL CITRATE (PF) 100 MCG/2ML IJ SOLN
INTRAMUSCULAR | Status: AC
  Filled 2024-04-12: qty 2

## 2024-04-12 MED ORDER — HEPARIN SODIUM (PORCINE) 1000 UNIT/ML IJ SOLN
INTRAMUSCULAR | Status: AC
  Filled 2024-04-12: qty 30

## 2024-04-12 MED ORDER — LIDOCAINE HCL (PF) 1 % IJ SOLN
INTRAMUSCULAR | Status: DC | PRN
  Administered 2024-04-12: 12 mL
  Administered 2024-04-12: 15 mL

## 2024-04-12 MED ORDER — GABAPENTIN 100 MG PO CAPS: 100.0000 mg | ORAL_CAPSULE | Freq: Every day | ORAL | Status: DC | PRN

## 2024-04-12 MED ORDER — CEFAZOLIN SODIUM-DEXTROSE 2-4 GM/100ML-% IV SOLN
2.0000 g | Freq: Three times a day (TID) | INTRAVENOUS | Status: AC
  Administered 2024-04-12 (×2): 2 g via INTRAVENOUS
  Filled 2024-04-12 (×2): qty 100

## 2024-04-12 MED ORDER — SODIUM CHLORIDE 0.9 % IV SOLN: INTRAVENOUS | Status: DC

## 2024-04-12 MED ORDER — NITROGLYCERIN IN D5W 200-5 MCG/ML-% IV SOLN: 0.0000 ug/min | INTRAVENOUS | Status: DC

## 2024-04-12 MED ORDER — CHLORHEXIDINE GLUCONATE 0.12 % MT SOLN
15.0000 mL | Freq: Once | OROMUCOSAL | Status: AC
  Administered 2024-04-12: 15 mL via OROMUCOSAL
  Filled 2024-04-12: qty 15

## 2024-04-12 MED ORDER — SODIUM CHLORIDE 0.9 % IV SOLN: INTRAVENOUS | Status: AC

## 2024-04-12 NOTE — Anesthesia Postprocedure Evaluation (Signed)
"   Anesthesia Post Note  Patient: Jeffrey Ewing  Procedure(s) Performed: Transcatheter Aortic Valve Replacement, Transfemoral ECHOCARDIOGRAM, TRANSTHORACIC     Patient location during evaluation: PACU Anesthesia Type: MAC Level of consciousness: awake and alert Pain management: pain level controlled Vital Signs Assessment: post-procedure vital signs reviewed and stable Respiratory status: spontaneous breathing, nonlabored ventilation, respiratory function stable and patient connected to nasal cannula oxygen Cardiovascular status: stable and blood pressure returned to baseline Postop Assessment: no apparent nausea or vomiting Anesthetic complications: no   There were no known notable events for this encounter.  Last Vitals:  Vitals:   04/12/24 1200 04/12/24 1215  BP: (!) 77/44 (!) 75/66  Pulse: (!) 52 (!) 53  Resp: 12 10  Temp:    SpO2: 93% 95%    Last Pain:  Vitals:   04/12/24 0714  TempSrc: Oral  PainSc:                  Voncille Simm D Hurbert Duran      "

## 2024-04-12 NOTE — Progress Notes (Signed)
 Site area: Left groin Site Prior to Removal:  Level 0 Pressure Applied For:10 minutes Manual:   yes Patient Status During Pull:  Stable Post Pull Site:  Level 0 Post Pull Instructions Given:   yes Post Pull Pulses Present: left PT +1 right DP +1 Dressing Applied:  gauze and tegaderm Bedrest begins @ 1310 Comments:

## 2024-04-12 NOTE — Interval H&P Note (Signed)
 History and Physical Interval Note:  04/12/2024 9:47 AM  Jeffrey Ewing  has presented today for surgery, with the diagnosis of Moderate Aortic Stenosis.  The various methods of treatment have been discussed with the patient and family. After consideration of risks, benefits and other options for treatment, the patient has consented to  Procedures: Transcatheter Aortic Valve Replacement, Transfemoral (N/A) ECHOCARDIOGRAM, TRANSTHORACIC (N/A) as a surgical intervention.  The patient's history has been reviewed, patient examined, no change in status, stable for surgery.  I have reviewed the patient's chart and labs.  Questions were answered to the patient's satisfaction.     Con RAMAN Dotsie Gillette

## 2024-04-12 NOTE — Op Note (Signed)
 " [facility]   MULTIDISCIPLINARY HEART VALVE TEAM   TAVR OPERATIVE NOTE   Date of Procedure:  04/12/2024  Preoperative Diagnosis: Moderate Aortic Stenosis   Postoperative Diagnosis: Same   Procedure:   Transcatheter Aortic Valve Replacement - Percutaneous Transfemoral Approach   Edwards Sapien 3 Ultra Resilia THV (size 26 mm, model number [id number] 29A-R, Research implant    Co-Surgeons:  Con Clunes, MD and Ozell Fell, MD   Anesthesiologist:  Franky Bald, MD  Echocardiographer:  Toney Decent, MD  Pre-operative Echo Findings: Moderate aortic stenosis Normal left ventricular systolic function  Post-operative Echo Findings: No paravalvular leak Normal left ventricular systolic function   BRIEF CLINICAL NOTE AND INDICATIONS FOR SURGERY  Jeffrey Ewing is a 84 y.o. male presents for evaluation of moderate aortic stenosis and TAVR as a Progress CAP trial patient.  He has a history of TIA in 2005, HL, PVD and thrombocytopenia.  His main symptoms are shortness of breath and mostly fatigue.  Also has some dizziness but has chronic dysequilibrium.      DETAILS OF THE OPERATIVE PROCEDURE  PREPARATION:    The patient was brought to the operating room on the above mentioned date and appropriate monitoring was established by the anesthesia team. The patient was placed in the supine position on the operating table.  Intravenous antibiotics were administered. The patient was monitored closely throughout the procedure under conscious sedation.  Baseline transthoracic echocardiogram was performed. The patient's abdomen and both groins were prepped and draped in a sterile manner. A time out procedure was performed.   PERIPHERAL ACCESS:    Using the modified Seldinger technique, femoral arterial and venous access was obtained with placement of 6 Fr sheaths on the left side.  A pigtail diagnostic catheter was passed through the left femoral arterial sheath under fluoroscopic  guidance into the aortic root.  A temporary transvenous pacemaker catheter was passed through the left femoral venous sheath under fluoroscopic guidance into the right ventricle.  The pacemaker was tested to ensure stable lead placement and pacemaker capture. Aortic root angiography was performed in order to determine the optimal angiographic angle for valve deployment.   TRANSFEMORAL ACCESS:   Percutaneous transfemoral access and sheath placement was performed using ultrasound guidance.  The right common femoral artery was cannulated using a micropuncture needle and appropriate location was verified using hand injection angiogram.  A pair of Abbott Perclose percutaneous closure devices were placed and a 6 French sheath replaced into the femoral artery.  The patient was heparinized systemically and ACT verified > 250 seconds.    A 16 Fr transfemoral E-sheath was introduced into the right common femoral artery after progressively dilating over an Amplatz superstiff wire. An AL-2 catheter was used to direct a straight-tip exchange length wire across the native aortic valve into the left ventricle. This was exchanged out for a pigtail catheter and position was confirmed in the LV apex. Simultaneous LV and Ao pressures were recorded.  The LVEDP was 9 mmHg.  The pigtail catheter was exchanged for a Safari wire in the LV apex.   TRANSCATHETER HEART VALVE DEPLOYMENT:   An Edwards Sapien 3 Ultra transcatheter heart valve (29 mm) was prepared and crimped per manufacturer's guidelines, and the proper orientation of the valve was confirmed on the [employer] delivery system. The valve was advanced through the introducer sheath using normal technique until in an appropriate position in the abdominal aorta beyond the sheath tip. The balloon was then retracted and using  the fine-tuning wheel was centered on the valve. The valve was then advanced across the aortic arch using appropriate flexion of the catheter.  The valve was carefully positioned across the aortic valve annulus. The Commander catheter was retracted using normal technique. Once final position of the valve was confirmed by angiographic assessment, the valve was deployed during rapid ventricular pacing to maintain systolic blood pressure < 50 mmHg and pulse pressure < 10 mmHg. The balloon inflation was held for >3 seconds after reaching full deployment volume. Once the balloon was fully deflated the balloon was retracted into the ascending aorta and valve function was assessed using echocardiography. There was felt to be no paravalvular leak and no central aortic insufficiency.  There was no gradient measurable on invasive hemodynamics.  Aortic root angiography demonstrated trace AI.  The patient's hemodynamic recovery following valve deployment was good.  The deployment balloon and guidewire were both removed.    PROCEDURE COMPLETION:   The sheath was removed and femoral artery closure performed.  Protamine  was administered once femoral arterial repair was complete. The temporary pacemaker, pigtail catheter and femoral sheaths were removed with manual pressure used for venous hemostasis.  A Mynx femoral closure device was utilized following removal of the diagnostic sheath in the left femoral artery.  The patient tolerated the procedure well and was transported to the cath lab recovery area in stable condition. There were no immediate intraoperative complications. All sponge instrument and needle counts were verified correct at completion of the operation.   No blood products were administered during the operation.  The patient received a total of 90 mL of intravenous contrast during the procedure.   Con GORMAN Clunes, MD 04/12/2024 8:58 PM      "

## 2024-04-12 NOTE — Transfer of Care (Signed)
 Immediate Anesthesia Transfer of Care Note  Patient: Jeffrey Ewing  Procedure(s) Performed: Transcatheter Aortic Valve Replacement, Transfemoral ECHOCARDIOGRAM, TRANSTHORACIC  Patient Location: Cath Lab  Anesthesia Type:MAC  Level of Consciousness: awake, alert , oriented, and patient cooperative  Airway & Oxygen Therapy: Patient Spontanous Breathing and Patient connected to nasal cannula oxygen  Post-op Assessment: Report given to RN, Post -op Vital signs reviewed and stable, and Post -op Vital signs reviewed and unstable, Anesthesiologist notified  Post vital signs: Reviewed, stable, and unstable--MDA called d/t hypotension; Albumin  administered by CRNA in cath lab PACU  Last Vitals:  Vitals Value Taken Time  BP 72/53 04/12/24 11:52  Temp    Pulse 53 04/12/24 11:58  Resp 12 04/12/24 11:58  SpO2 91 % 04/12/24 11:58  Vitals shown include unfiled device data.  Last Pain:  Vitals:   04/12/24 0714  TempSrc: Oral  PainSc:       Patients Stated Pain Goal: 0 (04/12/24 0710)  Complications: There were no known notable events for this encounter.

## 2024-04-12 NOTE — Anesthesia Procedure Notes (Signed)
 Arterial Line Insertion Start/End1/13/2026 7:34 AM, 04/12/2024 7:40 AM Performed by: Zelphia Norleen HERO, CRNA  Patient location: Pre-op. Preanesthetic checklist: patient identified, IV checked, site marked, risks and benefits discussed, surgical consent, monitors and equipment checked, pre-op evaluation, timeout performed and anesthesia consent Lidocaine  1% used for infiltration Left, radial was placed Catheter size: 20 G Hand hygiene performed  and maximum sterile barriers used   Attempts: 1 Procedure performed using ultrasound to evaluate access site. Ultrasound Notes:relevant anatomy identified, ultrasound used to visualize needle entry and vessel patent under ultrasound. Following insertion, dressing applied. Post procedure assessment: normal and unchanged  Patient tolerated the procedure well with no immediate complications.

## 2024-04-12 NOTE — Progress Notes (Signed)
 Site area: A- line Left radial   Site Prior to Removal:  Level 0 Pressure Applied For: 15 minutes Manual:   Yes Patient Status During Pull:  stable Post Pull Site:  Level 0 Post Pull Instructions Given:  yes Post Pull Pulses Present:left radial +2 Dressing Applied:  gauze and tegaderm Bedrest begins:   Comments:

## 2024-04-12 NOTE — Progress Notes (Signed)
 Albumin  complete. BP 85/42 (54). Patient is alert and oriented. Denies any pain or distress. Dr. Tilford informed. Will continue to monitor.Cane Dubray E

## 2024-04-12 NOTE — Discharge Summary (Incomplete)
 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 Active Problems:   Malignant melanoma of face excluding eyelid, nose, lip, and ear (HCC)   Prostate cancer (HCC)   Hyperlipidemia   Renal artery stenosis   Thrombocytopenia   Moderate aortic stenosis   Allergies Allergies[1]  Diagnostic Studies/Procedures    TAVR OPERATIVE NOTE     Date of Procedure:                04/12/2024   Preoperative Diagnosis:      Severe Aortic Stenosis    Postoperative Diagnosis:    Same    Procedure:        Transcatheter Aortic Valve Replacement - Percutaneous Transfemoral Approach             Edwards Sapien 3 Ultra Resilia THV (size 26 mm, model number [id number] 29A-R, Research implant)              Co-Surgeons:                        Con Clunes, MD and Ozell Fell, MD   Anesthesiologist:                  Franky Bald, MD   Echocardiographer:              Toney Decent, MD   Pre-operative Echo Findings: Moderate aortic stenosis Normal left ventricular systolic function   Post-operative Echo Findings: No paravalvular leak Normal/unchanged left ventricular systolic function _____________    Echo 04/13/24: completed but pending formal read at the time of discharge   History of Present Illness     Jeffrey Ewing is a 84 y.o. male with a history of TIA 2005, prostate cancer, melanoma, HLD, bilateral renal artery stenosis, thrombocytopenia, peripheral neuropathy, and moderate aortic stenosis who presented to [facility] on 04/12/24 for planned TAVR through the  PROGRESS CAP trial.   The pt was noted to have a heart murmur and aortic stenosis and referred to Dr. Red. The patient was very anxious about his heart valve condition and was followed closely. He developed NYHA class II symptoms of SOB but only had moderate AS. Echo 01/13/24 showed EF 60%, with mean grad 25 mmHg, Vmax 3.15 m/s, AVA 1.12 cm2, DVI 0.29, SVI 42. He was screened for Progress CAP trial and qualified. [facility] 02/23/24 showed mild to moderate distal left main stenosis of 40%. Patent LAD with mild calcific 40% proximal vessel stenosis. Patent left circumflex with severe calcific 75% ostial stenosis and severe 75% ostial OM1 stenosis. Patent RCA with mild nonobstructive plaquing, moderate ostial PDA stenosis of 50%. There was moderate aortic stenosis mean gradient 27 mmHg, calculated aortic valve area 1.4 cm. Normal intracardiac diastolic filling pressures with mean right atrial pressure 5 mmHg, PA pressure 24/8 mean 17 mmHg, wedge pressure 9 mmHg, LVEDP 11 mmHg, cardiac output 7 L/min, cardiac index 3.3 L/min/m. Medical therapy for CAD.   The patient was evaluated by the multidisciplinary valve team and felt to have moderate, symptomatic aortic stenosis and to be  a suitable candidate for TAVR through the continued access program of there Progress trial, which was set up for 04/12/24.   Hospital Course     Consultants: none   Moderate AS:  -- S/p TAVR with a 26 mm Edwards Sapien 3 Ultra Resilia THV via the TF approach on 04/12/24 through the PROGRESS CAP trial. -- Post operative echo completed but pending formal read -- Groin sites are stable.  -- ECG with NSR and no high grade heart block. -- NYHA class I.  -- CCS class I. -- Continue Asprin 81mg  daily. -- Met with cardiac rehab to discuss CRP phase II.  -- Plan for discharge home today with close follow up in the outpatient setting.   RAS: -- BP has been under good control. -- Continue medial therapy with Asprin 81mg  daily and  Crestor  20mg  daily.  Thrombocytopenia: -- Platelets 101--> 77 after TAVR.  -- Likely reactive.   CAD: -- Cath 02/23/24 showed mild to moderate distal left main stenosis of 40%. Patent LAD with mild calcific 40% proximal vessel stenosis. Patent left circumflex with severe calcific 75% ostial stenosis and severe 75% ostial OM1 stenosis. Patent RCA with mild nonobstructive plaquing, moderate ostial PDA stenosis of 50%. 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 Consideration should be given toward follow-up imaging by CT or MRI in 12 months. -- This will be discussed in the outpatient setting.   _____________  Discharge Vitals Blood pressure 132/62, pulse 80, temperature 98.7 F (37.1 C), temperature source Oral, resp. rate 18, height 5' 10 (1.778 m), weight 96.6 kg, SpO2 95%.  Filed Weights   04/11/24 1200 04/12/24 0710 04/13/24 0245  Weight: 95.3 kg 90.3 kg 96.6 kg     GEN: Well nourished, well developed in no acute distress NECK: No JVD CARDIAC: RRR, 2/6 SEM loudest @ RUSB. No rubs, gallops RESPIRATORY:  Clear to auscultation without rales, wheezing or rhonchi  ABDOMEN: Soft, non-tender, non-distended EXTREMITIES:  No edema; No deformity.  Groin sites clear without hematoma or ecchymosis.    Disposition   Pt is being discharged home today in good condition.  Follow-up Plans & Appointments     Follow-up Information     Sebastian Lamarr SAUNDERS, PA-C. Go on 04/18/2024.   Specialties: Cardiology, Radiology Why: @ 11:10am, please arrive at least 20 min early. Contact information: [address] [place] [address] [phone]                Discharge Instructions     Amb Referral to Cardiac Rehabilitation   Complete by: As directed    Diagnosis: Valve Repair   Valve: Aortic Comment - TAVR   After  initial evaluation and assessments completed: Virtual Based Care may be provided alone or in conjunction with Phase 2 Cardiac Rehab based on patient barriers.: Yes   Intensive Cardiac Rehabilitation (ICR) [facility] location only OR Traditional Cardiac Rehabilitation (TCR) *If criteria for ICR are not met will enroll in TCR [facility] only): Yes       Discharge Medications   Allergies as of 04/13/2024       Reactions   Mobic [meloxicam] Nausea Only   Zoloft [sertraline] Nausea Only        Medication List     TAKE these medications    aspirin  EC 81 MG tablet Take 1 tablet (81 mg total) by mouth  daily. Swallow whole.   DULoxetine  60 MG capsule Commonly known as: CYMBALTA  Take 60 mg by mouth daily.   ezetimibe  10 MG tablet Commonly known as: ZETIA  TAKE 1 TABLET(10 MG) BY MOUTH DAILY   FOLTANX PO Take 1 capsule by mouth daily.   gabapentin  100 MG capsule Commonly known as: NEURONTIN  Take 100 mg by mouth daily as needed (Nerve Pain).   metoprolol  succinate 25 MG 24 hr tablet Commonly known as: Toprol  XL Take 0.5 tablets (12.5 mg total) by mouth daily.   Multi Vitamin Tabs Take 1 tablet by mouth daily.   rosuvastatin  20 MG tablet Commonly known as: CRESTOR  Take 20 mg by mouth daily.         Outstanding Labs/Studies   none  ______________________  Duration of Discharge Encounter: APP Time: 13 minutes    Signed, Lamarr Hummer, PA-C 04/13/2024, 11:02 AM [id number]  Patient seen, examined on 04/13/24 am. Available data reviewed. Agree with findings, assessment, and plan as outlined by Izetta Hummer, PA-C. On exam: Vitals:   04/13/24 0300 04/13/24 0802  BP: (!) 140/66 132/62  Pulse: 71 80  Resp: 20 18  Temp: 99.2 F (37.3 C) 98.7 F (37.1 C)  SpO2: 95% 95%   Pt is alert and oriented, NAD HEENT: normal Neck: JVP - normal Lungs: CTA bilaterally CV: RRR with 2/6 SEM at the RUSB Abd: soft, NT, Positive BS, no hepatomegaly Ext: no C/C/E, distal pulses  intact and equal, BL groin sites clear Skin: warm/dry no rash  Tele with sinus rhythm and no significant arrhythmia. Plt count decreased to 77k, agree likely reactive post-TAVR. Pt without complaints. Echo shows mean gradient 5 mmHg and no AI, normal LVEF. Pt medically stable for DC. MD time spent conducting DC is 20 minutes and includes personal exam of the patient, discussion of DC instructions, review of labs, review of echo study.   Ozell Fell, M.D. 04/14/2024 5:53 AM     [1]  Allergies Allergen Reactions   Mobic [Meloxicam] Nausea Only   Zoloft [Sertraline] Nausea Only   "

## 2024-04-12 NOTE — Progress Notes (Signed)
 Patient arrived from cath lab s/p TAVR w/Dr. Wonda.  Patient is INAD and A&O x4.  Rt groin has very small amount of bloody drainage on dressing and is soft.  Left groin is soft with clean dry dressing.  Telemetry monitor is applied and CCMD notified.  Patient is oriented to unit and room to include call light and phone.  All needs addressed.

## 2024-04-12 NOTE — Progress Notes (Signed)
" °  [facility]ASC[facility] CENTER   MULTIDISCIPLINARY HEART VALVE TEAM  Patient doing well s/p TAVR. He is hemodynamically stable but BP is soft. Treated with 250 ml albumin  and slowly improving. Groin sites stable- left venous sheath still in place. ECG with sinus brady but no high grade block. Transferred from cath lab holding to 4E when BP stable.  Plan to transfer to from cath lab holding to 4E when bed available.    Lamarr Hummer PA-C  [facility]  Pager [phone]  "

## 2024-04-12 NOTE — Op Note (Signed)
 " HEART AND VASCULAR CENTER   MULTIDISCIPLINARY HEART VALVE TEAM   TAVR OPERATIVE NOTE   Date of Procedure:  04/12/2024  Preoperative Diagnosis: Severe Aortic Stenosis   Postoperative Diagnosis: Same   Procedure:   Transcatheter Aortic Valve Replacement - Percutaneous Transfemoral Approach  Edwards Sapien 3 Ultra Resilia THV (size 26 mm, model number [id number], Research implant)   Co-Surgeons:  Con Clunes, MD and Ozell Fell, MD  Anesthesiologist:  Franky Bald, MD  Echocardiographer:  Toney Decent, MD  Pre-operative Echo Findings: Moderate aortic stenosis Normal left ventricular systolic function  Post-operative Echo Findings: No paravalvular leak Normal/unchanged left ventricular systolic function  BRIEF CLINICAL NOTE AND INDICATIONS FOR SURGERY  84 year old male with moderate symptomatic aortic stenosis, enrolled in the progress-CAP trial.  After meeting inclusion criteria and undergoing review by our multidisciplinary heart valve team, the patient presents today to undergo transfemoral TAVR.  During the course of the patient's preoperative work up they have been evaluated comprehensively by a multidisciplinary team of specialists coordinated through the Multidisciplinary Heart Valve Clinic in the [facility].  They have been demonstrated to suffer from moderate symptomatic aortic stenosis as noted above. The patient has been counseled extensively as to the relative risks and benefits of all options for the treatment of severe aortic stenosis including long term medical therapy, conventional surgery for aortic valve replacement, and transcatheter aortic valve replacement.  The patient has been independently evaluated in formal cardiac surgical consultation by Dr Clunes, who deemed the patient appropriate for TAVR. Based upon review of all of the patient's preoperative diagnostic tests they are felt to be candidate for transcatheter aortic valve  replacement using the transfemoral approach as an alternative to conventional surgery.    Following the decision to proceed with transcatheter aortic valve replacement, a discussion has been held regarding what types of management strategies would be attempted intraoperatively in the event of life-threatening complications, including whether or not the patient would be considered a candidate for the use of cardiopulmonary bypass and/or conversion to open sternotomy for attempted surgical intervention.  The patient has been advised of a variety of complications that might develop peculiar to this approach including but not limited to risks of death, stroke, paravalvular leak, aortic dissection or other major vascular complications, aortic annulus rupture, device embolization, cardiac rupture or perforation, acute myocardial infarction, arrhythmia, heart block or bradycardia requiring permanent pacemaker placement, congestive heart failure, respiratory failure, renal failure, pneumonia, infection, other late complications related to structural valve deterioration or migration, or other complications that might ultimately cause a temporary or permanent loss of functional independence or other long term morbidity.  The patient provides full informed consent for the procedure as described and all questions were answered preoperatively.  DETAILS OF THE OPERATIVE PROCEDURE  PREPARATION:   The patient is brought to the operating room on the above mentioned date. The patient is placed in the supine position on the operating table.  Intravenous antibiotics are administered. The patient is monitored closely throughout the procedure under conscious sedation.  Baseline transthoracic echocardiogram is performed. The patient's chest, abdomen, both groins, and both lower extremities are prepared and draped in a sterile manner. A time out procedure is performed.   PERIPHERAL ACCESS:   Using ultrasound guidance, femoral  arterial and venous access is obtained with placement of 6 Fr sheaths on the left side.  US  images are digitally captured and stored in the patient's chart. A pigtail diagnostic catheter was passed through the  femoral arterial sheath under fluoroscopic guidance into the aortic root.  A temporary transvenous pacemaker catheter was passed through the femoral venous sheath under fluoroscopic guidance into the right ventricle.  The pacemaker was tested to ensure stable lead placement and pacemaker capture. Aortic root angiography was performed in order to determine the optimal angiographic angle for valve deployment.  TRANSFEMORAL ACCESS:  A micropuncture technique is used to access the right femoral artery under fluoroscopic and ultrasound guidance.  2 Perclose devices are deployed at 10' and 2' positions to 'PreClose' the femoral artery. An 8 French sheath is placed and then an Amplatz Superstiff wire is advanced through the sheath. This is changed out for a 16 French transfemoral E-Sheath after progressively dilating over the Superstiff wire.  An AL-2 catheter was used to direct a straight-tip exchange length wire across the native aortic valve into the left ventricle. This was exchanged out for a pigtail catheter and position was confirmed in the LV apex. Simultaneous LV and Ao pressures were recorded.  The pigtail catheter was exchanged for a Safari wire in the LV apex.    BALLOON AORTIC VALVULOPLASTY:  Not performed  TRANSCATHETER HEART VALVE DEPLOYMENT:  An Edwards Sapien 3 Ultra Resilia transcatheter heart valve (size 29 mm) was prepared and crimped per manufacturer's guidelines, and the proper orientation of the valve is confirmed on the [employer] delivery system. The valve was advanced through the introducer sheath using normal technique until in an appropriate position in the abdominal aorta beyond the sheath tip. The balloon was then retracted and using the fine-tuning wheel was centered on  the valve. The valve was then advanced across the aortic arch using appropriate flexion of the catheter. The valve was carefully positioned across the aortic valve annulus. The Commander catheter was retracted using normal technique. Once final position of the valve has been confirmed by angiographic assessment, the valve is deployed while temporarily holding ventilation and during rapid ventricular pacing to maintain systolic blood pressure < 50 mmHg and pulse pressure < 10 mmHg. The balloon inflation is held for >3 seconds after reaching full deployment volume. Once the balloon has fully deflated the balloon is retracted into the ascending aorta and valve function is assessed using echocardiography. The patient's hemodynamic recovery following valve deployment is good.  The deployment balloon and guidewire are both removed. Echo demostrated acceptable post-procedural gradients, stable mitral valve function, and no aortic insufficiency.  Aortic root angiography is performed and demonstrates trace aortic regurgitation.  Postprocedure echo mean gradient is 2 mmHg.  There is no gradient by invasive cardiac catheterization with simultaneous pressure measurement in the LV and AO.   PROCEDURE COMPLETION:  The sheath was removed and femoral artery closure is performed using the 2 previously deployed Perclose devices.  Protamine  is administered once femoral arterial repair was complete. The site is clear with no evidence of bleeding or hematoma after the sutures are tightened. The temporary pacemaker and pigtail catheters are removed. Mynx closure is used for contralateral femoral arterial hemostasis for the 6 Fr sheath.  The patient tolerated the procedure well and is transported to the recovery area in stable condition. There were no immediate intraoperative complications. All sponge instrument and needle counts are verified correct at completion of the operation.   The patient received a total of 90 mL of  intravenous contrast during the procedure.  EBL: minimal  LVEDP: 9 mmHg   Ozell Fell, MD 04/12/2024 12:47 PM  "

## 2024-04-12 NOTE — Discharge Instructions (Signed)

## 2024-04-12 NOTE — Anesthesia Preprocedure Evaluation (Addendum)
"                                    Anesthesia Evaluation  Patient identified by MRN, date of birth, ID band Patient awake    Reviewed: Allergy & Precautions, NPO status , Patient's Chart, lab work & pertinent test results  Airway Mallampati: II  TM Distance: >3 FB Neck ROM: Full    Dental  (+) Teeth Intact, Dental Advisory Given   Pulmonary former smoker   breath sounds clear to auscultation       Cardiovascular + Peripheral Vascular Disease  + Valvular Problems/Murmurs AS  Rhythm:Regular Rate:Normal + Systolic murmurs Echo:  1. Left ventricular ejection fraction, by estimation, is 60 to 65%. The  left ventricle has normal function. The left ventricle has no regional  wall motion abnormalities. There is mild left ventricular hypertrophy.  Left ventricular diastolic parameters  are consistent with Grade I diastolic dysfunction (impaired relaxation).  The average left ventricular global longitudinal strain is -16.6 %. The  global longitudinal strain is abnormal.   2. Right ventricular systolic function is normal. The right ventricular  size is normal. Tricuspid regurgitation signal is inadequate for assessing  PA pressure.   3. Left atrial size was mildly dilated.   4. The mitral valve is normal in structure. No evidence of mitral valve  regurgitation. No evidence of mitral stenosis.   5. The aortic valve is abnormal. There is moderate calcification of the  aortic valve. Aortic valve regurgitation is mild. Moderate aortic valve  stenosis. Aortic valve area, by VTI measures 1.12 cm. Aortic valve mean  gradient measures 25.0 mmHg. Aortic  valve Vmax measures 3.15 m/s.   6. Aortic dilatation noted. There is mild dilatation of the aortic root,  measuring 39 mm.   7. The inferior vena cava is normal in size with greater than 50%  respiratory variability, suggesting right atrial pressure of 3 mmHg.     Neuro/Psych CVA    GI/Hepatic negative GI ROS, Neg liver  ROS,,,  Endo/Other  negative endocrine ROS    Renal/GU Renal disease     Musculoskeletal  (+) Arthritis ,    Abdominal   Peds  Hematology  (+) Blood dyscrasia, anemia   Anesthesia Other Findings   Reproductive/Obstetrics                              Anesthesia Physical Anesthesia Plan  ASA: 4  Anesthesia Plan: MAC   Post-op Pain Management: Minimal or no pain anticipated   Induction: Intravenous  PONV Risk Score and Plan: 0 and Propofol  infusion and Ondansetron   Airway Management Planned: Natural Airway and Simple Face Mask  Additional Equipment: None  Intra-op Plan:   Post-operative Plan:   Informed Consent: I have reviewed the patients History and Physical, chart, labs and discussed the procedure including the risks, benefits and alternatives for the proposed anesthesia with the patient or authorized representative who has indicated his/her understanding and acceptance.     Dental advisory given  Plan Discussed with: CRNA  Anesthesia Plan Comments:         Anesthesia Quick Evaluation  "

## 2024-04-13 ENCOUNTER — Inpatient Hospital Stay (HOSPITAL_COMMUNITY)

## 2024-04-13 DIAGNOSIS — D696 Thrombocytopenia, unspecified: Secondary | ICD-10-CM | POA: Diagnosis not present

## 2024-04-13 DIAGNOSIS — Z952 Presence of prosthetic heart valve: Secondary | ICD-10-CM

## 2024-04-13 DIAGNOSIS — E785 Hyperlipidemia, unspecified: Secondary | ICD-10-CM | POA: Diagnosis not present

## 2024-04-13 DIAGNOSIS — I739 Peripheral vascular disease, unspecified: Secondary | ICD-10-CM | POA: Diagnosis not present

## 2024-04-13 DIAGNOSIS — I350 Nonrheumatic aortic (valve) stenosis: Secondary | ICD-10-CM | POA: Diagnosis not present

## 2024-04-13 LAB — CBC
HCT: 37.4 % — ABNORMAL LOW (ref 39.0–52.0)
Hemoglobin: 12.9 g/dL — ABNORMAL LOW (ref 13.0–17.0)
MCH: 35.6 pg — ABNORMAL HIGH (ref 26.0–34.0)
MCHC: 34.5 g/dL (ref 30.0–36.0)
MCV: 103.3 fL — ABNORMAL HIGH (ref 80.0–100.0)
Platelets: 77 K/uL — ABNORMAL LOW (ref 150–400)
RBC: 3.62 MIL/uL — ABNORMAL LOW (ref 4.22–5.81)
RDW: 13.5 % (ref 11.5–15.5)
WBC: 10.5 K/uL (ref 4.0–10.5)
nRBC: 0 % (ref 0.0–0.2)

## 2024-04-13 LAB — ECHOCARDIOGRAM COMPLETE
AR max vel: 2.61 cm2
AV Area VTI: 2.18 cm2
AV Area mean vel: 2.21 cm2
AV Mean grad: 4.9 mmHg
AV Peak grad: 8.3 mmHg
Ao pk vel: 1.44 m/s
Calc EF: 65 %
Height: 70 in
S' Lateral: 3.7 cm
Single Plane A2C EF: 64.6 %
Single Plane A4C EF: 68.3 %
Weight: 3407.43 [oz_av]

## 2024-04-13 LAB — BASIC METABOLIC PANEL WITH GFR
Anion gap: 11 (ref 5–15)
BUN: 12 mg/dL (ref 8–23)
CO2: 22 mmol/L (ref 22–32)
Calcium: 8.5 mg/dL — ABNORMAL LOW (ref 8.9–10.3)
Chloride: 100 mmol/L (ref 98–111)
Creatinine, Ser: 0.86 mg/dL (ref 0.61–1.24)
GFR, Estimated: >60 mL/min
Glucose, Bld: 116 mg/dL — ABNORMAL HIGH (ref 70–99)
Potassium: 4 mmol/L (ref 3.5–5.1)
Sodium: 134 mmol/L — ABNORMAL LOW (ref 135–145)

## 2024-04-13 LAB — MAGNESIUM: Magnesium: 2 mg/dL (ref 1.7–2.4)

## 2024-04-13 NOTE — Progress Notes (Signed)
 Mobility Specialist Progress Note;   04/13/24 0955  Mobility  Activity Ambulated with assistance;Pivoted/transferred from bed to chair  Level of Assistance Contact guard assist, steadying assist  Assistive Device None  Distance Ambulated (ft) 250 ft  Activity Response Tolerated well  Mobility Referral Yes  Mobility visit 1 Mobility  Mobility Specialist Start Time (ACUTE ONLY) 0955  Mobility Specialist Stop Time (ACUTE ONLY) 1003  Mobility Specialist Time Calculation (min) (ACUTE ONLY) 8 min   Pt eager for mobility. Required light MinG assistance during ambulation for safety as pt states feeling a little unsteady. Some audible dyspnea during ambulation, however VSS on RA. Requested to sit in chair at [facility]. Pt left in chair with all needs met. Wife present.   Lauraine Erm Mobility Specialist Please contact via SecureChat or [employer] [phone]

## 2024-04-13 NOTE — Progress Notes (Signed)
 CARDIAC REHAB PHASE I   Post TAVR education including site care, restrictions, risk factors, heart healthy diet, exercise guidelines, and CRP2 reviewed. Patient eager to participate in cardiac rehab. Will refer to [facility] for CRP2. All questions and concerns addressed.    [id number] Jeffrey Ewing [place], RN BSN 04/13/2024 9:17 AM

## 2024-04-13 NOTE — Progress Notes (Signed)
" °  Echocardiogram 2D Echocardiogram has been performed.  Tinnie FORBES Gosling RDCS 04/13/2024, 8:36 AM "

## 2024-04-14 ENCOUNTER — Telehealth: Payer: Self-pay

## 2024-04-14 NOTE — Telephone Encounter (Signed)
 I attempted to contact patient for [facility] documentation and left message asking patient to return call.

## 2024-04-14 NOTE — Telephone Encounter (Signed)
 Patient contacted regarding discharge from [facility] on 04/13/2024.  Patient understands to follow up with provider Izetta Hummer, PA-C on 04/18/2024 at 11:10AM at [address]. Patient understands discharge instructions? Yes Patient understands medications and regiment? Yes Patient understands to bring all medications to this visit? Yes

## 2024-04-18 ENCOUNTER — Ambulatory Visit: Attending: Physician Assistant | Admitting: Physician Assistant

## 2024-04-18 VITALS — BP 126/58 | HR 71 | Ht 70.0 in | Wt 214.0 lb

## 2024-04-18 DIAGNOSIS — I2510 Atherosclerotic heart disease of native coronary artery without angina pectoris: Secondary | ICD-10-CM

## 2024-04-18 DIAGNOSIS — I350 Nonrheumatic aortic (valve) stenosis: Secondary | ICD-10-CM

## 2024-04-18 DIAGNOSIS — I701 Atherosclerosis of renal artery: Secondary | ICD-10-CM

## 2024-04-18 DIAGNOSIS — K869 Disease of pancreas, unspecified: Secondary | ICD-10-CM

## 2024-04-18 DIAGNOSIS — Z952 Presence of prosthetic heart valve: Secondary | ICD-10-CM

## 2024-04-18 MED ORDER — AMOXICILLIN 500 MG PO TABS: 2000.0000 mg | ORAL_TABLET | ORAL | 12 refills | Status: AC

## 2024-04-18 NOTE — Progress Notes (Signed)
 " HEART AND VASCULAR CENTER   MULTIDISCIPLINARY HEART VALVE CLINIC                                     Cardiology Office Note:    Date:  04/18/2024   ID:  Jeffrey Ewing, DOB 03-Apr-1940, MRN [id number]  PCP:  Shayne Anes, MD  [facility] Cardiologist:  Lurena MARLA Red, MD  [facility] Structural heart: Ozell Fell, MD [facility] Electrophysiologist:  None   Referring MD: Shayne Anes, MD   [facility] s/p TAVR  History of Present Illness:    Jeffrey Ewing is a 84 y.o. male with a hx of TIA 2005, prostate cancer, melanoma, HLD, bilateral renal artery stenosis, thrombocytopenia, peripheral neuropathy, and moderate aortic stenosis s/p TAVR (04/12/24) through the Progress CAP trial who presents to clinic for follow up.   The pt was noted to have a heart murmur and aortic stenosis and referred to Dr. Red. The patient was very anxious about his heart valve condition and was followed closely. He developed NYHA class II symptoms of SOB but only had moderate AS. Echo 01/13/24 showed EF 60%, with mean grad 25 mmHg, Vmax 3.15 m/s, AVA 1.12 cm2, DVI 0.29, SVI 42. He was screened for Progress CAP trial and qualified. [facility] 02/23/24 showed mild to moderate distal left main stenosis of 40%. Patent LAD with mild calcific 40% proximal vessel stenosis. Patent left circumflex with severe calcific 75% ostial stenosis and severe 75% ostial OM1 stenosis. Patent RCA with mild nonobstructive plaquing, moderate ostial PDA stenosis of 50%. There was moderate aortic stenosis mean gradient 27 mmHg, calculated aortic valve area 1.4 cm. Normal intracardiac diastolic filling pressures with mean right atrial pressure 5 mmHg, PA pressure 24/8 mean 17 mmHg, wedge pressure 9 mmHg, LVEDP 11 mmHg, cardiac output 7 L/min, cardiac index 3.3 L/min/m. Medical therapy for CAD. He underwent TAVR with a 26 mm Edwards Sapien 3 Ultra Resilia THV via the TF approach on 04/12/24 through the PROGRESS CAP trial. Post operative echo  showed EF 60%, normally functioning TAVR with a mean gradient of 5 mmHg and no PVL.  Today the patient presents to clinic for follow up. Here with his wife. He is feeling so much better since TAVR that it is amazing. He has more energy, feels more positive and has less disequilibrium. No CP or SOB. No LE edema, orthopnea or PND. No dizziness or syncope. No blood in stool or urine. No palpitations.     Past Medical History:  Diagnosis Date   History of kidney stones    History of prostate cancer 2008   Hyperlipidemia    Melanoma (HCC) 2001   left cheek   Moderate aortic stenosis    Osteoarthritis    shoulders, knees   S/P TAVR (transcatheter aortic valve replacement) through PROGRESS CAP trial 04/12/2024   S/p TAVR with a 29 mm Edwards Sapien 3 Ultra Resilia THV via the TF approach by Dr. Fell and Dr Daniel   TIA (transient ischemic attack) 2005     Current Medications: Active Medications[1]    ROS:   Please see the history of present illness.    All other systems reviewed and are negative.  EKGs   EKG Interpretation Date/Time:  Monday April 18 2024 11:26:02 EST Ventricular Rate:  71 PR Interval:  164 QRS Duration:  80 QT Interval:  396 QTC Calculation: 430 R Axis:  1  Text Interpretation: Sinus rhythm with Premature atrial complexes When compared with ECG of 13-Apr-2024 02:26, Premature atrial complexes are now Present Confirmed by Sebastian Pagan [phone]) on 04/18/2024 11:41:18 AM   Risk Assessment/Calculations:           Physical Exam:    VS:  BP (!) 126/58   Pulse 71   Ht 5' 10 (1.778 m)   Wt 214 lb (97.1 kg)   SpO2 96%   BMI 30.71 kg/m     Wt Readings from Last 3 Encounters:  04/18/24 214 lb (97.1 kg)  04/13/24 212 lb 15.4 oz (96.6 kg)  03/21/24 210 lb (95.3 kg)     GEN: Well nourished, well developed in no acute distress NECK: No JVD CARDIAC: RRR, soft flow murmur @ RUSB. No rubs, gallops RESPIRATORY:  Clear to auscultation without rales,  wheezing or rhonchi  ABDOMEN: Soft, non-tender, non-distended EXTREMITIES:  No edema; No deformity.  Groin sites clear without hematoma or ecchymosis.   ASSESSMENT:    1. S/P TAVR (transcatheter aortic valve replacement)   2. Renal artery stenosis   3. Coronary artery disease involving native coronary artery of native heart without angina pectoris   4. Lesion of pancreas     PLAN:    In order of problems listed above:  Moderate AS s/p TAVR:  -- Pt doing excellent s/p TAVR.  -- ECG with no HAVB.  -- Groin sites healing well.  -- SBE prophylaxis discussed; I have RX'd amoxicillin .   -- Continue Aspirin  81mg  daily. -- Cleared to resume all activities without restriction. -- I will see back for 1 month echo and OV.   RAS: -- BP has been under good control. -- Continue medial therapy with Asprin 81mg  daily and Crestor  20mg  daily.  CAD: -- Cath 02/23/24 showed mild to moderate distal left main stenosis of 40%. Patent LAD with mild calcific 40% proximal vessel stenosis. Patent left circumflex with severe calcific 75% ostial stenosis and severe 75% ostial OM1 stenosis. Patent RCA with mild nonobstructive plaquing, moderate ostial PDA stenosis of 50%. -- Continue medial therapy with Asprin 81mg  daily and Crestor  20mg  daily.   Pancreas lesion: -- Pre TAVR CTs noted cystic lesions are present within the pancreas with the largest measuring 0.9 cm. Consideration should be given toward follow-up imaging by CT or MRI in 12 months. -- He said has been followed for this by Dr. Shayne for a long time. He will discuss need for further imaging with him.     Cardiac Rehabilitation Eligibility Assessment  The patient is ready to start cardiac rehabilitation from a cardiac standpoint.     Medication Adjustments/Labs and Tests Ordered: Current medicines are reviewed at length with the patient today.  Concerns regarding medicines are outlined above.  Orders Placed This Encounter  Procedures    EKG 12-Lead   ECHOCARDIOGRAM COMPLETE   Meds ordered this encounter  Medications   amoxicillin  (AMOXIL ) 500 MG tablet    Sig: Take 4 tablets (2,000 mg total) by mouth as directed. 1 hour prior to dental work including cleanings    Dispense:  12 tablet    Refill:  12    Supervising Provider:   WONDA SHARPER [[id number]]    Patient Instructions  Medication Instructions:  Your physician has recommended you make the following change in your medication: START Amoxicillin  500 mg, take 4 tablets by mouth 1 hour prior to dental procedures and cleanings.    *If you need a refill on your cardiac  medications before your next appointment, please call your pharmacy*  Lab Work: None needed If you have labs (blood work) drawn today and your tests are completely normal, you will receive your results only by: MyChart Message (if you have MyChart) OR A paper copy in the mail If you have any lab test that is abnormal or we need to change your treatment, we will call you to review the results.  Testing/Procedures: 05/16/24 Your physician has requested that you have an echocardiogram. Echocardiography is a painless test that uses sound waves to create images of your heart. It provides your doctor with information about the size and shape of your heart and how well your hearts chambers and valves are working. This procedure takes approximately one hour. There are no restrictions for this procedure. Please do NOT wear cologne, perfume, aftershave, or lotions (deodorant is allowed). Please arrive 15 minutes prior to your appointment time.  Please note: We ask at that you not bring children with you during ultrasound (echo/ vascular) testing. Due to room size and safety concerns, children are not allowed in the ultrasound rooms during exams. Our front office staff cannot provide observation of children in our lobby area while testing is being conducted. An adult accompanying a patient to their appointment will only  be allowed in the ultrasound room at the discretion of the ultrasound technician under special circumstances. We apologize for any inconvenience.   Follow-Up: At [facility], you and your health needs are our priority.  As part of our continuing mission to provide you with exceptional heart care, our providers are all part of one team.  This team includes your primary Cardiologist (physician) and Advanced Practice Providers or APPs (Physician Assistants and Nurse Practitioners) who all work together to provide you with the care you need, when you need it.  Your next appointment:   As scheduled on 05/16/24  Provider:   Dr. Lurena Red  We recommend signing up for the patient portal called MyChart.  Sign up information is provided on this After Visit Summary.  MyChart is used to connect with patients for Virtual Visits (Telemedicine).  Patients are able to view lab/test results, encounter notes, upcoming appointments, etc.  Non-urgent messages can be sent to your provider as well.   To learn more about what you can do with MyChart, go to [email or web address].          Signed, Lamarr Hummer, PA-C  04/18/2024 12:52 PM    [facility]     [1]  Current Meds  Medication Sig   amoxicillin  (AMOXIL ) 500 MG tablet Take 4 tablets (2,000 mg total) by mouth as directed. 1 hour prior to dental work including cleanings   aspirin  EC 81 MG tablet Take 1 tablet (81 mg total) by mouth daily. Swallow whole.   DULoxetine  (CYMBALTA ) 60 MG capsule Take 60 mg by mouth daily.   ezetimibe  (ZETIA ) 10 MG tablet TAKE 1 TABLET(10 MG) BY MOUTH DAILY   gabapentin  (NEURONTIN ) 100 MG capsule Take 100 mg by mouth daily as needed (Nerve Pain).   L-Methylfolate-B6-B12 (FOLTANX PO) Take 1 capsule by mouth daily.   metoprolol  succinate (TOPROL  XL) 25 MG 24 hr tablet Take 0.5 tablets (12.5 mg total) by mouth daily.   Multiple Vitamin (MULTI VITAMIN) TABS Take 1 tablet by mouth  daily.   rosuvastatin  (CRESTOR ) 20 MG tablet Take 20 mg by mouth daily.   "

## 2024-04-18 NOTE — Patient Instructions (Signed)
 Medication Instructions:  Your physician has recommended you make the following change in your medication: START Amoxicillin  500 mg, take 4 tablets by mouth 1 hour prior to dental procedures and cleanings.    *If you need a refill on your cardiac medications before your next appointment, please call your pharmacy*  Lab Work: None needed If you have labs (blood work) drawn today and your tests are completely normal, you will receive your results only by: MyChart Message (if you have MyChart) OR A paper copy in the mail If you have any lab test that is abnormal or we need to change your treatment, we will call you to review the results.  Testing/Procedures: 05/16/24 Your physician has requested that you have an echocardiogram. Echocardiography is a painless test that uses sound waves to create images of your heart. It provides your doctor with information about the size and shape of your heart and how well your hearts chambers and valves are working. This procedure takes approximately one hour. There are no restrictions for this procedure. Please do NOT wear cologne, perfume, aftershave, or lotions (deodorant is allowed). Please arrive 15 minutes prior to your appointment time.  Please note: We ask at that you not bring children with you during ultrasound (echo/ vascular) testing. Due to room size and safety concerns, children are not allowed in the ultrasound rooms during exams. Our front office staff cannot provide observation of children in our lobby area while testing is being conducted. An adult accompanying a patient to their appointment will only be allowed in the ultrasound room at the discretion of the ultrasound technician under special circumstances. We apologize for any inconvenience.   Follow-Up: At [facility], you and your health needs are our priority.  As part of our continuing mission to provide you with exceptional heart care, our providers are all part of one team.   This team includes your primary Cardiologist (physician) and Advanced Practice Providers or APPs (Physician Assistants and Nurse Practitioners) who all work together to provide you with the care you need, when you need it.  Your next appointment:   As scheduled on 05/16/24  Provider:   Dr. Lurena Red  We recommend signing up for the patient portal called MyChart.  Sign up information is provided on this After Visit Summary.  MyChart is used to connect with patients for Virtual Visits (Telemedicine).  Patients are able to view lab/test results, encounter notes, upcoming appointments, etc.  Non-urgent messages can be sent to your provider as well.   To learn more about what you can do with MyChart, go to [email or web address].

## 2024-04-20 ENCOUNTER — Telehealth (HOSPITAL_COMMUNITY): Payer: Self-pay

## 2024-04-20 NOTE — Telephone Encounter (Signed)
 Called patient to see if he is interested in the Cardiac Rehab Program. Patient expressed interest. Explained scheduling process and went over insurance process. Patient verbalized understanding. Will contact patient for scheduling once f/u has been completed.

## 2024-04-28 ENCOUNTER — Encounter (HOSPITAL_COMMUNITY)
Admission: RE | Admit: 2024-04-28 | Discharge: 2024-04-28 | Disposition: A | Discharge status: Home / Self Care | Source: Ambulatory Visit | Attending: Internal Medicine | Admitting: Internal Medicine

## 2024-04-28 ENCOUNTER — Telehealth (HOSPITAL_COMMUNITY): Payer: Self-pay

## 2024-04-28 VITALS — BP 124/70 | HR 72 | Ht 70.0 in | Wt 214.9 lb

## 2024-04-28 DIAGNOSIS — Z48812 Encounter for surgical aftercare following surgery on the circulatory system: Secondary | ICD-10-CM | POA: Diagnosis present

## 2024-04-28 DIAGNOSIS — Z952 Presence of prosthetic heart valve: Secondary | ICD-10-CM | POA: Insufficient documentation

## 2024-04-28 NOTE — Telephone Encounter (Signed)
 Called patient to see if he was interested in participating in the Cardiac Rehab Program. Patient will come in for orientation on 1/29 and will attend the 12:30 exercise class.  Patient will arrive this afternoon after taking a last minute cancellation spot.   Pt insurance is active and benefits verified through [facility]. Co-pay $0, DED $0/$0 met, out of pocket $4,200/$0 met, co-insurance 0%. No pre-authorization required. Passport, 04/28/2024 @ 10:12am, REF# [id number].   TCR/ICR? ICR Visit(date of service)limitation? No Can multiple codes be used on the same date of service/visit?(IF ITS A LIMIT) N/A   Is this a lifetime maximum or an annual maximum? Annual Has the member used any of these services to date? No Is there a time limit (weeks/months) on start of program and/or program completion? No

## 2024-04-28 NOTE — Progress Notes (Signed)
 Cardiac Rehab Medication Review   Does the patient  feel that his/her medications are working for him/her?  yes  Has the patient been experiencing any side effects to the medications prescribed?  no  Does the patient measure his/her own blood pressure or blood glucose at home?  yes   Does the patient have any problems obtaining medications due to transportation or finances?   no  Understanding of regimen: excellent Understanding of indications: excellent Potential of compliance: excellent    Comments: patient has a good understanding of his medications and checks his BP regularly. Reading have been better recently. Good reading today      Jeffrey Ewing Finder 04/28/2024 3:36 PM

## 2024-04-28 NOTE — Progress Notes (Signed)
 Cardiac Individual Treatment Plan  Patient Details  Name: Jeffrey Ewing MRN: [id number] Date of Birth: 1940/07/13 Referring Provider:   Flowsheet Row INTENSIVE CARDIAC REHAB ORIENT from 04/28/2024 in [facility]  Referring Provider Dr. 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 Wonda and Dr Daniel   TIA (transient ischemic attack) 2005    Tobacco Use: Tobacco Use History[2]  Labs: Review Flowsheet  More data may exist      Latest Ref Rng & Units 04/19/2020 01/06/2023 01/20/2024 02/23/2024 04/12/2024  Labs for ITP Cardiac and Pulmonary Rehab  Cholestrol 100 - 199 mg/dL - 869  866  - -  LDL (calc) 0 - 99 mg/dL - 50  55  - -  HDL-C >60 mg/dL - 63  57  - -  Trlycerides 0 - 149 mg/dL - 91  878  - -  PH, Arterial 7.35 - 7.45 - - - 7.412  -  PCO2 arterial 32 - 48 mmHg - - - 39.5  -  Bicarbonate 20.0 - 28.0 mmol/L 20.0 - 28.0 mmol/L - - - 25.1  25.7  -  TCO2 22 - 32 mmol/L 29  - - 26  27  21    O2 Saturation % % - - - 94  73  -    Details       Multiple values from one day are sorted in reverse-chronological order         Capillary Blood Glucose: No results found for: GLUCAP   Exercise Target Goals: Exercise  Program Goal: Individual exercise prescription set using results from initial 6 min walk test and THRR while considering  patients activity barriers and safety.   Exercise Prescription Goal: Initial exercise prescription builds to 30-45 minutes a day of aerobic activity, 2-3 days per week.  Home exercise guidelines will be given to patient during program as part of exercise prescription that the participant will acknowledge.  Activity Barriers & Risk Stratification:  Activity Barriers & Cardiac Risk Stratification - 04/28/24 1526       Activity Barriers & Cardiac Risk Stratification   Activity Barriers Balance Concerns;Deconditioning;Right Knee Replacement;Left Knee Replacement    Cardiac Risk Stratification High          6 Minute Walk:  6 Minute Walk     Row Name 04/28/24 1518         6 Minute Walk   Phase Initial     Distance 1358 feet     Walk Time 6 minutes     # of Rest Breaks 0     MPH 2.57     METS 2.17     RPE 11     Perceived Dyspnea  0  VO2 Peak 7.6     Symptoms Yes (comment)     Comments PVC's, no symptoms     Resting HR 72 bpm     Resting BP 124/70     Resting Oxygen Saturation  95 %     Exercise Oxygen Saturation  during 6 min walk 97 %     Max Ex. HR 99 bpm     Max Ex. BP 138/70     2 Minute Post BP 122/70        Oxygen Initial Assessment:   Oxygen Re-Evaluation:   Oxygen Discharge (Final Oxygen Re-Evaluation):   Initial Exercise Prescription:  Initial Exercise Prescription - 04/28/24 1500       Date of Initial Exercise RX and Referring Provider   Date 04/28/24    Referring Provider Dr. Lurena Red MD    Expected Discharge Date 07/20/24      Treadmill   MPH 1.6    Grade 0    Minutes 15    METs 2.23      NuStep   Level 1    SPM 75    Minutes 15    METs 1.9      Prescription Details   Frequency (times per week) 3    Duration Progress to 30 minutes of continuous aerobic without signs/symptoms of physical distress       Intensity   THRR 40-80% of Max Heartrate 55-110    Ratings of Perceived Exertion 11-13    Perceived Dyspnea 0-4      Progression   Progression Continue progressive overload as per policy without signs/symptoms or physical distress.      Resistance Training   Training Prescription Yes    Weight 3    Reps 10-15          Perform Capillary Blood Glucose checks as needed.  Exercise Prescription Changes:   Exercise Comments:   Exercise Goals and Review:   Exercise Goals     Row Name 04/28/24 1529             Exercise Goals   Increase Physical Activity Yes       Intervention Provide advice, education, support and counseling about physical activity/exercise needs.;Develop an individualized exercise prescription for aerobic and resistive training based on initial evaluation findings, risk stratification, comorbidities and participant's personal goals.       Expected Outcomes Short Term: Attend rehab on a regular basis to increase amount of physical activity.;Long Term: Exercising regularly at least 3-5 days a week.;Long Term: Add in home exercise to make exercise part of routine and to increase amount of physical activity.       Increase Strength and Stamina Yes       Intervention Provide advice, education, support and counseling about physical activity/exercise needs.;Develop an individualized exercise prescription for aerobic and resistive training based on initial evaluation findings, risk stratification, comorbidities and participant's personal goals.       Expected Outcomes Short Term: Increase workloads from initial exercise prescription for resistance, speed, and METs.;Short Term: Perform resistance training exercises routinely during rehab and add in resistance training at home;Long Term: Improve cardiorespiratory fitness, muscular endurance and strength as measured by increased METs and functional capacity ( )       Able to understand and use rate of perceived exertion  (RPE) scale Yes       Intervention Provide education and explanation on how to use RPE scale       Expected Outcomes Short Term: Able to  use RPE daily in rehab to express subjective intensity level;Long Term:  Able to use RPE to guide intensity level when exercising independently       Knowledge and understanding of Target Heart Rate Range (THRR) Yes       Intervention Provide education and explanation of THRR including how the numbers were predicted and where they are located for reference       Expected Outcomes Short Term: Able to state/look up THRR;Long Term: Able to use THRR to govern intensity when exercising independently;Short Term: Able to use daily as guideline for intensity in rehab       Understanding of Exercise Prescription Yes       Intervention Provide education, explanation, and written materials on patient's individual exercise prescription       Expected Outcomes Short Term: Able to explain program exercise prescription;Long Term: Able to explain home exercise prescription to exercise independently          Exercise Goals Re-Evaluation :   Discharge Exercise Prescription (Final Exercise Prescription Changes):   Nutrition:  Target Goals: Understanding of nutrition guidelines, daily intake of sodium 1500mg , cholesterol 200mg , calories 30% from fat and 7% or less from saturated fats, daily to have 5 or more servings of fruits and vegetables.  Biometrics:  Pre Biometrics - 04/28/24 1257       Pre Biometrics   Waist Circumference 46.75 inches    Hip Circumference 45 inches    Waist to Hip Ratio 1.04 %    Triceps Skinfold 24 mm    % Body Fat 34.1 %    Grip Strength 23 kg    Flexibility --   Unable to reach   Single Leg Stand 30 seconds           Nutrition Therapy Plan and Nutrition Goals:   Nutrition Assessments:  MEDIFICTS Score Key: >=70 Need to make dietary changes  40-70 Heart Healthy Diet <= 40 Therapeutic Level Cholesterol Diet    Picture Your  Plate Scores: <59 Unhealthy dietary pattern with much room for improvement. 41-50 Dietary pattern unlikely to meet recommendations for good health and room for improvement. 51-60 More healthful dietary pattern, with some room for improvement.  >60 Healthy dietary pattern, although there may be some specific behaviors that could be improved.    Nutrition Goals Re-Evaluation:   Nutrition Goals Re-Evaluation:   Nutrition Goals Discharge (Final Nutrition Goals Re-Evaluation):   Psychosocial: Target Goals: Acknowledge presence or absence of significant depression and/or stress, maximize coping skills, provide positive support system. Participant is able to verbalize types and ability to use techniques and skills needed for reducing stress and depression.  Initial Review & Psychosocial Screening:  Initial Psych Review & Screening - 04/28/24 1530       Initial Review   Current issues with None Identified      Family Dynamics   Good Support System? Yes   pt has good support. No needs at this time     Screening Interventions   Interventions Encouraged to exercise;To provide support and resources with identified psychosocial needs;Provide feedback about the scores to participant    Expected Outcomes Long Term Goal: Stressors or current issues are controlled or eliminated.;Short Term goal: Identification and review with participant of any Quality of Life or Depression concerns found by scoring the questionnaire.;Long Term goal: The participant improves quality of Life and PHQ9 Scores as seen by post scores and/or verbalization of changes          Quality of  Life Scores:  Quality of Life - 04/28/24 1531       Quality of Life   Select Quality of Life      Quality of Life Scores   Health/Function Pre 27.13 %    Socioeconomic Pre 26.25 %    Psych/Spiritual Pre 26.14 %    Family Pre 30 %    GLOBAL Pre 27.2 %         Scores of 19 and below usually indicate a poorer quality of  life in these areas.  A difference of  2-3 points is a clinically meaningful difference.  A difference of 2-3 points in the total score of the Quality of Life Index has been associated with significant improvement in overall quality of life, self-image, physical symptoms, and general health in studies assessing change in quality of life.  PHQ-9: Review Flowsheet       04/28/2024 03/10/2024  Depression screen PHQ 2/9  Decreased Interest 0 0  Down, Depressed, Hopeless 0 0  PHQ - 2 Score 0 0  Altered sleeping 0 -  Tired, decreased energy 0 -  Change in appetite 0 -  Feeling bad or failure about yourself  0 -  Trouble concentrating 0 -  Moving slowly or fidgety/restless 0 -  Suicidal thoughts 0 -  PHQ-9 Score 0 -  Difficult doing work/chores Not difficult at all -   Interpretation of Total Score  Total Score Depression Severity:  1-4 = Minimal depression, 5-9 = Mild depression, 10-14 = Moderate depression, 15-19 = Moderately severe depression, 20-27 = Severe depression   Psychosocial Evaluation and Intervention:   Psychosocial Re-Evaluation:   Psychosocial Discharge (Final Psychosocial Re-Evaluation):   Vocational Rehabilitation: Provide vocational rehab assistance to qualifying candidates.   Vocational Rehab Evaluation & Intervention:  Vocational Rehab - 04/28/24 1533       Initial Vocational Rehab Evaluation & Intervention   Assessment shows need for Vocational Rehabilitation No   Retired         Education: Education Goals: Education classes will be provided on a weekly basis, covering required topics. Participant will state understanding/return demonstration of topics presented.     Core Videos: Exercise    Move It!  Clinical staff conducted group or individual video education with verbal and written material and guidebook.  Patient learns the recommended Pritikin exercise program. Exercise with the goal of living a long, healthy life. Some of the health  benefits of exercise include controlled diabetes, healthier blood pressure levels, improved cholesterol levels, improved heart and lung capacity, improved sleep, and better body composition. Everyone should speak with their doctor before starting or changing an exercise routine.  Biomechanical Limitations Clinical staff conducted group or individual video education with verbal and written material and guidebook.  Patient learns how biomechanical limitations can impact exercise and how we can mitigate and possibly overcome limitations to have an impactful and balanced exercise routine.  Body Composition Clinical staff conducted group or individual video education with verbal and written material and guidebook.  Patient learns that body composition (ratio of muscle mass to fat mass) is a key component to assessing overall fitness, rather than body weight alone. Increased fat mass, especially visceral belly fat, can put us  at increased risk for metabolic syndrome, type 2 diabetes, heart disease, and even death. It is recommended to combine diet and exercise (cardiovascular and resistance training) to improve your body composition. Seek guidance from your physician and exercise physiologist before implementing an exercise routine.  Exercise Action Plan  Clinical staff conducted group or individual video education with verbal and written material and guidebook.  Patient learns the recommended strategies to achieve and enjoy long-term exercise adherence, including variety, self-motivation, self-efficacy, and positive decision making. Benefits of exercise include fitness, good health, weight management, more energy, better sleep, less stress, and overall well-being.  Medical   Heart Disease Risk Reduction Clinical staff conducted group or individual video education with verbal and written material and guidebook.  Patient learns our heart is our most vital organ as it circulates oxygen, nutrients, white  blood cells, and hormones throughout the entire body, and carries waste away. Data supports a plant-based eating plan like the Pritikin Program for its effectiveness in slowing progression of and reversing heart disease. The video provides a number of recommendations to address heart disease.   Metabolic Syndrome and Belly Fat  Clinical staff conducted group or individual video education with verbal and written material and guidebook.  Patient learns what metabolic syndrome is, how it leads to heart disease, and how one can reverse it and keep it from coming back. You have metabolic syndrome if you have 3 of the following 5 criteria: abdominal obesity, high blood pressure, high triglycerides, low HDL cholesterol, and high blood sugar.  Hypertension and Heart Disease Clinical staff conducted group or individual video education with verbal and written material and guidebook.  Patient learns that high blood pressure, or hypertension, is very common in the [place] . Hypertension is largely due to excessive salt intake, but other important risk factors include being overweight, physical inactivity, drinking too much alcohol, smoking, and not eating enough potassium from fruits and vegetables. High blood pressure is a leading risk factor for heart attack, stroke, congestive heart failure, dementia, kidney failure, and premature death. Long-term effects of excessive salt intake include stiffening of the arteries and thickening of heart muscle and organ damage. Recommendations include ways to reduce hypertension and the risk of heart disease.  Diseases of Our Time - Focusing on Diabetes Clinical staff conducted group or individual video education with verbal and written material and guidebook.  Patient learns why the best way to stop diseases of our time is prevention, through food and other lifestyle changes. Medicine (such as prescription pills and surgeries) is often only a Band-Aid on the problem, not a  long-term solution. Most common diseases of our time include obesity, type 2 diabetes, hypertension, heart disease, and cancer. The Pritikin Program is recommended and has been proven to help reduce, reverse, and/or prevent the damaging effects of metabolic syndrome.  Nutrition   Overview of the Pritikin Eating Plan  Clinical staff conducted group or individual video education with verbal and written material and guidebook.  Patient learns about the Pritikin Eating Plan for disease risk reduction. The Pritikin Eating Plan emphasizes a wide variety of unrefined, minimally-processed carbohydrates, like fruits, vegetables, whole grains, and legumes. Go, Caution, and Stop food choices are explained. Plant-based and lean animal proteins are emphasized. Rationale provided for low sodium intake for blood pressure control, low added sugars for blood sugar stabilization, and low added fats and oils for coronary artery disease risk reduction and weight management.  Calorie Density  Clinical staff conducted group or individual video education with verbal and written material and guidebook.  Patient learns about calorie density and how it impacts the Pritikin Eating Plan. Knowing the characteristics of the food you choose will help you decide whether those foods will lead to weight gain or weight loss, and whether you want to  consume more or less of them. Weight loss is usually a side effect of the Pritikin Eating Plan because of its focus on low calorie-dense foods.  Label Reading  Clinical staff conducted group or individual video education with verbal and written material and guidebook.  Patient learns about the Pritikin recommended label reading guidelines and corresponding recommendations regarding calorie density, added sugars, sodium content, and whole grains.  Dining Out - Part 1  Clinical staff conducted group or individual video education with verbal and written material and guidebook.  Patient learns  that restaurant meals can be sabotaging because they can be so high in calories, fat, sodium, and/or sugar. Patient learns recommended strategies on how to positively address this and avoid unhealthy pitfalls.  Facts on Fats  Clinical staff conducted group or individual video education with verbal and written material and guidebook.  Patient learns that lifestyle modifications can be just as effective, if not more so, as many medications for lowering your risk of heart disease. A Pritikin lifestyle can help to reduce your risk of inflammation and atherosclerosis (cholesterol build-up, or plaque, in the artery walls). Lifestyle interventions such as dietary choices and physical activity address the cause of atherosclerosis. A review of the types of fats and their impact on blood cholesterol levels, along with dietary recommendations to reduce fat intake is also included.  Nutrition Action Plan  Clinical staff conducted group or individual video education with verbal and written material and guidebook.  Patient learns how to incorporate Pritikin recommendations into their lifestyle. Recommendations include planning and keeping personal health goals in mind as an important part of their success.  Healthy Mind-Set    Healthy Minds, Bodies, Hearts  Clinical staff conducted group or individual video education with verbal and written material and guidebook.  Patient learns how to identify when they are stressed. Video will discuss the impact of that stress, as well as the many benefits of stress management. Patient will also be introduced to stress management techniques. The way we think, act, and feel has an impact on our hearts.  How Our Thoughts Can Heal Our Hearts  Clinical staff conducted group or individual video education with verbal and written material and guidebook.  Patient learns that negative thoughts can cause depression and anxiety. This can result in negative lifestyle behavior and serious  health problems. Cognitive behavioral therapy is an effective method to help control our thoughts in order to change and improve our emotional outlook.  Additional Videos:  Exercise    Improving Performance  Clinical staff conducted group or individual video education with verbal and written material and guidebook.  Patient learns to use a non-linear approach by alternating intensity levels and lengths of time spent exercising to help burn more calories and lose more body fat. Cardiovascular exercise helps improve heart health, metabolism, hormonal balance, blood sugar control, and recovery from fatigue. Resistance training improves strength, endurance, balance, coordination, reaction time, metabolism, and muscle mass. Flexibility exercise improves circulation, posture, and balance. Seek guidance from your physician and exercise physiologist before implementing an exercise routine and learn your capabilities and proper form for all exercise.  Introduction to Yoga  Clinical staff conducted group or individual video education with verbal and written material and guidebook.  Patient learns about yoga, a discipline of the coming together of mind, breath, and body. The benefits of yoga include improved flexibility, improved range of motion, better posture and core strength, increased lung function, weight loss, and positive self-image. Yogas heart health benefits include  lowered blood pressure, healthier heart rate, decreased cholesterol and triglyceride levels, improved immune function, and reduced stress. Seek guidance from your physician and exercise physiologist before implementing an exercise routine and learn your capabilities and proper form for all exercise.  Medical   Aging: Enhancing Your Quality of Life  Clinical staff conducted group or individual video education with verbal and written material and guidebook.  Patient learns key strategies and recommendations to stay in good physical health  and enhance quality of life, such as prevention strategies, having an advocate, securing a Health Care Proxy and Power of Attorney, and keeping a list of medications and system for tracking them. It also discusses how to avoid risk for bone loss.  Biology of Weight Control  Clinical staff conducted group or individual video education with verbal and written material and guidebook.  Patient learns that weight gain occurs because we consume more calories than we burn (eating more, moving less). Even if your body weight is normal, you may have higher ratios of fat compared to muscle mass. Too much body fat puts you at increased risk for cardiovascular disease, heart attack, stroke, type 2 diabetes, and obesity-related cancers. In addition to exercise, following the Pritikin Eating Plan can help reduce your risk.  Decoding Lab Results  Clinical staff conducted group or individual video education with verbal and written material and guidebook.  Patient learns that lab test reflects one measurement whose values change over time and are influenced by many factors, including medication, stress, sleep, exercise, food, hydration, pre-existing medical conditions, and more. It is recommended to use the knowledge from this video to become more involved with your lab results and evaluate your numbers to speak with your doctor.   Diseases of Our Time - Overview  Clinical staff conducted group or individual video education with verbal and written material and guidebook.  Patient learns that according to the CDC, 50% to 70% of chronic diseases (such as obesity, type 2 diabetes, elevated lipids, hypertension, and heart disease) are avoidable through lifestyle improvements including healthier food choices, listening to satiety cues, and increased physical activity.  Sleep Disorders Clinical staff conducted group or individual video education with verbal and written material and guidebook.  Patient learns how good  quality and duration of sleep are important to overall health and well-being. Patient also learns about sleep disorders and how they impact health along with recommendations to address them, including discussing with a physician.  Nutrition  Dining Out - Part 2 Clinical staff conducted group or individual video education with verbal and written material and guidebook.  Patient learns how to plan ahead and communicate in order to maximize their dining experience in a healthy and nutritious manner. Included are recommended food choices based on the type of restaurant the patient is visiting.   Fueling a Banker conducted group or individual video education with verbal and written material and guidebook.  There is a strong connection between our food choices and our health. Diseases like obesity and type 2 diabetes are very prevalent and are in large-part due to lifestyle choices. The Pritikin Eating Plan provides plenty of food and hunger-curbing satisfaction. It is easy to follow, affordable, and helps reduce health risks.  Menu Workshop  Clinical staff conducted group or individual video education with verbal and written material and guidebook.  Patient learns that restaurant meals can sabotage health goals because they are often packed with calories, fat, sodium, and sugar. Recommendations include strategies to plan ahead  and to communicate with the manager, chef, or server to help order a healthier meal.  Planning Your Eating Strategy  Clinical staff conducted group or individual video education with verbal and written material and guidebook.  Patient learns about the Pritikin Eating Plan and its benefit of reducing the risk of disease. The Pritikin Eating Plan does not focus on calories. Instead, it emphasizes high-quality, nutrient-rich foods. By knowing the characteristics of the foods, we choose, we can determine their calorie density and make informed  decisions.  Targeting Your Nutrition Priorities  Clinical staff conducted group or individual video education with verbal and written material and guidebook.  Patient learns that lifestyle habits have a tremendous impact on disease risk and progression. This video provides eating and physical activity recommendations based on your personal health goals, such as reducing LDL cholesterol, losing weight, preventing or controlling type 2 diabetes, and reducing high blood pressure.  Vitamins and Minerals  Clinical staff conducted group or individual video education with verbal and written material and guidebook.  Patient learns different ways to obtain key vitamins and minerals, including through a recommended healthy diet. It is important to discuss all supplements you take with your doctor.   Healthy Mind-Set    Smoking Cessation  Clinical staff conducted group or individual video education with verbal and written material and guidebook.  Patient learns that cigarette smoking and tobacco addiction pose a serious health risk which affects millions of people. Stopping smoking will significantly reduce the risk of heart disease, lung disease, and many forms of cancer. Recommended strategies for quitting are covered, including working with your doctor to develop a successful plan.  Culinary   Becoming a Set Designer conducted group or individual video education with verbal and written material and guidebook.  Patient learns that cooking at home can be healthy, cost-effective, quick, and puts them in control. Keys to cooking healthy recipes will include looking at your recipe, assessing your equipment needs, planning ahead, making it simple, choosing cost-effective seasonal ingredients, and limiting the use of added fats, salts, and sugars.  Cooking - Breakfast and Snacks  Clinical staff conducted group or individual video education with verbal and written material and guidebook.   Patient learns how important breakfast is to satiety and nutrition through the entire day. Recommendations include key foods to eat during breakfast to help stabilize blood sugar levels and to prevent overeating at meals later in the day. Planning ahead is also a key component.  Cooking - Educational Psychologist conducted group or individual video education with verbal and written material and guidebook.  Patient learns eating strategies to improve overall health, including an approach to cook more at home. Recommendations include thinking of animal protein as a side on your plate rather than center stage and focusing instead on lower calorie dense options like vegetables, fruits, whole grains, and plant-based proteins, such as beans. Making sauces in large quantities to freeze for later and leaving the skin on your vegetables are also recommended to maximize your experience.  Cooking - Healthy Salads and Dressing Clinical staff conducted group or individual video education with verbal and written material and guidebook.  Patient learns that vegetables, fruits, whole grains, and legumes are the foundations of the Pritikin Eating Plan. Recommendations include how to incorporate each of these in flavorful and healthy salads, and how to create homemade salad dressings. Proper handling of ingredients is also covered. Cooking - Soups and Desserts  Cooking - Soups and  Desserts Clinical staff conducted group or individual video education with verbal and written material and guidebook.  Patient learns that Pritikin soups and desserts make for easy, nutritious, and delicious snacks and meal components that are low in sodium, fat, sugar, and calorie density, while high in vitamins, minerals, and filling fiber. Recommendations include simple and healthy ideas for soups and desserts.   Overview     The Pritikin Solution Program Overview Clinical staff conducted group or individual video education with  verbal and written material and guidebook.  Patient learns that the results of the Pritikin Program have been documented in more than 100 articles published in peer-reviewed journals, and the benefits include reducing risk factors for (and, in some cases, even reversing) high cholesterol, high blood pressure, type 2 diabetes, obesity, and more! An overview of the three key pillars of the Pritikin Program will be covered: eating well, doing regular exercise, and having a healthy mind-set.  WORKSHOPS  Exercise: Exercise Basics: Building Your Action Plan Clinical staff led group instruction and group discussion with PowerPoint presentation and patient guidebook. To enhance the learning environment the use of posters, models and videos may be added. At the conclusion of this workshop, patients will comprehend the difference between physical activity and exercise, as well as the benefits of incorporating both, into their routine. Patients will understand the FITT (Frequency, Intensity, Time, and Type) principle and how to use it to build an exercise action plan. In addition, safety concerns and other considerations for exercise and cardiac rehab will be addressed by the presenter. The purpose of this lesson is to promote a comprehensive and effective weekly exercise routine in order to improve patients overall level of fitness.   Managing Heart Disease: Your Path to a Healthier Heart Clinical staff led group instruction and group discussion with PowerPoint presentation and patient guidebook. To enhance the learning environment the use of posters, models and videos may be added.At the conclusion of this workshop, patients will understand the anatomy and physiology of the heart. Additionally, they will understand how Pritikins three pillars impact the risk factors, the progression, and the management of heart disease.  The purpose of this lesson is to provide a high-level overview of the heart, heart  disease, and how the Pritikin lifestyle positively impacts risk factors.  Exercise Biomechanics Clinical staff led group instruction and group discussion with PowerPoint presentation and patient guidebook. To enhance the learning environment the use of posters, models and videos may be added. Patients will learn how the structural parts of their bodies function and how these functions impact their daily activities, movement, and exercise. Patients will learn how to promote a neutral spine, learn how to manage pain, and identify ways to improve their physical movement in order to promote healthy living. The purpose of this lesson is to expose patients to common physical limitations that impact physical activity. Participants will learn practical ways to adapt and manage aches and pains, and to minimize their effect on regular exercise. Patients will learn how to maintain good posture while sitting, walking, and lifting.  Balance Training and Fall Prevention  Clinical staff led group instruction and group discussion with PowerPoint presentation and patient guidebook. To enhance the learning environment the use of posters, models and videos may be added. At the conclusion of this workshop, patients will understand the importance of their sensorimotor skills (vision, proprioception, and the vestibular system) in maintaining their ability to balance as they age. Patients will apply a variety of balancing exercises that  are appropriate for their current level of function. Patients will understand the common causes for poor balance, possible solutions to these problems, and ways to modify their physical environment in order to minimize their fall risk. The purpose of this lesson is to teach patients about the importance of maintaining balance as they age and ways to minimize their risk of falling.  WORKSHOPS   Nutrition:  Fueling a Ship Broker led group instruction and group  discussion with PowerPoint presentation and patient guidebook. To enhance the learning environment the use of posters, models and videos may be added. Patients will review the foundational principles of the Pritikin Eating Plan and understand what constitutes a serving size in each of the food groups. Patients will also learn Pritikin-friendly foods that are better choices when away from home and review make-ahead meal and snack options. Calorie density will be reviewed and applied to three nutrition priorities: weight maintenance, weight loss, and weight gain. The purpose of this lesson is to reinforce (in a group setting) the key concepts around what patients are recommended to eat and how to apply these guidelines when away from home by planning and selecting Pritikin-friendly options. Patients will understand how calorie density may be adjusted for different weight management goals.  Mindful Eating  Clinical staff led group instruction and group discussion with PowerPoint presentation and patient guidebook. To enhance the learning environment the use of posters, models and videos may be added. Patients will briefly review the concepts of the Pritikin Eating Plan and the importance of low-calorie dense foods. The concept of mindful eating will be introduced as well as the importance of paying attention to internal hunger signals. Triggers for non-hunger eating and techniques for dealing with triggers will be explored. The purpose of this lesson is to provide patients with the opportunity to review the basic principles of the Pritikin Eating Plan, discuss the value of eating mindfully and how to measure internal cues of hunger and fullness using the Hunger Scale. Patients will also discuss reasons for non-hunger eating and learn strategies to use for controlling emotional eating.  Targeting Your Nutrition Priorities Clinical staff led group instruction and group discussion with PowerPoint presentation and  patient guidebook. To enhance the learning environment the use of posters, models and videos may be added. Patients will learn how to determine their genetic susceptibility to disease by reviewing their family history. Patients will gain insight into the importance of diet as part of an overall healthy lifestyle in mitigating the impact of genetics and other environmental insults. The purpose of this lesson is to provide patients with the opportunity to assess their personal nutrition priorities by looking at their family history, their own health history and current risk factors. Patients will also be able to discuss ways of prioritizing and modifying the Pritikin Eating Plan for their highest risk areas  Menu  Clinical staff led group instruction and group discussion with PowerPoint presentation and patient guidebook. To enhance the learning environment the use of posters, models and videos may be added. Using menus brought in from [employer], or printed from [employer], patients will apply the Pritikin dining out guidelines that were presented in the [employer] video. Patients will also be able to practice these guidelines in a variety of provided scenarios. The purpose of this lesson is to provide patients with the opportunity to practice hands-on learning of the Pritikin Dining Out guidelines with actual menus and practice scenarios.  Label Reading Clinical staff  led group instruction and group discussion with PowerPoint presentation and patient guidebook. To enhance the learning environment the use of posters, models and videos may be added. Patients will review and discuss the Pritikin label reading guidelines presented in Pritikins Label Reading Educational series video. Using fool labels brought in from local grocery stores and markets, patients will apply the label reading guidelines and determine if the packaged food meet the Pritikin guidelines. The purpose of this  lesson is to provide patients with the opportunity to review, discuss, and practice hands-on learning of the Pritikin Label Reading guidelines with actual packaged food labels. Cooking School  Pritikins [employer] are designed to teach patients ways to prepare quick, simple, and affordable recipes at home. The importance of nutritions role in chronic disease risk reduction is reflected in its emphasis in the overall Pritikin program. By learning how to prepare essential core Pritikin Eating Plan recipes, patients will increase control over what they eat; be able to customize the flavor of foods without the use of added salt, sugar, or fat; and improve the quality of the food they consume. By learning a set of core recipes which are easily assembled, quickly prepared, and affordable, patients are more likely to prepare more healthy foods at home. These workshops focus on convenient breakfasts, simple entres, side dishes, and desserts which can be prepared with minimal effort and are consistent with nutrition recommendations for cardiovascular risk reduction. Cooking [employer] are taught by a armed forces logistics/support/administrative officer (RD) who has been trained by the [employer]. The chef or RD has a clear understanding of the importance of minimizing - if not completely eliminating - added fat, sugar, and sodium in recipes. Throughout the series of Cooking School Workshop sessions, patients will learn about healthy ingredients and efficient methods of cooking to build confidence in their capability to prepare    Cooking School weekly topics:  Adding Flavor- Sodium-Free  Fast and Healthy Breakfasts  Powerhouse Plant-Based Proteins  Satisfying Salads and Dressings  Simple Sides and Sauces  International Cuisine-Spotlight on the [employer] Zones  Delicious Desserts  Savory Soups  [employer] - Meals in a Astronomer Appetizers and Snacks  Comforting Weekend Breakfasts  One-Pot  Wonders   Fast Evening Meals  Landscape Architect Your Pritikin Plate  WORKSHOPS   Healthy Mindset (Psychosocial):  Focused Goals, Sustainable Changes Clinical staff led group instruction and group discussion with PowerPoint presentation and patient guidebook. To enhance the learning environment the use of posters, models and videos may be added. Patients will be able to apply effective goal setting strategies to establish at least one personal goal, and then take consistent, meaningful action toward that goal. They will learn to identify common barriers to achieving personal goals and develop strategies to overcome them. Patients will also gain an understanding of how our mind-set can impact our ability to achieve goals and the importance of cultivating a positive and growth-oriented mind-set. The purpose of this lesson is to provide patients with a deeper understanding of how to set and achieve personal goals, as well as the tools and strategies needed to overcome common obstacles which may arise along the way.  From Head to Heart: The Power of a Healthy Outlook  Clinical staff led group instruction and group discussion with PowerPoint presentation and patient guidebook. To enhance the learning environment the use of posters, models and videos may be added. Patients will be able to recognize and describe the impact of  emotions and mood on physical health. They will discover the importance of self-care and explore self-care practices which may work for them. Patients will also learn how to utilize the 4 Cs to cultivate a healthier outlook and better manage stress and challenges. The purpose of this lesson is to demonstrate to patients how a healthy outlook is an essential part of maintaining good health, especially as they continue their cardiac rehab journey.  Healthy Sleep for a Healthy Heart Clinical staff led group instruction and group discussion with PowerPoint presentation and  patient guidebook. To enhance the learning environment the use of posters, models and videos may be added. At the conclusion of this workshop, patients will be able to demonstrate knowledge of the importance of sleep to overall health, well-being, and quality of life. They will understand the symptoms of, and treatments for, common sleep disorders. Patients will also be able to identify daytime and nighttime behaviors which impact sleep, and they will be able to apply these tools to help manage sleep-related challenges. The purpose of this lesson is to provide patients with a general overview of sleep and outline the importance of quality sleep. Patients will learn about a few of the most common sleep disorders. Patients will also be introduced to the concept of sleep hygiene, and discover ways to self-manage certain sleeping problems through simple daily behavior changes. Finally, the workshop will motivate patients by clarifying the links between quality sleep and their goals of heart-healthy living.   Recognizing and Reducing Stress Clinical staff led group instruction and group discussion with PowerPoint presentation and patient guidebook. To enhance the learning environment the use of posters, models and videos may be added. At the conclusion of this workshop, patients will be able to understand the types of stress reactions, differentiate between acute and chronic stress, and recognize the impact that chronic stress has on their health. They will also be able to apply different coping mechanisms, such as reframing negative self-talk. Patients will have the opportunity to practice a variety of stress management techniques, such as deep abdominal breathing, progressive muscle relaxation, and/or guided imagery.  The purpose of this lesson is to educate patients on the role of stress in their lives and to provide healthy techniques for coping with it.  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   Learning Preferences Group Instruction;Individual Instruction;Computer/Internet;Video;Skilled Demonstration          Education Topics:  Knowledge Questionnaire Score:  Knowledge Questionnaire Score - 04/28/24 1533       Knowledge Questionnaire Score   Pre Score 23/24          Core Components/Risk Factors/Patient Goals at Admission:  Personal Goals and Risk Factors at Admission - 04/28/24 1533       Core Components/Risk Factors/Patient Goals on Admission    Weight Management Yes;Weight Loss;Obesity    Intervention Weight Management: Develop a combined nutrition and exercise program designed to reach desired caloric intake, while maintaining appropriate intake of nutrient and fiber, sodium and fats, and appropriate energy expenditure required for the weight goal.;Weight Management: Provide education and appropriate resources to help participant work on and attain dietary goals.;Weight Management/Obesity: Establish reasonable short term and long term weight goals.;Obesity: Provide education and appropriate resources to help participant work on and attain dietary goals.    Expected Outcomes Short Term: Continue to assess and modify  interventions until short term weight is achieved;Long Term: Adherence to nutrition and physical activity/exercise program aimed toward attainment of established weight goal;Weight Loss: Understanding of general recommendations for a balanced deficit meal plan, which promotes 1-2 lb weight loss per week and includes a negative energy balance of [phone] kcal/d;Understanding recommendations for meals to include 15-35% energy as protein, 25-35% energy from fat, 35-60% energy from carbohydrates, less than 200mg  of dietary cholesterol, 20-35 gm of total fiber daily;Understanding of distribution of calorie  intake throughout the day with the consumption of 4-5 meals/snacks    Lipids Yes    Intervention Provide education and support for participant on nutrition & aerobic/resistive exercise along with prescribed medications to achieve LDL 70mg , HDL >40mg .    Expected Outcomes Short Term: Participant states understanding of desired cholesterol values and is compliant with medications prescribed. Participant is following exercise prescription and nutrition guidelines.;Long Term: Cholesterol controlled with medications as prescribed, with individualized exercise RX and with personalized nutrition plan. Value goals: LDL < 70mg , HDL > 40 mg.    Personal Goal Other Yes    Personal Goal ST: know limits to exercise LT wt loss at least 10 lbs, nutrition info, leg strength    Intervention Will continue to monitor pt and progress workloads as tolerated without sign or symptom    Expected Outcomes Pt will achieve his goals          Core Components/Risk Factors/Patient Goals Review:    Core Components/Risk Factors/Patient Goals at Discharge (Final Review):    ITP Comments:  ITP Comments     Row Name 04/28/24 1258           ITP Comments Wilbert Holland, MD: Medical Director. Introduction to the Pritikin Education Program/Intensive Cardiac Rehab. Intial orientation packet reviewed with the patient.          Comments: Participant attended orientation for the cardiac rehabilitation program on  04/28/2024  to perform initial intake and exercise walk test. Patient introduced to the Pritikin Program education and orientation packet was reviewed. Completed 6-minute walk test, measurements, initial ITP, and exercise prescription. Vital signs stable. Telemetry-normal sinus rhythm (known)elevated T-wave and rare PCV's, asymptomatic.   Service time was from 1300 to 1445.        [1]  Current Outpatient Medications:    amoxicillin  (AMOXIL ) 500 MG tablet, Take 4 tablets (2,000 mg total) by mouth as directed. 1  hour prior to dental work including cleanings, Disp: 12 tablet, Rfl: 12   aspirin  EC 81 MG tablet, Take 1 tablet (81 mg total) by mouth daily. Swallow whole., Disp: , Rfl:    DULoxetine  (CYMBALTA ) 60 MG capsule, Take 60 mg by mouth daily., Disp: , Rfl:    ezetimibe  (ZETIA ) 10 MG tablet, TAKE 1 TABLET(10 MG) BY MOUTH DAILY, Disp: 90 tablet, Rfl: 1   gabapentin  (NEURONTIN ) 100 MG capsule, Take 100 mg by mouth daily as needed (Nerve Pain)., Disp: , Rfl:    L-Methylfolate-B6-B12 (FOLTANX PO), Take 1 capsule by mouth daily., Disp: , Rfl:    metoprolol  succinate (TOPROL  XL) 25 MG 24 hr tablet, Take 0.5 tablets (12.5 mg total) by mouth daily., Disp: 60 tablet, Rfl: 2   Multiple Vitamin (MULTI VITAMIN) TABS, Take 1 tablet by mouth daily., Disp: , Rfl:    rosuvastatin  (CRESTOR ) 20 MG tablet, Take 20 mg by mouth daily., Disp: , Rfl:  [2]  Social History Tobacco Use  Smoking Status Former   Current packs/day: 0.00   Average packs/day: 1 pack/day for 41.0 years (  41.0 ttl pk-yrs)   Types: Cigarettes   Start date: 11/05/1962   Quit date: 11/05/2003   Years since quitting: 20.4  Smokeless Tobacco Never

## 2024-05-02 ENCOUNTER — Encounter (HOSPITAL_COMMUNITY)

## 2024-05-04 ENCOUNTER — Encounter (HOSPITAL_COMMUNITY)
Admission: RE | Admit: 2024-05-04 | Discharge: 2024-05-04 | Disposition: A | Source: Ambulatory Visit | Attending: Internal Medicine

## 2024-05-04 DIAGNOSIS — Z952 Presence of prosthetic heart valve: Secondary | ICD-10-CM

## 2024-05-04 NOTE — Progress Notes (Signed)
 Daily Session Note  Patient Details  Name: Jeffrey Ewing MRN: [id number] Date of Birth: 16-Mar-1941 Referring Provider:   Flowsheet Row INTENSIVE CARDIAC REHAB ORIENT from 04/28/2024 in [facility]  Referring Provider Dr. Lurena Red MD    Encounter Date: 05/04/2024  Check In:  Session Check In - 05/04/24 1253       Check-In   Supervising physician immediately available to respond to emergencies [facility] MD immediately available    Physician(s) Orren Fabry, PA-C    Location [facility]    Staff Present Arnoldo Gal, MS, ACSM-CEP, Exercise Physiologist;Maria Whitaker, RN, Valere Music, RN, Mallory Parkins, MS, ACSM-CEP, CCRP, Exercise Physiologist;Other    Virtual Visit No    Medication changes reported     No    Fall or balance concerns reported    No    Tobacco Cessation No Change    Warm-up and Cool-down Performed as group-led instruction   Cardiac Orientation   Resistance Training Performed No    VAD Patient? No      Pain Assessment   Currently in Pain? No/denies    Pain Score 0-No pain    Multiple Pain Sites No          Capillary Blood Glucose: No results found for this or any previous visit (from the past 24 hours).   Exercise Prescription Changes - 05/04/24 1400       Response to Exercise   Blood Pressure (Admit) 124/72    Blood Pressure (Exercise) 136/74    Blood Pressure (Exit) 112/64    Heart Rate (Admit) 81 bpm    Heart Rate (Exercise) 98 bpm    Heart Rate (Exit) 80 bpm    Rating of Perceived Exertion (Exercise) 12    Symptoms None    Comments Pt's first day in the [facility] program    Duration Continue with 30 min of aerobic exercise without signs/symptoms of physical distress.    Intensity THRR unchanged      Progression   Progression Continue to progress workloads to maintain intensity without signs/symptoms of physical distress.    Average METs 1.85      Resistance  Training   Weight No weights on Wednesdays      Interval Training   Interval Training No      Treadmill   MPH 1.6    Grade 0    Minutes 15    METs 2.23      NuStep   Level 1    SPM 68    Minutes 15    METs 1.5          Tobacco Use History[1]  Goals Met:  Exercise tolerated well No report of concerns or symptoms today  Goals Unmet:  Not Applicable  Comments: Pt started cardiac rehab today.  Pt tolerated light exercise without difficulty. VSS, telemetry-sinus rhythm, asymptomatic.  Medication list reconciled. Pt denies barriers to medication compliance.  PSYCHOSOCIAL ASSESSMENT:  PHQ-0. Pt exhibits positive coping skills, hopeful outlook with supportive family. No psychosocial needs identified at this time, no psychosocial interventions necessary.   Pt oriented to exercise equipment and routine.    Understanding verbalized.     Dr. Wilbert Bihari is Medical Director for Cardiac Rehab at [facility].    [1]  Social History Tobacco Use  Smoking Status Former   Current packs/day: 0.00   Average packs/day: 1 pack/day for 41.0 years (41.0 ttl pk-yrs)   Types:  Cigarettes   Start date: 11/05/1962   Quit date: 11/05/2003   Years since quitting: 20.5  Smokeless Tobacco Never

## 2024-05-05 NOTE — Addendum Note (Signed)
 Addended by: JOSUE PEE A on: 05/05/2024 05:05 PM   Modules accepted: Orders

## 2024-05-06 ENCOUNTER — Encounter (HOSPITAL_COMMUNITY): Admission: RE | Admit: 2024-05-06 | Source: Ambulatory Visit

## 2024-05-06 ENCOUNTER — Telehealth (HOSPITAL_COMMUNITY): Payer: Self-pay

## 2024-05-06 DIAGNOSIS — Z952 Presence of prosthetic heart valve: Secondary | ICD-10-CM

## 2024-05-06 NOTE — Telephone Encounter (Signed)
 Patient came to front desk to request moving his cardiac rehab class time from 12:30pm to 11:45pm. Schedule has been updated.

## 2024-05-09 ENCOUNTER — Encounter (HOSPITAL_COMMUNITY)

## 2024-05-11 ENCOUNTER — Encounter (HOSPITAL_COMMUNITY)

## 2024-05-13 ENCOUNTER — Encounter (HOSPITAL_COMMUNITY)

## 2024-05-16 ENCOUNTER — Ambulatory Visit (HOSPITAL_COMMUNITY)

## 2024-05-16 ENCOUNTER — Ambulatory Visit: Admitting: Physician Assistant

## 2024-05-16 ENCOUNTER — Encounter

## 2024-05-16 ENCOUNTER — Ambulatory Visit: Admitting: Internal Medicine

## 2024-05-16 ENCOUNTER — Encounter (HOSPITAL_COMMUNITY)

## 2024-05-18 ENCOUNTER — Encounter (HOSPITAL_COMMUNITY)

## 2024-05-20 ENCOUNTER — Encounter (HOSPITAL_COMMUNITY)

## 2024-05-23 ENCOUNTER — Encounter (HOSPITAL_COMMUNITY)

## 2024-05-25 ENCOUNTER — Encounter (HOSPITAL_COMMUNITY)

## 2024-05-27 ENCOUNTER — Encounter (HOSPITAL_COMMUNITY)

## 2024-05-30 ENCOUNTER — Encounter (HOSPITAL_COMMUNITY)

## 2024-06-01 ENCOUNTER — Encounter (HOSPITAL_COMMUNITY)

## 2024-06-03 ENCOUNTER — Encounter (HOSPITAL_COMMUNITY)

## 2024-06-06 ENCOUNTER — Encounter (HOSPITAL_COMMUNITY)

## 2024-06-08 ENCOUNTER — Encounter (HOSPITAL_COMMUNITY)

## 2024-06-10 ENCOUNTER — Encounter (HOSPITAL_COMMUNITY)

## 2024-06-13 ENCOUNTER — Encounter (HOSPITAL_COMMUNITY)

## 2024-06-15 ENCOUNTER — Encounter (HOSPITAL_COMMUNITY)

## 2024-06-17 ENCOUNTER — Encounter (HOSPITAL_COMMUNITY)

## 2024-06-20 ENCOUNTER — Encounter (HOSPITAL_COMMUNITY)

## 2024-06-22 ENCOUNTER — Encounter (HOSPITAL_COMMUNITY)

## 2024-06-24 ENCOUNTER — Encounter (HOSPITAL_COMMUNITY)

## 2024-06-27 ENCOUNTER — Encounter (HOSPITAL_COMMUNITY)

## 2024-06-29 ENCOUNTER — Encounter (HOSPITAL_COMMUNITY)

## 2024-07-01 ENCOUNTER — Encounter (HOSPITAL_COMMUNITY)

## 2024-07-04 ENCOUNTER — Encounter (HOSPITAL_COMMUNITY)

## 2024-07-06 ENCOUNTER — Encounter (HOSPITAL_COMMUNITY)

## 2024-07-08 ENCOUNTER — Encounter (HOSPITAL_COMMUNITY)

## 2024-07-08 ENCOUNTER — Ambulatory Visit: Admitting: Internal Medicine

## 2024-07-11 ENCOUNTER — Encounter (HOSPITAL_COMMUNITY)

## 2024-07-13 ENCOUNTER — Encounter (HOSPITAL_COMMUNITY)

## 2024-07-15 ENCOUNTER — Encounter (HOSPITAL_COMMUNITY)

## 2024-07-18 ENCOUNTER — Encounter (HOSPITAL_COMMUNITY)

## 2024-07-20 ENCOUNTER — Encounter (HOSPITAL_COMMUNITY)

## 2025-04-10 ENCOUNTER — Ambulatory Visit (HOSPITAL_COMMUNITY)

## 2025-04-10 ENCOUNTER — Ambulatory Visit: Admitting: Physician Assistant
